# Patient Record
Sex: Male | Born: 1970 | Race: White | Hispanic: No | Smoking: Current every day smoker
Health system: Southern US, Community
[De-identification: ages and names within clinical notes are randomized; demographics above are authoritative.]

## PROBLEM LIST (undated history)

## (undated) DIAGNOSIS — M1990 Unspecified osteoarthritis, unspecified site: Secondary | ICD-10-CM

## (undated) DIAGNOSIS — M549 Dorsalgia, unspecified: Secondary | ICD-10-CM

## (undated) DIAGNOSIS — Z8739 Personal history of other diseases of the musculoskeletal system and connective tissue: Secondary | ICD-10-CM

## (undated) DIAGNOSIS — G8929 Other chronic pain: Secondary | ICD-10-CM

## (undated) DIAGNOSIS — Z981 Arthrodesis status: Secondary | ICD-10-CM

## (undated) HISTORY — DX: Other chronic pain: G89.29

## (undated) HISTORY — DX: Personal history of other diseases of the musculoskeletal system and connective tissue: Z87.39

## (undated) HISTORY — DX: Arthrodesis status: Z98.1

## (undated) HISTORY — PX: SPINAL FUSION: SHX223

## (undated) HISTORY — DX: Dorsalgia, unspecified: M54.9

---

## 2003-01-24 ENCOUNTER — Emergency Department (HOSPITAL_COMMUNITY): Admission: EM | Admit: 2003-01-24 | Discharge: 2003-01-24 | Payer: Self-pay | Admitting: Internal Medicine

## 2003-11-07 ENCOUNTER — Emergency Department (HOSPITAL_COMMUNITY): Admission: EM | Admit: 2003-11-07 | Discharge: 2003-11-07 | Payer: Self-pay | Admitting: Emergency Medicine

## 2004-02-05 ENCOUNTER — Emergency Department (HOSPITAL_COMMUNITY): Admission: EM | Admit: 2004-02-05 | Discharge: 2004-02-05 | Payer: Self-pay | Admitting: Emergency Medicine

## 2004-03-15 ENCOUNTER — Encounter: Admission: RE | Admit: 2004-03-15 | Discharge: 2004-03-27 | Payer: Self-pay | Admitting: Emergency Medicine

## 2004-03-17 ENCOUNTER — Emergency Department (HOSPITAL_COMMUNITY): Admission: EM | Admit: 2004-03-17 | Discharge: 2004-03-17 | Payer: Self-pay | Admitting: Emergency Medicine

## 2004-04-15 ENCOUNTER — Emergency Department (HOSPITAL_COMMUNITY): Admission: EM | Admit: 2004-04-15 | Discharge: 2004-04-15 | Payer: Self-pay | Admitting: Emergency Medicine

## 2004-09-12 ENCOUNTER — Ambulatory Visit (HOSPITAL_COMMUNITY): Admission: RE | Admit: 2004-09-12 | Discharge: 2004-09-12 | Payer: Self-pay | Admitting: Family Medicine

## 2004-10-23 ENCOUNTER — Ambulatory Visit: Payer: Self-pay | Admitting: Orthopedic Surgery

## 2004-11-10 ENCOUNTER — Encounter: Admission: RE | Admit: 2004-11-10 | Discharge: 2004-11-10 | Payer: Self-pay | Admitting: Orthopedic Surgery

## 2004-11-24 ENCOUNTER — Encounter: Admission: RE | Admit: 2004-11-24 | Discharge: 2004-11-24 | Payer: Self-pay | Admitting: Orthopedic Surgery

## 2004-12-04 ENCOUNTER — Ambulatory Visit: Payer: Self-pay | Admitting: Orthopedic Surgery

## 2004-12-19 ENCOUNTER — Encounter: Admission: RE | Admit: 2004-12-19 | Discharge: 2004-12-19 | Payer: Self-pay | Admitting: Neurosurgery

## 2005-01-22 ENCOUNTER — Inpatient Hospital Stay (HOSPITAL_COMMUNITY): Admission: RE | Admit: 2005-01-22 | Discharge: 2005-01-23 | Payer: Self-pay | Admitting: Neurosurgery

## 2005-03-06 ENCOUNTER — Encounter: Admission: RE | Admit: 2005-03-06 | Discharge: 2005-03-06 | Payer: Self-pay | Admitting: Neurosurgery

## 2005-04-10 ENCOUNTER — Encounter: Admission: RE | Admit: 2005-04-10 | Discharge: 2005-04-10 | Payer: Self-pay | Admitting: Neurosurgery

## 2005-05-17 ENCOUNTER — Encounter (HOSPITAL_COMMUNITY): Admission: RE | Admit: 2005-05-17 | Discharge: 2005-06-16 | Payer: Self-pay | Admitting: Neurosurgery

## 2005-11-03 ENCOUNTER — Emergency Department (HOSPITAL_COMMUNITY): Admission: EM | Admit: 2005-11-03 | Discharge: 2005-11-03 | Payer: Self-pay | Admitting: Emergency Medicine

## 2007-05-30 ENCOUNTER — Ambulatory Visit (HOSPITAL_COMMUNITY)
Admission: RE | Admit: 2007-05-30 | Discharge: 2007-05-30 | Payer: Self-pay | Admitting: Physical Medicine and Rehabilitation

## 2007-07-21 ENCOUNTER — Emergency Department (HOSPITAL_COMMUNITY): Admission: EM | Admit: 2007-07-21 | Discharge: 2007-07-21 | Payer: Self-pay | Admitting: Emergency Medicine

## 2007-12-30 ENCOUNTER — Ambulatory Visit (HOSPITAL_COMMUNITY): Admission: RE | Admit: 2007-12-30 | Discharge: 2007-12-30 | Payer: Self-pay | Admitting: Internal Medicine

## 2008-02-05 ENCOUNTER — Encounter
Admission: RE | Admit: 2008-02-05 | Discharge: 2008-05-05 | Payer: Self-pay | Admitting: Physical Medicine & Rehabilitation

## 2008-03-30 ENCOUNTER — Ambulatory Visit: Payer: Self-pay | Admitting: Physical Medicine & Rehabilitation

## 2008-04-12 ENCOUNTER — Ambulatory Visit: Payer: Self-pay | Admitting: Physical Medicine & Rehabilitation

## 2008-05-07 ENCOUNTER — Encounter
Admission: RE | Admit: 2008-05-07 | Discharge: 2008-08-05 | Payer: Self-pay | Admitting: Physical Medicine & Rehabilitation

## 2008-05-31 ENCOUNTER — Ambulatory Visit: Payer: Self-pay | Admitting: Physical Medicine & Rehabilitation

## 2008-06-21 ENCOUNTER — Ambulatory Visit: Payer: Self-pay | Admitting: Physical Medicine & Rehabilitation

## 2008-07-15 ENCOUNTER — Ambulatory Visit: Payer: Self-pay | Admitting: Physical Medicine & Rehabilitation

## 2008-07-22 ENCOUNTER — Encounter
Admission: RE | Admit: 2008-07-22 | Discharge: 2008-09-14 | Payer: Self-pay | Admitting: Physical Medicine & Rehabilitation

## 2008-08-09 ENCOUNTER — Encounter
Admission: RE | Admit: 2008-08-09 | Discharge: 2008-09-01 | Payer: Self-pay | Admitting: Physical Medicine & Rehabilitation

## 2008-08-10 ENCOUNTER — Ambulatory Visit: Payer: Self-pay | Admitting: Physical Medicine & Rehabilitation

## 2008-08-23 ENCOUNTER — Ambulatory Visit: Payer: Self-pay | Admitting: Physical Medicine & Rehabilitation

## 2009-02-05 ENCOUNTER — Emergency Department (HOSPITAL_COMMUNITY): Admission: EM | Admit: 2009-02-05 | Discharge: 2009-02-05 | Payer: Self-pay | Admitting: Emergency Medicine

## 2009-02-20 ENCOUNTER — Emergency Department (HOSPITAL_COMMUNITY): Admission: EM | Admit: 2009-02-20 | Discharge: 2009-02-20 | Payer: Self-pay | Admitting: Emergency Medicine

## 2009-04-07 ENCOUNTER — Encounter
Admission: RE | Admit: 2009-04-07 | Discharge: 2009-07-06 | Payer: Self-pay | Admitting: Physical Medicine & Rehabilitation

## 2009-04-11 ENCOUNTER — Ambulatory Visit: Payer: Self-pay | Admitting: Physical Medicine & Rehabilitation

## 2009-04-22 ENCOUNTER — Ambulatory Visit: Payer: Self-pay | Admitting: Physical Medicine & Rehabilitation

## 2009-05-16 ENCOUNTER — Ambulatory Visit: Payer: Self-pay | Admitting: Physical Medicine & Rehabilitation

## 2009-06-13 ENCOUNTER — Ambulatory Visit: Payer: Self-pay | Admitting: Physical Medicine & Rehabilitation

## 2009-07-06 ENCOUNTER — Emergency Department (HOSPITAL_COMMUNITY): Admission: EM | Admit: 2009-07-06 | Discharge: 2009-07-06 | Payer: Self-pay | Admitting: Emergency Medicine

## 2009-07-08 ENCOUNTER — Encounter
Admission: RE | Admit: 2009-07-08 | Discharge: 2009-09-14 | Payer: Self-pay | Admitting: Physical Medicine & Rehabilitation

## 2009-07-21 ENCOUNTER — Ambulatory Visit: Payer: Self-pay | Admitting: Physical Medicine & Rehabilitation

## 2009-08-18 ENCOUNTER — Ambulatory Visit: Payer: Self-pay | Admitting: Physical Medicine & Rehabilitation

## 2009-09-21 ENCOUNTER — Encounter
Admission: RE | Admit: 2009-09-21 | Discharge: 2009-12-20 | Payer: Self-pay | Admitting: Physical Medicine & Rehabilitation

## 2009-09-22 ENCOUNTER — Ambulatory Visit: Payer: Self-pay | Admitting: Physical Medicine & Rehabilitation

## 2009-11-03 ENCOUNTER — Ambulatory Visit: Payer: Self-pay | Admitting: Physical Medicine & Rehabilitation

## 2009-11-28 ENCOUNTER — Ambulatory Visit: Payer: Self-pay | Admitting: Physical Medicine & Rehabilitation

## 2010-01-04 ENCOUNTER — Encounter
Admission: RE | Admit: 2010-01-04 | Discharge: 2010-04-04 | Payer: Self-pay | Source: Home / Self Care | Admitting: Physical Medicine & Rehabilitation

## 2010-01-05 ENCOUNTER — Ambulatory Visit: Payer: Self-pay | Admitting: Physical Medicine & Rehabilitation

## 2010-03-09 ENCOUNTER — Ambulatory Visit: Payer: Self-pay | Admitting: Physical Medicine & Rehabilitation

## 2010-03-10 ENCOUNTER — Ambulatory Visit (HOSPITAL_COMMUNITY)
Admission: RE | Admit: 2010-03-10 | Discharge: 2010-03-10 | Payer: Self-pay | Admitting: Physical Medicine & Rehabilitation

## 2010-04-12 ENCOUNTER — Encounter
Admission: RE | Admit: 2010-04-12 | Discharge: 2010-07-11 | Payer: Self-pay | Source: Home / Self Care | Admitting: Physical Medicine & Rehabilitation

## 2010-04-14 ENCOUNTER — Ambulatory Visit: Payer: Self-pay | Admitting: Physical Medicine & Rehabilitation

## 2010-04-22 ENCOUNTER — Emergency Department (HOSPITAL_COMMUNITY): Admission: EM | Admit: 2010-04-22 | Discharge: 2010-04-22 | Payer: Self-pay | Admitting: Emergency Medicine

## 2010-05-16 ENCOUNTER — Ambulatory Visit: Payer: Self-pay | Admitting: Physical Medicine & Rehabilitation

## 2010-05-18 ENCOUNTER — Ambulatory Visit (HOSPITAL_COMMUNITY)
Admission: RE | Admit: 2010-05-18 | Discharge: 2010-05-18 | Payer: Self-pay | Admitting: Physical Medicine & Rehabilitation

## 2010-06-01 ENCOUNTER — Ambulatory Visit (HOSPITAL_COMMUNITY)
Admission: RE | Admit: 2010-06-01 | Discharge: 2010-06-01 | Payer: Self-pay | Source: Home / Self Care | Admitting: Family Medicine

## 2010-10-07 ENCOUNTER — Encounter: Payer: Self-pay | Admitting: Family Medicine

## 2010-10-08 ENCOUNTER — Encounter: Payer: Self-pay | Admitting: Internal Medicine

## 2010-10-08 ENCOUNTER — Encounter: Payer: Self-pay | Admitting: Orthopedic Surgery

## 2010-11-22 ENCOUNTER — Emergency Department (HOSPITAL_COMMUNITY)
Admission: EM | Admit: 2010-11-22 | Discharge: 2010-11-22 | Disposition: A | Discharge status: Home / Self Care | Payer: Medicare Other | Attending: Emergency Medicine | Admitting: Emergency Medicine

## 2010-11-22 DIAGNOSIS — G8929 Other chronic pain: Secondary | ICD-10-CM | POA: Insufficient documentation

## 2010-11-22 DIAGNOSIS — M436 Torticollis: Secondary | ICD-10-CM | POA: Insufficient documentation

## 2010-11-22 DIAGNOSIS — M549 Dorsalgia, unspecified: Secondary | ICD-10-CM | POA: Insufficient documentation

## 2010-12-21 LAB — RAPID STREP SCREEN (MED CTR MEBANE ONLY): Streptococcus, Group A Screen (Direct): NEGATIVE

## 2010-12-25 LAB — RAPID URINE DRUG SCREEN, HOSP PERFORMED
Amphetamines: NOT DETECTED
Barbiturates: NOT DETECTED
Benzodiazepines: POSITIVE — AB
Cocaine: NOT DETECTED
Opiates: POSITIVE — AB
Tetrahydrocannabinol: NOT DETECTED

## 2010-12-25 LAB — BASIC METABOLIC PANEL
BUN: 14 mg/dL (ref 6–23)
Chloride: 104 mEq/L (ref 96–112)
GFR calc non Af Amer: >60 mL/min (ref >60)
Glucose, Bld: 130 mg/dL — ABNORMAL HIGH (ref 70–99)
Potassium: 3.7 mEq/L (ref 3.5–5.1)

## 2010-12-25 LAB — ETHANOL: Alcohol, Ethyl (B): <5 mg/dL (ref 0–10)

## 2011-01-30 NOTE — Assessment & Plan Note (Signed)
A 40 year old male with prior history of lumbar fusion, L5-S1 in 2006,  chronic postoperative pain right lower extremity, as well as across the  back.  I saw him last on April 11, 2009.  My previous visit with him was  in December 2009 for caudal epidural, which was not particularly  helpful.  I reviewed his imaging studies last done in April 2009.  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  He has had thoracic  MRI, which was negative in the past.  His pain is mainly in the L4  dermatome on the right side compared to left side.   I was able to look up some ED visits indicating he was referred for  detox for narcotic abuse.  The patient states he just wanted to come off  the medications and see if something else might work for his pain.   His pain score is 4-5/10, on an average 5-6 currently, and this is  really without pain medicine, bit higher than last visit where he was 3-  4.  We trialed him on some nortriptyline 10 mg nightly and he took up to  4 at night without a whole lot of improvements.  I did explain to him  that he is not to take more than what he was prescribed given that it  can cause arrhythmias and that also it takes about a week at least to  get stable on a particular dosage.   REVIEW OF SYSTEMS:  Positive for trouble walking, spasms, tingling, poor  appetite.  His walking tolerance is 30-45 minutes.  He climbs steps.  He  drives.   SOCIAL HISTORY:  He lives with his wife, smokes a pack per day.   PHYSICAL EXAMINATION:  VITAL SIGNS:  Blood pressure 120/79, pulse 91,  respirations 18, O2 sat 99% on room air.  GENERAL:  Well-developed, well-nourished male, appears tired, but  otherwise no acute distress.  Orientation x3.  MUSCULOSKELETAL:  Gait is forward flexed, kyphotic appearing, although  he can straight now with cuing.  He has some tenderness along his  paraspinals, thoracic, as well as lumbar.  His motor strength is full,  bilateral lower  extremities.  Straight leg raise is mildly positive,  right lower extremity.  Deep tendon reflexes are normal; however, at the  knee and the ankle.  Hip range of motion mildly reduced, but this is  mainly because of some back pain with internal and external rotation.  EXTREMITIES:  Without edema and warm.  No evidence of erythema.   IMPRESSION:  1. 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  He      has anxiety and other psychological distress, but no suicidal      thoughts.  I explained that it is unlikely that any particular      intervention is going to give him full pain relief.  Emphasized      multimodal approach, also discussed his prior admission for      narcotic detoxification.  He states that really was not having      problems controlling, but rather he just wanted to come off them at      that time.  He feels like he does better functionally while on pain      medications.   I have indicated that I would not restart any of his prior medication  such as oxycodone or morphine, would be willing to trial a newer agents  such as Nucynta  given he has both a neuropathic as well as a mechanical  source of pain.  We will start with 50 mg t.i.d., but I have indicated  that I may have to slowly increase on that.   1. In terms of his sleep disturbance, as well as to help with some      neuropathic pain, we will start him on nortriptyline 25 nightly.  I      have indicated that he is not to take more than the prescribed      dosage.   He has an appointment already for epidural steroid injection on May 16, 2009, at 9:30 a.m.  The patient understands the treatment objectives  and agrees with plan.      Erick Colace, M.D.  Electronically Signed     AEK/MedQ  D:  04/22/2009 14:26:26  T:  04/23/2009 01:43:50  Job #:  [id number]   cc:   Madelin Rear. Sherwood Gambler, MD  Fax: [phone]   Donalee Citrin, M.D.  Fax: [phone]

## 2011-01-30 NOTE — Assessment & Plan Note (Signed)
The patient is a 40 year old male who has a history of lumbar fusion L5-  S1 in 2006.  He has had chronic postoperative pain since that time.  He  has had postoperative PT.  He has had medication management with  narcotic analgesics.  He states that he has not been taking any narcotic  analgesics for the last month or so.  I have not seen him for 7-8  months.  He indicates that he could not afford copays, started seeing  his primary physician, and is now no longer seeing his primary physician  for narcotic analgesics.  He does not give any details of what happened  and why Dr. Sherwood Gambler is no longer prescribing his oxycodone.   The patient also has questions regarding nerve blocks and epidural  injections.  He did have an epidural in December 2009 in the caudal  level which was not particularly helpful.  He complains of pain  primarily in the right thigh.  His right thigh pain is worse with his  dog sitting on his lap.  He has had no other medical problems per his  report in the interval time.  He no longer takes Cymbalta, has taken  some Valium recently.  He would like to start taking his narcotic  analgesics again because he feels like it made him more functional.  His  last ED visit is detox from narcotic abuse.  In June 2010, the patient  did not mention this at all to me.  He had a substance abuse referral.   This history was obtained after the patient actually left the office.  His pain level is 3-4, level currently 5.  Sleep is poor.  He has had  some numbness, tingling, trouble walking, spasms, depression.  No  suicidal thoughts.   SOCIAL HISTORY:  Married, lives with his wife.   PHYSICAL EXAMINATION:  VITAL SIGNS:  Blood pressure 125/73, pulse 87,  respirations 18, O2 sat 97% on room air.  GENERAL:  Orientation x3.  Affect is bright and alert.  Gait is with  limp.  No acute distress.  PSYCHIATRIC:  Mood and affect appropriate.  EXTREMITIES:  Lower extremity strength is normal.   Deep tendon reflexes  are 1+ bilateral lower extremities.  He has decreased sensation in the  right L3, 4, and 5 dermatomes compared to the left side.  Gait shows no  evidence of toe drag or knee instability.  I reviewed his imaging  studies, last one done in April 2009 were by Dr. Sherwood Gambler showed no  recurrent disk herniation and scarring around the thecal sac S1 nerve  root sleeve.   IMPRESSION:  1. Lumbar postlaminectomy syndrome.  He has radicular pain mostly on      the right side, has been on bilateral sides and sometimes on the      left side.  2. Thoracic pain, has had workup with thoracic MRI which has been      negative.  I think this is mainly myofascial.  3. History of narcotic abuse in the past, certainly high-risk patient      in terms of further narcotic analgesics potentially try atypical      agents such as Nucynta.  For now, we will start him on some      nortriptyline 10 mg at bedtime.  We may need to see him with his      wife who is a Engineer, civil (consulting).   We will set him up for epidural right L4  transforaminal next visit.      Erick Colace, M.D.  Electronically Signed     AEK/MedQ  D:  04/11/2009 10:49:16  T:  04/12/2009 00:51:05  Job #:  [id number]   cc:   Madelin Rear. Sherwood Gambler, MD  Fax: [phone]   Donalee Citrin, M.D.  Fax: [phone]

## 2011-01-30 NOTE — Assessment & Plan Note (Signed)
The patient returns today.  He was seen in initial consultation on March 30, 2008.  I have reviewed his urine drug screen and discussed with him  and his wife, oxycodone is positive as expected, he is taking a 180 mg  per day.  In the interval time, he has gotten medicines from Dr. Sherwood Gambler  again and has almost a month's supply left.   His pain is still in the 9-10/10 range, constant aching, dull in lower  extremities as well as buttock area.  He can walk 30-45 minutes at a  time.  He climbs steps.  He drives.  Needs assistance with meal prep,  household duties, and shopping.   REVIEW OF SYSTEMS:  Positive for numbness, tingling, trouble walking,  depression, spasms, coughing, and wheezing.   SOCIAL HISTORY:  Married, lives with his wife.  Smokes a pack and half  per day.   Other medications trialed, Flector patch was helpful for his back and  the upper lumbar.  He does get some relief with cracking his back by  lying down and extension and this relieves his pain as does the Flector  patch.   IMPRESSION:  1. 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  We will do a sacroiliac      injection today.  2. Lumbar axial pain.  This may be facet mediated.  In addition, he      has some lower extremity pain which may be from epidural fibrosis      and may benefit from caudal epidural steroid injection.   PLAN:  1. We will continue his Flector patch.  2. No medications for now, but we may switch to a longer acting agent      next month such as MS Contin.  He is currently taking oxycodone 30      mg 6 times per day.      Erick Colace, M.D.  Electronically Signed     AEK/MedQ  D:  04/12/2008 11:18:43  T:  04/13/2008 01:20:06  Job #:  [id number]

## 2011-01-30 NOTE — Assessment & Plan Note (Signed)
This 40 year old male who is planning post-operative pain following a L5-  S1 spinal fusion in 2006.  He was here for caudal epidural steroid  injection; however, he has a new complaint.   CHIEF COMPLAINT:  Left-sided thoracic pain.  He has pain in the area  between the shoulder blade in his mid back area.  He denies any history  of trauma, really has not been very physically active.  He states that  the pain does not radiate into the arm or down the legs.  He has not  lost any bowel or bladder function.   CURRENT MEDICATIONS:  1. Oxycodone 30 mg 5 tablets per day.  2. Cymbalta 60 mg at bedtime.  3. Cyclobenzaprine 10 mg at bedtime.   On examination, he has tenderness along the scapular border on the left  side only and also pain along the trapezius/rhomboid area between T5 and  T7.  He has some pain with pushing out with this arms.  Otherwise, his  upper extremity strength is 5/5 in deltoid, biceps, triceps, and grip.   The extremity strength is normal.  Gait is normal.   IMPRESSION:  1. Scapular or thoracic strain.  We will send him for physical therapy      for this.  I do not think any escalation of narcotic analgesics is      necessary.  It has been going on for about two weeks.  I have      instructed him to do some home exercises including laying on a      tennis ball and rolling on that.  He can use anti-inflammatories      such as Aleve or Ibuprofen as well.  He has been using heat which      is appropriate.  2. Post laminectomy syndrome, chronic post-operative pain.  We will do      caudal epidural steroid injection today.  I will see him in 2 to 3      weeks for follow up visit to see how he is doing with therapy.      Erick Colace, M.D.  Electronically Signed     AEK/MedQ  D:  08/23/2008 13:54:47  T:  08/24/2008 07:36:10  Job #:  [id number]

## 2011-01-30 NOTE — Procedures (Signed)
[name], [name]               ACCOUNT NO.:  [id number]   MEDICAL RECORD NO.:  [id number]         PATIENT TYPE:  [facility]   LOCATION:                                 FACILITY:   PHYSICIAN:  Erick Colace, M.D.DATE OF BIRTH:  11/13/1970   DATE OF PROCEDURE:  06/21/2008  DATE OF DISCHARGE:                               OPERATIVE REPORT   This is bilateral L1, L2, L3 medial branch blocks under fluoroscopic  guidance.   INDICATIONS:  Chronic postoperative pain with lumbar spondylosis status  post L5-S1 fusion.   Pain is only partially responsive to medication management including  narcotic analgesics that are fairly high dosages which interferes with  self-care and mobility.  Oswestry disability index has been in the  severe range of 66% today.   He had good response for about 3 days of 50% relief after the prior  bilateral medial branch blocks.   Informed consent obtained after describing risks and benefits of the  procedure with the patient.  These include bleeding, bruising,  infection.  He elects to proceed and has given written consent.  The  patient was placed prone on fluoroscopy table.  Betadine prep, sterile  drape.  A 25-gauge inch and a half needle was used to anesthetize the  skin and subcutaneous tissue, 1% lidocaine x2 mL, and a 22-gauge 3-1/2-  inch spinal needle was inserted under fluoroscopic guidance targeting  the left L4 SAP transverse process junction, bone contact made,  confirmed with lateral imaging.  Omnipaque 180 x0.5 mL demonstrated no  intravascular uptake and 0.5 mL solution containing 1 mL of 4 mg/mL  dexamethasone and 2 mL of 2% MPF lidocaine.  Then, the left L3 SAP  transverse process junction targeted, bone contact made, confirmed with  lateral imaging.  Omnipaque 180 x0.5 mL demonstrated no intravascular  uptake and 0.5 mL dexamethasone lidocaine solution was injected.  Then,  the left L2 SAP transverse process junction targeted, bone contact  made,  confirmed with lateral imaging.  Omnipaque 180 x0.5 mL demonstrated no  intravascular uptake and 0.5 mL of dexamethasone lidocaine solution was  injected.  Same procedure was repeated on the right side at the  corresponding levels using the same needle technique and injectate.  The  patient tolerated the procedure well.  Pre and postinjection vitals  stable.  Depending on response to these injections, we will proceed on  to medial branch blocks, and if not consider epidural steroid injection  versus intra-articular injection.       Erick Colace, M.D.  Electronically Signed     AEK/MEDQ  D:  06/21/2008 12:35:44  T:  06/22/2008 01:32:37  Job:  [id number]

## 2011-01-30 NOTE — Assessment & Plan Note (Signed)
Mr. Mier returns today.  He underwent bilateral L1, L2, and L3 medial  branch blocks which was confirmatory set on June 21, 2008.  He had 50%  relief for about 3 days when I did the same levels in September 2009;  however, this time it did not seem to help.  He is a 40 year old male  status post L5-S1 fusion in 2006 and had a long history of back pain  prior to his fusion.  He has had urine drug screen which was consistent  with recorded medications.  He is also trialed on sacroiliac injections  which did not produce any significant relief of his symptoms.   We have had him on Voltaren gel which has been somewhat helpful.  He  still takes diclofenac p.o. and I have told him to stop that today.  He  takes Cymbalta 60 mg per day per his report.   He has been on oxycodone for a long period of time, taking 30 mg 5 times  per day.   He does have 24-hour per day pain.  His sleep is fair.  His pain does  interfere with sleep about 4 hours at a time before he wakes up.  He is  only on short-acting medications.   His Oswestry disability index score 56%, which is better than last visit  however.   PHYSICAL EXAMINATION:  GENERAL:  Anxious appearing male, in no acute  distress.  Mood and affect appropriate; otherwise, his ambulation is  without evidence of toe drag or knee instability.  He has short step  length, forward flex at the hips.  Usually, he wears back brace when he  takes it off.  He has tenderness even with light palpation in the lumbar  area.  No skin breakdown.  No rashes.  No redness or swelling in the low  back area.  EXTREMITIES:  He has normal strength in bilateral lower extremities.  He  has normal deep tendon reflexes in bilateral lower extremities.  His  range of motion is normal in bilateral lower extremities.  His  extremities have no evidence of edema.   IMPRESSION:  1. Lumbar postlaminectomy pain syndrome.  He has quite a bit of      stiffness in the lumbar spine  about 25% range of motion and has      fear-avoidance patterns in terms of his movement in the lumbar      spine area.  I do think he would benefit from aquatic exercise.  He      does not have a local PT that does aquatic exercise, but does have      [employer] that has aquatic exercise classes as a group.  I have      recommended this.  We will send him to PT for trial of a TENS unit.  2. We will also rotate his narcotic analgesics switching him to MS      Contin 60 mg p.o. b.i.d.  This will actually represent 33-50%      dosage reduction, but this would be part of safety factor in terms      of switching agents.  He may have to go up from there slightly.  3. In terms of injections, I do not think any further injection is      needed at this time.  Only other potential injection that I would      think would be helpful would be a caudal epidural, particularly for  lower extremity discomfort that he has primarily in the left      posterior leg and right anterior thigh.  4. He will come back in 1 month for reevaluation and see how he does      on his new treatment regimen as well as how he has done on a TENS      unit.      Erick Colace, M.D.  Electronically Signed    AEK/MedQ  D:  07/15/2008 14:46:35  T:  07/16/2008 01:44:01  Job #:  [id number]   cc:   Madelin Rear. Sherwood Gambler, MD  Fax: [phone]

## 2011-01-30 NOTE — Group Therapy Note (Signed)
CONSULT REQUESTED BY:  Madelin Rear. Fusco, MD for the evaluation of  chronic back pain.   HISTORY:  The patient is a 40 year old male who has a history of back  pain dating back to 69 or 1999.  He had no discrete injury.  He  subsequently developed left greater than right lower extremity pain.  We  did orthopedic evaluation, found to have a diffuse degenerative disk  with mild facet disease at L5-S1 and sent for epidural which did not  have any particularly beneficial effect per the patient's report, I did  not see follow up from that in 2006.  He had an MRI in 2005, which  demonstrated slight retrolisthesis at L5 on S1, and disk bulging into  the neural foramina without discrete impingement.  Neurosurgery notes  from December 19, 2004, indicates S1 radiculopathy with no loss of strength.  He had marked foraminal stenosis.  He has had some physical therapy  preoperatively, but then underwent L5-S1 fusion.   He underwent decompressive laminectomy, posterior lumbar interbody  fusion at L5-S1, pedicle screw fixation on Jan 22, 2005.  Initial  followup notes in May 2006, indicates he was doing okay.  He was placed  on lifting restrictions of 10 pounds in June 2006.  He was placed  postoperatively on oxycodone as well as diclofenac and Flexeril.  He  switched from oxycodone to Vicodin about 6 months postop.  He had a  repeat MRI of lumbar spine in 2007 in [facility].  Postop  changes were seen in L5-S1.  No other disk levels were noted to be  abnormal.  He has had an MRI of the thoracic spine on November 23, 2005,  which was normal.  He had a repeat lumbar MRI with and without contrast  showing satisfactory pedicle screw interbody fusion at L5-S1.  Mild  lumbar degenerative changes present at all levels.  Mild facet  arthropathy noted at L4-5.  Reviewed notes from [facility] dating back from 2006.  He is on escalating dosages of  oxycodone starting at 5 mg working up to 15  and then up to 30 mg at a  time.  He has had insomnia due to pain, placed on Valium at night.  He  was given 150 tablets a month and oxycodone 15 in December 2007,  increased to 30 mg 120 per month in January 2008.  He has had request  for early refill in June 2008, referred to Dr. Ethelene Hal' office in August  2008.  Meds were still on December 08, 2007.  He went up to 180 tablets and  oxycodone 30 mg and added Duragesic 25 mcg.  He states that he did not  like the Duragesic, it fell off when he showered.   He has not tried any injections postoperatively.  He had some postop PT  consist with some __________ bending, stretching, exercises, and  treadmill.   ALLERGIES:  TORADOL.   PAST MEDICAL HISTORY:  Significant for arthritis.  No other major  illnesses.   CURRENT MEDICATIONS:  1. Cymbalta 30 mg a day.  2. Diclofenac 75 b.i.d.  3. Valium 10 at bedtime.  4. Oxycodone total of 180 mg per day.   He has a hard time getting started in the morning, feels particularly  painful in the morning both in back and the lower extremities.  Pain  interferes with activity on a 9 out of 10 level, relationship with  others at 7/10, enjoyment of life at  8.  Pain is described as burning,  dull, constant, and aching.  Pain is worse during the evening hours.  Sleep is fair.  Pain is worse with walking, bending, sitting, activity,  and standing; improves with rest, heat, and medications.  Walks 20 to 30  minutes at a time.  He climbs steps.  He drives.  Needs assistance with  meal prep, household duties, and shopping.   REVIEW OF SYSTEMS:  Positive for numbness, tremor, trouble walking,  spasms, and depression.   SOCIAL HISTORY:  Married.  Lives with his wife.  His wife is a Engineer, civil (consulting) in  the [employer] and states that she controls his  medications.   PHYSICAL EXAMINATION:  VITAL SIGNS:  His blood pressure is 126/76, pulse  84, respiratory rate 20, and O2 sat 97% on room air.  GENERAL:  A  well-developed, well-nourished male in no acute distress.  He does walk shuffling his feet.  No evidence of toe drag or knee  instability.  He is able to toe walk and heel walk.  His affect appears  depressed.  EXTREMITIES:  Without edema.  Coordination is normal.  Deep tendon  reflexes are normal.  Sensation is mildly reduced at bilateral S1  dermatomes.  His motor strength is 5/5 in the bilateral deltoid, biceps,  triceps, and grip, both hip flexion, knee extension, and ankle  dorsiflexion.  Lumbar spine range of motion 25%  forward flexion and  extension, lateral rotation and bending.  Neck range of motion is 75%  forward flexion, extension, lateral rotation, and bending.  Upper and  lower extremity range of motion normal with the exception of hip  external rotation in the left side, is a bit tight, and has pain in the  groin __________ on the left side.   IMPRESSION:  1. Lumbar postlaminectomy syndrome and chronic postoperative pain.  We      discussed various pain generators.  He does get some relief with      extension which suggests perhaps some other disk that might be      painful.  Alternatively, he may have some sacroiliac-type pain to      account for his axial pain.  2. Lumbar radicular pain.  His last MRI performed on December 30, 2007,      showed some scarring around the thecal sac.  He may have some      epidural fibrosis.   We discussed treatment options including sacroiliac injections, lumbar  selective nerve root blocks, and failing this consideration for spinal  cord stimulation or lumbar epidural adhesiolysis.   Also, discussed the need for increasing activity levels.  PT for  reestablishing at least the community exercise program perhaps first  getting him into a pool might be helpful as well.   We discussed the narcotic analgesic medications including switch to  longer acting medications.  I would consider Morphine SR 60 mg b.i.d.  which would actually  represent a dosage __________ about 33%-50%.  We  will also increase his Cymbalta to 60 mg per day.   I would favor him coming out the Valium as well.  He need something for  sleep, we would try something along the line such as nortriptyline.      Erick Colace, M.D.  Electronically Signed     AEK/MedQ  D:  03/30/2008 17:08:09  T:  03/31/2008 06:25:18  Job #:  [id number]

## 2011-01-30 NOTE — Procedures (Signed)
NAMELAVELL, [name]               ACCOUNT NO.:  [id number]   MEDICAL RECORD NO.:  [id number]          PATIENT TYPE:  REC   LOCATION:  [facility]                          FACILITY:  [facility]   PHYSICIAN:  Erick Colace, M.D.DATE OF BIRTH:  10-Sep-1971   DATE OF PROCEDURE:  DATE OF DISCHARGE:                               OPERATIVE REPORT   This is bilateral sacroiliac injections under fluoroscopic guidance.   INDICATION:  Low-back buttock pain radiating into hips, only partial  response to medication management.  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   Informed consent was obtained after describing the risks and benefits of  the procedure with the patient including bleeding, bruising, infection,  loss of bowel or bladder function, temporary or permanent paralysis.  He  elected to proceed and has given written consent.  The patient was  placed prone on the fluoroscopy table.  Betadine prep, sterile drape.  A  25-gauge 1-1/2-needle was used to anesthetize the skin and subcu tissue  with 1% lidocaine x2 mL.  Omnipaque 180 x0.5 mL demonstrated no  intravascular uptake.  Then, solution containing 0.5 mL of 40 mg/mL Depo-  Medrol plus 0.5 mL of 2% MPF lidocaine injected to the right SI joint.  The same procedure was repeated on the left side with the same needle,  injectate, and technique.  The patient tolerated the procedure well.  Pre- and post-injection vitals stable.  Post-injection instructions  given.  Recording of pain level discussed with the patient and depending  on the result from this injection, we will move on to either repeat  versus lumbar facets above the level of fusion.      Erick Colace, M.D.  Electronically Signed     AEK/MEDQ  D:  04/12/2008 11:15:46  T:  04/13/2008 01:38:10  Job:  [id number]   cc:   Madelin Rear. Sherwood Gambler, MD  Fax: [phone]

## 2011-01-30 NOTE — Procedures (Signed)
[name], [name]               ACCOUNT NO.:  [id number]   MEDICAL RECORD NO.:  [id number]         PATIENT TYPE:  [facility]   LOCATION:                                 FACILITY:   PHYSICIAN:  Erick Colace, M.D.DATE OF BIRTH:  May 14, 1971   DATE OF PROCEDURE:  DATE OF DISCHARGE:                               OPERATIVE REPORT   PROCEDURE:  Bilateral L1, L2, and L3 medial branch blocks under  fluoroscopic guidance.   INDICATION:  Chronic postoperative pain with lumbar spondylosis.  He is  status post L5-S1 fusion.   Pain is only partially responsive to medication management including  narcotic analgesics at fairly high dosages.  Pain interferes with self-  care and mobility.  His Oswestry disability index today is 72, which is  severe range.   Informed consent was obtained after describing risks and benefits of the  procedure with the patient.  These include bleeding, bruising,  infection, temporary or permanent paralysis.  He elects to proceed and  has given written consent.  The patient placed prone on fluoroscopy  table.  Betadine prep, sterile drape.  A 25-gauge 1-1/2-inch needle was  used to anesthetize the skin and subcu tissue with 1% lidocaine x2 mL.  Then, a 22-gauge 3-1/2-inch spinal needle was inserted first targeting  the left L4 SAP transverse junction.  Bone contact made and confirmed  with lateral imaging.  Omnipaque 180 x 0.5 mL demonstrated no  intravascular uptake and 0.5 mL of a solution containing 1 mL of 4 mg/mL  dexamethasone and 2 mL of 2% MPF lidocaine were injected in the L3 SAP  transverse process junction.  Targeted bone contact made and confirmed  with lateral imaging.  Omnipaque 180 x 0.5 mL demonstrated no  intravascular uptake.  Then, 0.5 mL of dexamethasone lidocaine solution  was injected in the left L2 SAP transverse process junction.  Targeted  bone contact made and confirmed with lateral imaging.  Omnipaque 180 x  0.5 mL demonstrated no  intravascular uptake and 0.5 mL of dexamethasone  lidocaine solution injected.  Same procedure was repeated on the right  side at the corresponding levels using same needle, technique, and  injectate.  The patient tolerated the procedure well.  Pre and post-  injection vitals stable.  Post-injection instructions given.  Pre-  injection pain level 10/10, post injection 8/10.  We will track over  time.  Consider L4-L5 intra-articular injection  at the next visit, if  this level does not pan out.      Erick Colace, M.D.  Electronically Signed     AEK/MEDQ  D:  05/31/2008 11:51:13  T:  06/01/2008 03:00:11  Job:  [id number]

## 2011-01-30 NOTE — Procedures (Signed)
[name], [name]               ACCOUNT NO.:  [id number]   MEDICAL RECORD NO.:  [id number]           PATIENT TYPE:   LOCATION:                                 FACILITY:   PHYSICIAN:  Erick Colace, M.D.DATE OF BIRTH:  1971-07-29   DATE OF PROCEDURE:  DATE OF DISCHARGE:                               OPERATIVE REPORT   PROCEDURE:  Right L4 selective nerve root block.   INDICATIONS:  Right lower extremity radiculitis clinically at the L4  level.  Pain radiates from the back down into the buttock and lateral  thigh and lateral leg.  Pain is only partially responsive to medication  management and other conservative care.   PROCEDURE IN DETAIL:  Informed consent was obtained after describing  risks and benefits of the procedure with the patient.  These include  bleeding, bruising, infection, loss of bowel, bladder function,  temporary or permanent paralysis.  He elects to proceed and has given  written consent.  The patient was placed prone on fluoroscopy table.  Betadine prep, sterile drape, 25-gauge inch and half needle was used to  anesthetize the skin and subcutaneous tissue with 1% lidocaine x2 mL,  and a 22-gauge 3.5-inch spinal needle was inserted under fluoroscopic  guidance in the right L4-5 intervertebral foramen.  AP, lateral, and  oblique images utilized.  Omnipaque 180 under live fluoro demonstrated  good nerve root outline followed by injection of 1 mL of 10 mg/mL  dexamethasone and 2 mL of 1% lidocaine.  The patient tolerated the  procedure well.  Pre and post injection vitals stable.   POST INJECTION INSTRUCTIONS:  We will see him back in 1 month, possible  reinjection at L4, if this was only temporary, may try for a more  epidural injection versus going at the left S1 level to address the left  lower extremity pain if this is the main residual.  Pre injection was 6.  Post injection 4.  We will followup at time.      Erick Colace, M.D.  Electronically Signed     AEK/MEDQ  D:  05/16/2009 11:26:49  T:  05/17/2009 01:17:30  Job:  [id number]

## 2011-01-30 NOTE — Assessment & Plan Note (Signed)
Mr. Capistran returns today.  This is a 40 year old male who has chronic  postoperative pain following L5-S1 spinal fusion in 2006.  He had a long  history of back pain even prior to his fusion.  He has had appropriate  urine drug screen as part of his narcotic analgesic monitoring program  through our clinic.   His current pain is 3/10 but averages 9/10.  He has been trialed on L1-2  medial branch blocks, which were effective in September, but repeat  procedure in October really was not effective, therefore, radiofrequency  procedure was not pursued.  He had some previous sacroiliac injections,  which were similarly unhelpful.  He does have some pain going down the  left lower extremity, which is part of his overall pain syndrome.  He  describes pain is dull and aching and he does have numbness in the left  lower extremity.  His pain does improve with rest, heat, pacing  activities, as well as medications.   We trialed him on MS Contin, but this was less effective than his  oxycodone.   CURRENT MEDICATIONS:  1. Oxycodone 30 mg 5 tablets per day.  2. He is on Cymbalta 60 mg per day.  3. He is on cyclobenzaprine 10 mg nightly.  His wife does feel that      since coming off the diazepam, he has been doing better from      activity standpoint, does not seem to be so sedated and inactive.   He walks 15-30 minutes at a time.  He climbs step.  He drives.   SOCIAL HISTORY:  Married, accompanied by his wife who is a Engineer, civil (consulting).   PHYSICAL EXAMINATION:  VITAL SIGNS:  His blood pressure is 136/84, pulse  72, respiratory rate 18, and O2 sat 97% on room air.  GENERAL:  Well-developed, well-nourished male in no acute distress.  Orientation x3.  Affect is alert.  He is mildly anxious.  He has a  kyphotic posture.  EXTREMITIES:  Without edema.  Coordination is normal in the upper and  lower extremities.  Deep tendon reflexes are normal in the upper and  lower extremities.  His range of motion is normal  in the upper and lower  extremities.  Motor strength is 5/5 in bilateral upper and lower  extremities.   IMPRESSION:  1. 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  Certainly, he has stiffness and reduced range of motion more      than one would expect with simply an L5-S1 fusion.  He has had some      few avoidance patterns and has been quite inactive overtime.  I      have indicated that aquatic exercise would be quite good for him.   In terms of his left lower extremity pain, we will schedule him for a  caudal epidural steroid injection.  He has sometimes right-sided pain  but mostly left-sided pain.  His right-sided pain is mainly in the right  anterior thigh and his left-sided pain is mainly posterior leg.  This  would be able to treat both.  We will use higher volume.   1. Continue TENS unit, this has been helpful for him.  2. Continue current medications.  We did discuss other treatment      options including Opana, and this will not be      started over the holidays.  We will wait until the New Year to  initiate any type of changes in narcotic analgesics.      Erick Colace, M.D.  Electronically Signed     AEK/MedQ  D:  08/10/2008 16:31:46  T:  08/11/2008 04:35:47  Job #:  [id number]   cc:   Madelin Rear. Sherwood Gambler, MD  Fax: [phone]

## 2011-01-30 NOTE — Procedures (Signed)
[name], [name]               ACCOUNT NO.:  [id number]   MEDICAL RECORD NO.:  [id number]           PATIENT TYPE:   LOCATION:                                 FACILITY:   PHYSICIAN:  Erick Colace, M.D.DATE OF BIRTH:  Aug 09, 1971   DATE OF PROCEDURE:  DATE OF DISCHARGE:                               OPERATIVE REPORT   PROCEDURE:  Caudal epidural steroid injection.   INDICATIONS:  Lumbar post laminectomy syndrome with lumbosacral neuritis  and radiculitis.   Pain was only partially responsive to narcotic analgesic medications and  interferes with self-care mobility.   Informed consent was obtained after describing risks and benefits of the  procedure with the patient.  These include bleeding, bruising,  infection, loss of bowel or bladder function, temporary, permanent  paralysis, he elects to proceed and has given written consent.  The  patient placed prone on fluoroscopy table.  Betadine prep, sterile  drape, 25-gauge inch and half needle was used to anesthetize the skin  and subcutaneous tissue with 1% lidocaine x2 mL, and a 22 gauge 3.5-inch  spinal needle was inserted under fluoroscopic guidance into the sacral  hiatus.  AP and lateral imaging utilized.  Omnipaque 180 x3 mL  demonstrated good Christmas tree spread and spread of contrast up to L5  followed by injection of solution containing 2 mL of 1% MPF lidocaine, 2  mL of 44 mL Depo-Medrol, and 4 mL of 0.9% normal saline, preservative  free.   The patient tolerated the procedure well.  Pre and post injection vitals  stable.  Post injection instructions given.      Erick Colace, M.D.  Electronically Signed     AEK/MEDQ  D:  08/23/2008 13:51:48  T:  08/24/2008 03:33:07  Job:  [id number]

## 2011-02-02 NOTE — Op Note (Signed)
[name], [name]               ACCOUNT NO.:  [id number]   MEDICAL RECORD NO.:  [id number]          PATIENT TYPE:  INP   LOCATION:  3172                         FACILITY:  [facility]   PHYSICIAN:  Donalee Citrin, M.D.        DATE OF BIRTH:  1970/11/06   DATE OF PROCEDURE:  01/22/2005  DATE OF DISCHARGE:                                 OPERATIVE REPORT   PREOPERATIVE DIAGNOSES:  1.  Lumbar spinal stenosis, lateral recess stenosis L5-S1.  2.  Diskogenic mechanical low back pain L5-S1.  3.  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  4.  Bilateral S1 radiculopathies.   PROCEDURE:  1.  Decompressive laminectomy L5-S1 in excess of what would be standardly      done with a posterior lumbar interbody fusion.  2.  Posterior lumbar interbody fusion L5-S1 using 10 x 26 mm Tangent      autograft wedges.  3.  Pedicle screw fixation L5-S1 using the 6.35 Legacy pedicle screw system.  4.  Posterolateral arthrodesis L5-S1 using locally harvested autograft.  5.  Open reduction spinal deformity L5-S1 and placement of medium Hemovac      drain.   SURGEON:  Dr. Wynetta Emery.   ASSISTANT:  Pool.   ANESTHESIA:  General endotracheal.   HPI:  The patient is a very pleasant 40 year old gentleman who has had  longstanding back and bilateral leg pain, worse on the right, radiating down  to the outside of foot and bottom of the foot with numbness and tingling in  the same distribution.  The patient was refractory to all forms conservative  treatment, physical therapy, epidural steroid injections.  The patient  characterizes pain as predominantly mechanical in nature, worse when going  from a lying to sitting or sitting to standing position.  His preoperative  imaging showed retrolisthesis at L5 and on S1 with large central disk  rupture, severe biforaminal stenosis at the L5 nerve roots as well as the S1  nerve roots and flexion extension films showed dynamic movement with  subluxation and kyphosis  at L5-S1.  The patient was recommended  decompression stabilization procedure after failure of all forms of  conservative treatment.  Risks and benefits were explained.   OPERATIVE COURSE:  The patient was brought in the OR, was induced under  general anesthesia, positioned prone on the Wilson table.  Back was prepped  and draped in the usual sterile fashion.  Preoperative x-ray localizing the  L5-S1 disk space after infiltration with 10 mL of Lidocaine.  A midline  incision was made and Bovie electrocautery was used to take down the  subcutaneous tissue.  A subperiosteal incision was carried out in the lamina  of the L4-5 and S1.  TPs at L5 and S1 were identified.  Then the spinous  process and laminar complex at L5 was removed, complete medial facetectomies  were also removed.  The facet joint was noted to be markedly degenerated and  __________ .  Both the L5 and the S1 nerve roots were noted to be markedly  stenotic due to severe ligamentous hypertrophy and facet arthropathy.  This  was all teased away with a 4 Penfield and a 3 mm Kerrison punch, exposing  the epidural space and the disk space.  First, attention was taken to  pedicle screw placement.  The pilot holes were drilled at the L5 pedicle on  the right, cannulated with the awl, probed and tapped with a 55 tap and a 65  x 45 pedicle screw inserted at L5 on the right, a 65 x 40 in a similar  fashion at S1 on the right and the procedure was repeated at L5 and S1 on  the left.  After all four pedicle screws were placed attention was taken to  the interbody work.  A D'Errico was used to reflect the right S1 nerve root  medially.  __________ was made and a 10 distractor was inserted.  Fluoroscopy confirmed good position of the distractor, good position of the  endplates.  This also reduced some of the listhesis then the left-sided L5  nerve was reflected medially, disk space was cleaned out.  Several large  fragments were removed in  the central compartment using a size 10 cutter and  chisel.  The end plate was scraped and prepared to receive bone graft.  Large posterior osteophytic complexes were scraped off with an 8 cutter  prior to placement with a 10 cutter and the 10 chisel placed and a 10 x 26  mm Tangent allograft was inserted on the left side.  Then after this was  completed and fluoroscopy confirmed good positioning of the bone graft  attention taken back to the right.  The __________ scraped, disk spaces were  adequately cleaned out lateral compartment as well as medial compartment.  Then the size 10 cutter and chisel were used to prepare the endplates.  A  downgoing Epstein curet was used to scrape the central endplates.  The  locally harvested autograft was packed against the left side of the  allograft and a right side allograft was inserted.  Fluoroscopy confirmed  good position of both allografts.  Then the wound was copiously irrigated  with meticulous hemostasis.  Then aggressive decortication was carried out  in TPs and lateral facet complexes.  The locally harvested allograft was  packed in the lateral gutters.  Then a 30 mm rod was sized, selected and  inserted and the top-loading nuts were tightened down at S1.  The L5 pedicle  screw was compressed against S1.  All neural foramina were reexplored with  an angled nerve hook and hockey stick and noted to have no migration of  graft material and to be widely patent.  Then Gelfoam was overlaid atop the  dura and a medium Hemovac drain was placed.  Postop fluoroscopy confirmed  good position of rod, screws and bone grafts.  The muscle and fascia were  then reapproximated with 0 interrupted Vicryl, the subcutaneous tissue  closed with 2-0 interrupted Vicryl and the skin was closed with running 4-0  subcuticular.  Benzoin and Steri-Strips were applied.  The patient was taken  to the recovery room in stable condition.  At the end of the case, needle count  and sponge counts were correct.      GC/MEDQ  D:  01/22/2005  T:  01/22/2005  Job:  [id number]

## 2011-10-26 DIAGNOSIS — Z125 Encounter for screening for malignant neoplasm of prostate: Secondary | ICD-10-CM | POA: Diagnosis not present

## 2011-10-26 DIAGNOSIS — E291 Testicular hypofunction: Secondary | ICD-10-CM | POA: Diagnosis not present

## 2011-10-26 DIAGNOSIS — M545 Low back pain: Secondary | ICD-10-CM | POA: Diagnosis not present

## 2012-02-29 DIAGNOSIS — M5430 Sciatica, unspecified side: Secondary | ICD-10-CM | POA: Diagnosis not present

## 2012-04-01 ENCOUNTER — Other Ambulatory Visit: Payer: Self-pay | Admitting: Family Medicine

## 2012-04-01 DIAGNOSIS — M545 Low back pain, unspecified: Secondary | ICD-10-CM

## 2012-04-08 ENCOUNTER — Inpatient Hospital Stay (HOSPITAL_COMMUNITY): Admission: RE | Admit: 2012-04-08 | Payer: Medicare Other | Source: Ambulatory Visit

## 2012-04-09 ENCOUNTER — Ambulatory Visit (HOSPITAL_COMMUNITY)
Admission: RE | Admit: 2012-04-09 | Discharge: 2012-04-09 | Disposition: A | Discharge status: Home / Self Care | Payer: Medicare Other | Source: Ambulatory Visit | Attending: Family Medicine | Admitting: Family Medicine

## 2012-04-09 DIAGNOSIS — M51379 Other intervertebral disc degeneration, lumbosacral region without mention of lumbar back pain or lower extremity pain: Secondary | ICD-10-CM | POA: Insufficient documentation

## 2012-04-09 DIAGNOSIS — M5137 Other intervertebral disc degeneration, lumbosacral region: Secondary | ICD-10-CM | POA: Insufficient documentation

## 2012-04-09 DIAGNOSIS — M5126 Other intervertebral disc displacement, lumbar region: Secondary | ICD-10-CM | POA: Diagnosis not present

## 2012-04-09 DIAGNOSIS — M545 Low back pain, unspecified: Secondary | ICD-10-CM | POA: Diagnosis not present

## 2012-04-09 MED ORDER — GADOBENATE DIMEGLUMINE 529 MG/ML IV SOLN
17.0000 mL | Freq: Once | INTRAVENOUS | Status: AC | PRN
  Administered 2012-04-09: 17 mL via INTRAVENOUS

## 2012-04-29 DIAGNOSIS — M545 Low back pain: Secondary | ICD-10-CM | POA: Diagnosis not present

## 2012-05-28 DIAGNOSIS — M545 Low back pain: Secondary | ICD-10-CM | POA: Diagnosis not present

## 2012-08-19 DIAGNOSIS — G8929 Other chronic pain: Secondary | ICD-10-CM | POA: Diagnosis not present

## 2012-08-19 DIAGNOSIS — Z23 Encounter for immunization: Secondary | ICD-10-CM | POA: Diagnosis not present

## 2012-10-09 DIAGNOSIS — M545 Low back pain: Secondary | ICD-10-CM | POA: Diagnosis not present

## 2012-10-28 ENCOUNTER — Other Ambulatory Visit (HOSPITAL_COMMUNITY): Payer: Self-pay | Admitting: Family Medicine

## 2012-10-28 DIAGNOSIS — M549 Dorsalgia, unspecified: Secondary | ICD-10-CM

## 2012-11-03 ENCOUNTER — Ambulatory Visit (HOSPITAL_COMMUNITY): Admission: RE | Admit: 2012-11-03 | Payer: Medicare Other | Source: Ambulatory Visit

## 2012-11-10 ENCOUNTER — Ambulatory Visit (HOSPITAL_COMMUNITY)
Admission: RE | Admit: 2012-11-10 | Discharge: 2012-11-10 | Disposition: A | Discharge status: Home / Self Care | Payer: Medicare Other | Source: Ambulatory Visit | Attending: Family Medicine | Admitting: Family Medicine

## 2012-11-10 DIAGNOSIS — M48061 Spinal stenosis, lumbar region without neurogenic claudication: Secondary | ICD-10-CM | POA: Diagnosis not present

## 2012-11-10 DIAGNOSIS — M5126 Other intervertebral disc displacement, lumbar region: Secondary | ICD-10-CM | POA: Diagnosis not present

## 2012-11-10 DIAGNOSIS — M545 Low back pain, unspecified: Secondary | ICD-10-CM | POA: Diagnosis not present

## 2012-11-10 DIAGNOSIS — M549 Dorsalgia, unspecified: Secondary | ICD-10-CM

## 2012-11-10 DIAGNOSIS — M5137 Other intervertebral disc degeneration, lumbosacral region: Secondary | ICD-10-CM | POA: Diagnosis not present

## 2012-11-10 MED ORDER — GADOBENATE DIMEGLUMINE 529 MG/ML IV SOLN
17.0000 mL | Freq: Once | INTRAVENOUS | Status: AC | PRN
  Administered 2012-11-10: 17 mL via INTRAVENOUS

## 2012-11-11 ENCOUNTER — Ambulatory Visit (HOSPITAL_COMMUNITY): Payer: Medicare Other

## 2012-12-11 ENCOUNTER — Encounter: Payer: Self-pay | Admitting: *Deleted

## 2012-12-17 ENCOUNTER — Ambulatory Visit (INDEPENDENT_AMBULATORY_CARE_PROVIDER_SITE_OTHER): Payer: Medicare Other | Admitting: Family Medicine

## 2012-12-17 ENCOUNTER — Encounter: Payer: Self-pay | Admitting: Family Medicine

## 2012-12-17 VITALS — BP 110/80 | HR 70 | Ht 73.0 in | Wt 182.4 lb

## 2012-12-17 DIAGNOSIS — M545 Other chronic pain: Secondary | ICD-10-CM | POA: Insufficient documentation

## 2012-12-17 DIAGNOSIS — G8929 Other chronic pain: Secondary | ICD-10-CM | POA: Diagnosis not present

## 2012-12-17 MED ORDER — OXYCODONE-ACETAMINOPHEN 10-325 MG PO TABS: 1.0000 | ORAL_TABLET | ORAL | Status: DC | PRN

## 2012-12-17 NOTE — Patient Instructions (Signed)
Call us in 4 weeks for next script follow up in three mongths

## 2012-12-17 NOTE — Progress Notes (Signed)
  Subjective:    Patient ID: [name], male    DOB: April 23, 1971, 42 y.o.   MRN: [id number]  HPIpatient comes in today he states he went on a recent trip he left his Percocet bottle at the hotel called back in they did not find that he needs early refill. He denies abusing the medicine he states it does help him function he denies any side effects with it does not cause drowsiness or constipation  He does have chronic back pain I reviewed over the results of his MRI as well as the possibility of him seeing a specialist if he would like to.   Review of Systemssee above     Objective:   Physical Exam Vital signs noted Lungs are clear heart is regular pulse normal low back subjective tenderness patient alert oriented       Assessment & Plan:  Chronic lumbar pain-Percocet 10 mg/325 mg #181 every 4 hours as needed this should last him 30 days. He can call back for a refill on medication. He will followup in 3 months time. I also informed the patient that we will allow this medicine to be refilled currently but in the future if he loses his medicine it is not something that we can cover.

## 2012-12-19 ENCOUNTER — Telehealth: Payer: Self-pay | Admitting: Family Medicine

## 2012-12-19 NOTE — Telephone Encounter (Signed)
Advised patient to come in for office visit. He stated that he will continue to take advil for the fever and his wife is an Charity fundraiser and he will just see if the symptoms will clear up in a couple days. Patient stated that if he gets worse over the weekend he will go to the ER or he will call our office Monday morning for an appointment.

## 2012-12-19 NOTE — Telephone Encounter (Signed)
Patient got bit by a tick yesterday and pulled it off, but he thinks he just pulled the head off. He has been running a low fever, but no other symptoms.  He wants an antibiotic called in to [facility].

## 2013-01-09 ENCOUNTER — Encounter: Payer: Self-pay | Admitting: Family Medicine

## 2013-01-09 ENCOUNTER — Ambulatory Visit (INDEPENDENT_AMBULATORY_CARE_PROVIDER_SITE_OTHER): Payer: Medicare Other | Admitting: Family Medicine

## 2013-01-09 VITALS — BP 120/78 | Wt 180.8 lb

## 2013-01-09 DIAGNOSIS — M545 Low back pain: Secondary | ICD-10-CM | POA: Diagnosis not present

## 2013-01-09 DIAGNOSIS — G8929 Other chronic pain: Secondary | ICD-10-CM

## 2013-01-09 DIAGNOSIS — J309 Allergic rhinitis, unspecified: Secondary | ICD-10-CM

## 2013-01-09 MED ORDER — OXYCODONE-ACETAMINOPHEN 10-325 MG PO TABS: 1.0000 | ORAL_TABLET | ORAL | Status: DC | PRN

## 2013-01-09 MED ORDER — AMITRIPTYLINE HCL 50 MG PO TABS: ORAL_TABLET | ORAL | Status: DC

## 2013-01-09 MED ORDER — FLUTICASONE PROPIONATE 50 MCG/ACT NA SUSP: 2.0000 | Freq: Every day | NASAL | Status: DC

## 2013-01-09 NOTE — Patient Instructions (Addendum)
Allegra 180 mg (store brand) one daily Add flonase 2 sprays each nare daily amytrip 50 mg 1 to 2 at bedtime

## 2013-01-09 NOTE — Progress Notes (Signed)
  Subjective:    Patient ID: [name], male    DOB: 1971-05-18, 42 y.o.   MRN: [id number]  HPIpatient with a lot of head congestion sinus pressure drainage coughing sneezing symptoms over the past week and had been trying Claritin not helping enough some watery eyes but not severe. Denies fevers sweats chills denies wheezing  Has chronic back pain oxycodone using 5-6 per day not abusing and will need new prescription also requests prescription for Elavil he uses that at night time to help with pain and discomfort he helps his neuropathic pain.    Review of Systems Please see above    Objective:   Physical Exam  Vital signs noted, lungs clear heart regular in years normal throat normal neck no masses      Assessment & Plan:  Allergic rhinitis-Allegra 180 mg generic one daily, Flonase 2 sprays each nostril, if ongoing troubles or worse followup consider Depo-Medrol shot if persists  Chronic pain-restart Elavil 50 mg 1-2 each bedtime when necessary, new prescription for pain medication that he may fill on May 1. Percocet 10 mg/325 #180. One every 4 hours when necessary.  Followup within 3 months time.

## 2013-02-05 ENCOUNTER — Telehealth: Payer: Self-pay | Admitting: Family Medicine

## 2013-02-05 DIAGNOSIS — M545 Low back pain: Secondary | ICD-10-CM

## 2013-02-05 MED ORDER — OXYCODONE-ACETAMINOPHEN 10-325 MG PO TABS: 1.0000 | ORAL_TABLET | ORAL | Status: DC | PRN

## 2013-02-05 NOTE — Telephone Encounter (Signed)
Patient needs a refill of Percocet 10-325

## 2013-02-05 NOTE — Telephone Encounter (Signed)
Last filled on Jan 15, 2013 per [facility].

## 2013-02-05 NOTE — Telephone Encounter (Signed)
Last wrote on 01/09/13 for percocet 10/325 #180

## 2013-02-05 NOTE — Telephone Encounter (Signed)
Write or print script, may be filled May 29

## 2013-02-05 NOTE — Telephone Encounter (Signed)
Please contact his pharm find out when last rx was filled

## 2013-02-06 NOTE — Telephone Encounter (Signed)
Patient notified script is ready for pick up at front desk.

## 2013-03-05 ENCOUNTER — Telehealth: Payer: Self-pay | Admitting: Family Medicine

## 2013-03-05 NOTE — Telephone Encounter (Signed)
May have 1 refill, to f/u OV in July before further

## 2013-03-05 NOTE — Telephone Encounter (Signed)
Patient needs a refill of his Percocet

## 2013-03-05 NOTE — Telephone Encounter (Signed)
Left message with gentleman to notify patient RX is ready for pick up upfront.

## 2013-04-06 ENCOUNTER — Telehealth: Payer: Self-pay | Admitting: Family Medicine

## 2013-04-06 NOTE — Telephone Encounter (Signed)
Give 30 day, keep appt

## 2013-04-06 NOTE — Telephone Encounter (Signed)
Pt needs refill on his Percocet 10/325 has appt scheduled for 7/28, couldn't not get in this week due to full schedule. [facility]

## 2013-04-06 NOTE — Telephone Encounter (Signed)
Left message on voicemail notifying patient RX ready for pickup. 

## 2013-04-06 NOTE — Telephone Encounter (Signed)
Last refill was 03/05/13.

## 2013-04-13 ENCOUNTER — Ambulatory Visit: Payer: Medicare Other | Admitting: Family Medicine

## 2013-04-16 ENCOUNTER — Ambulatory Visit (INDEPENDENT_AMBULATORY_CARE_PROVIDER_SITE_OTHER): Payer: Medicare Other | Admitting: Family Medicine

## 2013-04-16 ENCOUNTER — Encounter: Payer: Self-pay | Admitting: Family Medicine

## 2013-04-16 VITALS — BP 112/82 | HR 70 | Wt 189.6 lb

## 2013-04-16 DIAGNOSIS — M545 Low back pain: Secondary | ICD-10-CM | POA: Diagnosis not present

## 2013-04-16 DIAGNOSIS — E785 Hyperlipidemia, unspecified: Secondary | ICD-10-CM | POA: Diagnosis not present

## 2013-04-16 DIAGNOSIS — Z0000 Encounter for general adult medical examination without abnormal findings: Secondary | ICD-10-CM

## 2013-04-16 DIAGNOSIS — G8929 Other chronic pain: Secondary | ICD-10-CM | POA: Diagnosis not present

## 2013-04-16 MED ORDER — OXYCODONE-ACETAMINOPHEN 10-325 MG PO TABS: 1.0000 | ORAL_TABLET | ORAL | Status: DC | PRN

## 2013-04-16 MED ORDER — BUPROPION HCL ER (SR) 150 MG PO TB12: 150.0000 mg | ORAL_TABLET | Freq: Two times a day (BID) | ORAL | Status: DC

## 2013-04-16 NOTE — Patient Instructions (Signed)
As part of your visit today we have covered chronic pain. You have been given prescription for pain medicines. Certain pain medications such as hydrocodone  allow for refills. Other pain medications such as oxycodone require separate prescriptions. Nonetheless, your expected to come in for a office visit before further pain medications are issued.   We will not refill medications or early nor will we give an extended month supply at the end of these prescriptions.It is your responsibility to keep up with medications. They will not be replaced.  It is your responsibility to schedule an office visit in 3-4 months to be seen before you are out of your medication. Do not call our office to request early refills or additional refills.   We are required by law to adhere to regulations. Failure on our part to follow these regulations could jeopardize our prescription license which in turn would cause us not to be able to care for you.  

## 2013-04-16 NOTE — Progress Notes (Signed)
  Subjective:    Patient ID: [name], male    DOB: 01/29/1971, 42 y.o.   MRN: [id number]  HPI Patient is here for an follow up visit but has concerns about depression  Patient times finds himself feeling blue but he also states that he does fair amount of work around the house but has no other hobbies does not get out of the house much. It is disabled. Patient denies being suicidal. PMH chronic back pain he states he takes medication anywhere from 3-6 times per day does not abuse medication. He states it does help him function better. Patient is a smoker counseled extensively today states he is willing to quit Review of Systems Denies wheezing chest tightness pressure pain    Objective:   Physical Exam Lungs are clear no crackles heart regular subjective low back tenderness extremities no edema       Assessment & Plan:  Chronic back pain-prescriptions 3 separate were given. Percocet 10 mg/325. #180 prescription. Followup again in 3 months  Smoking cessation with some depression symptoms start Wellbutrin 6 SR 150 mg twice a day followup again in 3 months time sooner if not improved over the next few weeks patient not suicidal

## 2013-07-13 ENCOUNTER — Telehealth: Payer: Self-pay | Admitting: Family Medicine

## 2013-07-13 NOTE — Telephone Encounter (Signed)
Last office visit 04/16/13

## 2013-07-13 NOTE — Telephone Encounter (Signed)
oxyCODONE-acetaminophen (PERCOCET) 10-325 MG per tablet  Refill please

## 2013-07-13 NOTE — Telephone Encounter (Signed)
Left message to return call 

## 2013-07-13 NOTE — Telephone Encounter (Signed)
Explain to pt now a  schedule 2 drug. Needs OV. Please schedule within 7 days

## 2013-07-13 NOTE — Telephone Encounter (Signed)
He already scheduled this appt for Mon Nov 3rd

## 2013-07-20 ENCOUNTER — Ambulatory Visit (INDEPENDENT_AMBULATORY_CARE_PROVIDER_SITE_OTHER): Payer: Medicare Other | Admitting: Family Medicine

## 2013-07-20 ENCOUNTER — Encounter: Payer: Self-pay | Admitting: Family Medicine

## 2013-07-20 VITALS — BP 128/86 | Ht 73.0 in | Wt 179.4 lb

## 2013-07-20 DIAGNOSIS — M545 Low back pain: Secondary | ICD-10-CM

## 2013-07-20 DIAGNOSIS — G8929 Other chronic pain: Secondary | ICD-10-CM

## 2013-07-20 MED ORDER — OXYCODONE-ACETAMINOPHEN 10-325 MG PO TABS: 1.0000 | ORAL_TABLET | ORAL | Status: DC | PRN

## 2013-07-20 NOTE — Progress Notes (Signed)
  Subjective:    Patient ID: [name], male    DOB: 1970-09-24, 42 y.o.   MRN: [id number]  HPI Patient arrives for a follow up on chronic pain management. This patient was seen today for chronic pain  The medication list was reviewed and updated.  Discussion was held with the patient regarding compliance with pain medication. The patient was advised the importance of maintaining medication and not using illegal substances with these. The patient was educated that we can provide 3 monthly scripts for their medication, it is their responsibility to follow the instructions. Discussion was held with the patient to make sure they're not having significant side effects. Patient is aware that pain medications are meant to minimize the severity of the pain to allow their pain levels to improve to allow for better function. They are aware of that pain medications cannot totally remove their pain.   Patient also relates sometimes a feeling anxious wakes up 5 in the morning a little bit stressed out about what's going on home denies being depressed. No panic attacks. He wonders what he should do for that.  He tried Wellbutrin to see if that would help him quit smoking it did not. He is no longer taking it. PMH benign see above Review of Systems See above, no chest pain shortness of breath    Objective:   Physical Exam  Lungs are clear hearts regular pulse normal blood pressure good subjective lower back discomfort      Assessment & Plan:  Lower back pain-medications as prescribed. 3 prescriptions given. Followup in 3 months. Anxiety issues no anxiety medicine prescribe today he will work with a stress issues plus I recommend trying serotonin reuptake inhibitor patient defers currently

## 2013-10-16 ENCOUNTER — Ambulatory Visit (INDEPENDENT_AMBULATORY_CARE_PROVIDER_SITE_OTHER): Payer: Medicare Other | Admitting: Family Medicine

## 2013-10-16 ENCOUNTER — Encounter: Payer: Self-pay | Admitting: Family Medicine

## 2013-10-16 VITALS — BP 124/86 | Ht 73.0 in | Wt 185.8 lb

## 2013-10-16 DIAGNOSIS — G8929 Other chronic pain: Secondary | ICD-10-CM

## 2013-10-16 DIAGNOSIS — M545 Low back pain, unspecified: Secondary | ICD-10-CM | POA: Diagnosis not present

## 2013-10-16 DIAGNOSIS — E785 Hyperlipidemia, unspecified: Secondary | ICD-10-CM

## 2013-10-16 MED ORDER — CYCLOBENZAPRINE HCL 10 MG PO TABS: 10.0000 mg | ORAL_TABLET | Freq: Three times a day (TID) | ORAL | Status: DC | PRN

## 2013-10-16 MED ORDER — OXYCODONE-ACETAMINOPHEN 10-325 MG PO TABS: 1.0000 | ORAL_TABLET | ORAL | Status: DC | PRN

## 2013-10-16 MED ORDER — NAPROXEN 500 MG PO TABS: 500.0000 mg | ORAL_TABLET | Freq: Two times a day (BID) | ORAL | Status: DC

## 2013-10-16 MED ORDER — OXYCODONE-ACETAMINOPHEN 10-325 MG PO TABS
1.0000 | ORAL_TABLET | ORAL | Status: DC | PRN
Start: 2013-10-16 — End: 2013-10-16

## 2013-10-16 NOTE — Progress Notes (Signed)
   Subjective:    Patient ID: [name], male    DOB: 12/21/1970, 43 y.o.   MRN: [id number]  Back Pain This is a chronic (flare up currently) problem. The current episode started more than 1 year ago. The problem has been gradually worsening since onset. The pain is present in the thoracic spine and lumbar spine. The quality of the pain is described as aching. Radiates to: into the r hand. The pain is at a severity of 8/10. The pain is moderate. The pain is the same all the time. The symptoms are aggravated by bending, lying down and twisting. Stiffness is present all day. Associated symptoms include numbness (r hand) and paresthesias (r hand). Pertinent negatives include no abdominal pain, bladder incontinence, bowel incontinence or chest pain. He has tried analgesics, heat and home exercises for the symptoms. The treatment provided mild relief.   Patient arrives for a follow up on chronic pain management. Patient also reports stiffness in back and pain-cant sleep for few weeks. Moderate mid back into the waist area.  PMH chronic back pain previous surgeries Review of Systems  Constitutional: Negative for activity change, appetite change and fatigue.  HENT: Negative for congestion.   Respiratory: Negative for cough.   Cardiovascular: Negative for chest pain.  Gastrointestinal: Negative for abdominal pain and bowel incontinence.  Endocrine: Positive for cold intolerance. Negative for heat intolerance.  Genitourinary: Negative for bladder incontinence.  Musculoskeletal: Positive for back pain.  Neurological: Positive for numbness (r hand) and paresthesias (r hand).       Objective:   Physical Exam  Vitals reviewed. Constitutional: He appears well-nourished.  Cardiovascular: Normal rate, regular rhythm and normal heart sounds.   No murmur heard. Pulmonary/Chest: Effort normal and breath sounds normal.  Musculoskeletal: He exhibits no edema.  Severe upper back and lower back pain    Lymphadenopathy:    He has no cervical adenopathy.  Neurological: He is alert.  Psychiatric: His behavior is normal.   There is no weakness in the arms or hands.      Assessment & Plan:  #1 chronic back pain-currently uses oxycodone 6 tablets a day. He wondered if he could go higher than that. I told him to limit for our practice to 6 per day. 3 prescriptions were given. He will followup in 3 months. If he decides to see back specialist we can help set this up. We did talk about pain stimulators and held the results on these are iffy, we also talked about other modalities such as chiropractic but for now he will stick with the medication. He will use Flexeril at nighttime as needed to help with muscle spasms and help him rest. I do not recommend benzodiazepines to help him rest.  25 minutes spent with patient.

## 2013-10-21 ENCOUNTER — Telehealth: Payer: Self-pay | Admitting: Family Medicine

## 2013-10-21 MED ORDER — OXYCODONE HCL 10 MG PO TABS: 10.0000 mg | ORAL_TABLET | ORAL | Status: DC | PRN

## 2013-10-21 NOTE — Telephone Encounter (Signed)
He may have 3 scripts for Oxycodone 10 mg, 1 q4 prn pain , #180. Date the scripts the same as the others thanks

## 2013-10-21 NOTE — Telephone Encounter (Signed)
Pt returned his script for oxycodone-acetaminophen 10/325  Pharmacist suggested he get just the oxycodone 10/325   Can we change this out for him, the cost for him is significantly different  He will bring back the others if you will just go ahead and change all  Three scripts?   [place] [facility] if you have any questions call the PharmD there

## 2013-10-21 NOTE — Telephone Encounter (Signed)
We can do all three, plz clarify that pt wants plain oxycodone 10 mg not oxycodone/325 acetamenophen

## 2013-10-21 NOTE — Telephone Encounter (Signed)
Patient notified

## 2014-01-12 ENCOUNTER — Ambulatory Visit (INDEPENDENT_AMBULATORY_CARE_PROVIDER_SITE_OTHER): Payer: Medicare Other | Admitting: Family Medicine

## 2014-01-12 ENCOUNTER — Encounter: Payer: Self-pay | Admitting: Family Medicine

## 2014-01-12 VITALS — BP 122/74 | Ht 74.0 in | Wt 180.0 lb

## 2014-01-12 DIAGNOSIS — M79675 Pain in left toe(s): Secondary | ICD-10-CM

## 2014-01-12 DIAGNOSIS — M79609 Pain in unspecified limb: Secondary | ICD-10-CM

## 2014-01-12 DIAGNOSIS — M545 Low back pain, unspecified: Secondary | ICD-10-CM

## 2014-01-12 DIAGNOSIS — F17200 Nicotine dependence, unspecified, uncomplicated: Secondary | ICD-10-CM | POA: Diagnosis not present

## 2014-01-12 DIAGNOSIS — E785 Hyperlipidemia, unspecified: Secondary | ICD-10-CM | POA: Diagnosis not present

## 2014-01-12 DIAGNOSIS — G8929 Other chronic pain: Secondary | ICD-10-CM | POA: Diagnosis not present

## 2014-01-12 DIAGNOSIS — Z79899 Other long term (current) drug therapy: Secondary | ICD-10-CM

## 2014-01-12 DIAGNOSIS — Z720 Tobacco use: Secondary | ICD-10-CM

## 2014-01-12 MED ORDER — OXYCODONE HCL 10 MG PO TABS: 10.0000 mg | ORAL_TABLET | ORAL | Status: DC | PRN

## 2014-01-12 NOTE — Progress Notes (Signed)
   Subjective:    Patient ID: [name], male    DOB: 06/10/1971, 43 y.o.   MRN: [id number]  HPIMed check up.  This patient was seen today for chronic pain  The medication list was reviewed and updated.  Discussion was held with the patient regarding compliance with pain medication. The patient was advised the importance of maintaining medication and not using illegal substances with these. The patient was educated that we can provide 3 monthly scripts for their medication, it is their responsibility to follow the instructions. Discussion was held with the patient to make sure they're not having significant side effects. Patient is aware that pain medications are meant to minimize the severity of the pain to allow their pain levels to improve to allow for better function. They are aware of that pain medications cannot totally remove their pain.     Pain in left little toe. Off and on for a few months. Getting worse. He fractured the ankle in eighth grade causes inversion of his foot pressure on the lateral aspect of the  This patient has had some problems with mild elevation of cholesterol therefore he will go ahead and get a cholesterol profile.  He states his allergies overall are doing well on medication  Review of Systems  Constitutional: Negative for activity change, appetite change and fatigue.  Gastrointestinal: Negative for abdominal pain.  Neurological: Negative for headaches.  Psychiatric/Behavioral: Negative for behavioral problems.       Objective:   Physical Exam  Vitals reviewed. Constitutional: He appears well-nourished.  Cardiovascular: Normal rate, regular rhythm and normal heart sounds.   No murmur heard. Pulmonary/Chest: Effort normal and breath sounds normal.  Musculoskeletal: He exhibits no edema.  Lymphadenopathy:    He has no cervical adenopathy.  Neurological: He is alert.  Psychiatric: His behavior is normal.    On his foot exam especially the  left foot there is some inversion related to a previous ankle fracture when he is in eighth grade because of this is putting pressure on the lateral aspect of his toe when he walks causing a callus on the outside portion of the toe where there is really no fat pad for him to push off on that's what I think is causing the pain      Assessment & Plan:  1. Chronic low back pain  back pain is stable he uses medicine responsibly 3 prescriptions written. Followup again in 3 months.  2. Tobacco abuse Patient counseled strongly to quit smoking  3. Toe pain, left We will refer to podiatry. Apparently because of his previous ankle fracture he walks on the side of his foot which puts pressure on the small toe causing pain may end up needing to have surgery on this this will be up to podiatry  4. Hyperlipidemia He needs a lipid panel done he needs to watch diet may have to be on medication patient is also due for other medicines - Lipid panel  5. Encounter for long-term (current) use of other medications Patient is also due for other lab work - Basic metabolic panel - Hepatic function panel

## 2014-03-02 DIAGNOSIS — M79609 Pain in unspecified limb: Secondary | ICD-10-CM | POA: Diagnosis not present

## 2014-03-02 DIAGNOSIS — M2040 Other hammer toe(s) (acquired), unspecified foot: Secondary | ICD-10-CM | POA: Diagnosis not present

## 2014-03-30 DIAGNOSIS — M2040 Other hammer toe(s) (acquired), unspecified foot: Secondary | ICD-10-CM | POA: Diagnosis not present

## 2014-03-30 DIAGNOSIS — M79609 Pain in unspecified limb: Secondary | ICD-10-CM | POA: Diagnosis not present

## 2014-04-01 ENCOUNTER — Other Ambulatory Visit: Payer: Self-pay | Admitting: Podiatry

## 2014-04-07 ENCOUNTER — Other Ambulatory Visit: Payer: Self-pay | Admitting: Podiatry

## 2014-04-09 ENCOUNTER — Encounter: Payer: Self-pay | Admitting: Family Medicine

## 2014-04-09 ENCOUNTER — Ambulatory Visit (INDEPENDENT_AMBULATORY_CARE_PROVIDER_SITE_OTHER): Payer: Medicare Other | Admitting: Family Medicine

## 2014-04-09 VITALS — BP 120/70 | Ht 75.0 in | Wt 177.4 lb

## 2014-04-09 DIAGNOSIS — M545 Low back pain, unspecified: Secondary | ICD-10-CM

## 2014-04-09 DIAGNOSIS — G8929 Other chronic pain: Secondary | ICD-10-CM | POA: Diagnosis not present

## 2014-04-09 MED ORDER — OXYCODONE HCL 10 MG PO TABS: 10.0000 mg | ORAL_TABLET | ORAL | Status: DC | PRN

## 2014-04-09 MED ORDER — VARENICLINE TARTRATE 0.5 MG X 11 & 1 MG X 42 PO MISC: ORAL | Status: DC

## 2014-04-09 MED ORDER — VARENICLINE TARTRATE 1 MG PO TABS: 1.0000 mg | ORAL_TABLET | Freq: Two times a day (BID) | ORAL | Status: DC

## 2014-04-09 NOTE — Progress Notes (Signed)
   Subjective:    Patient ID: [name], male    DOB: 1971-04-09, 43 y.o.   MRN: [id number]  HPI  This patient was seen today for chronic pain  The medication list was reviewed and updated.   -Compliance with pain medication: yes- Oxycodone 10 mg #180  The patient was advised the importance of maintaining medication and not using illegal substances with these.  Refills needed: yes  The patient was educated that we can provide 3 monthly scripts for their medication, it is their responsibility to follow the instructions.  Side effects or complications from medications: no  Patient is aware that pain medications are meant to minimize the severity of the pain to allow their pain levels to improve to allow for better function. They are aware of that pain medications cannot totally remove their pain.  Due for UDT ( at least once per year) : Later this fall  Long discussion held regarding smoking in the harmful effects risk of cancer stroke heart attacks  Patient seeing podiatrist for little toe problem on left foot- Podiatrist going to remove toe next month.     Review of Systems  Constitutional: Negative for activity change, appetite change and fatigue.  HENT: Negative for congestion.   Respiratory: Negative for cough.   Cardiovascular: Negative for chest pain.  Gastrointestinal: Negative for abdominal pain.  Endocrine: Negative for polydipsia and polyphagia.  Neurological: Negative for weakness.  Psychiatric/Behavioral: Negative for confusion.       Objective:   Physical Exam  Vitals reviewed. Constitutional: He appears well-nourished.  Cardiovascular: Normal rate, regular rhythm and normal heart sounds.   No murmur heard. Pulmonary/Chest: Effort normal and breath sounds normal.  Musculoskeletal: He exhibits no edema.  Lymphadenopathy:    He has no cervical adenopathy.  Neurological: He is alert.  Psychiatric: His behavior is normal.          Assessment &  Plan:  Encouraged to quit smoking-long discussion held he is open to using Chantix prescriptions given  Rx given-chronic pain-3 prescriptions given followup 3 months  Toe surgery- Dr Charmian MuffIvan McKinney-he relates Dr. Nolen MuMcKinney will be removing his small toe because of chronic pain and deformity

## 2014-04-13 ENCOUNTER — Ambulatory Visit: Payer: Medicare Other | Admitting: Family Medicine

## 2014-04-15 ENCOUNTER — Encounter (HOSPITAL_COMMUNITY): Payer: Self-pay | Admitting: Pharmacy Technician

## 2014-04-15 ENCOUNTER — Other Ambulatory Visit: Payer: Self-pay | Admitting: Podiatry

## 2014-04-19 NOTE — Patient Instructions (Addendum)
[name]  04/19/2014   Your procedure is scheduled on:  Wednesday, 04/28/14  Report to [facility] at 0700 AM.  Call this number if you have problems the morning of surgery: [phone]   Remember:   Do not eat food or drink liquids after midnight.   Take these medicines the morning of surgery with A SIP OF WATER: oxycodone, flonase, flexeril   Do not wear jewelry, make-up or nail polish.  Do not wear lotions, powders, or perfumes. You may wear deodorant.  Do not shave 48 hours prior to surgery. Men may shave face and neck.  Do not bring valuables to the hospital.  [facility] is not responsible                  for any belongings or valuables.               Contacts, dentures or bridgework may not be worn into surgery.  Leave suitcase in the car. After surgery it may be brought to your room.  For patients admitted to the hospital, discharge time is determined by your                treatment team.               Patients discharged the day of surgery will not be allowed to drive  home.  Name and phone number of your driver: family  Special Instructions: Shower using CHG 2 nights before surgery and the night before surgery.  If you shower the day of surgery use CHG.  Use special wash - you have one bottle of CHG for all showers.  You should use approximately 1/3 of the bottle for each shower.   Please read over the following fact sheets that you were given: Anesthesia Post-op Instructions and Care and Recovery After Surgery PATIENT INSTRUCTIONS POST-ANESTHESIA  IMMEDIATELY FOLLOWING SURGERY:  Do not drive or operate machinery for the first twenty four hours after surgery.  Do not make any important decisions for twenty four hours after surgery or while taking narcotic pain medications or sedatives.  If you develop intractable nausea and vomiting or a severe headache please notify your doctor immediately.  FOLLOW-UP:  Please make an appointment with your surgeon as instructed. You do  not need to follow up with anesthesia unless specifically instructed to do so.  WOUND CARE INSTRUCTIONS (if applicable):  Keep a dry clean dressing on the anesthesia/puncture wound site if there is drainage.  Once the wound has quit draining you may leave it open to air.  Generally you should leave the bandage intact for twenty four hours unless there is drainage.  If the epidural site drains for more than 36-48 hours please call the anesthesia department.  QUESTIONS?:  Please feel free to call your physician or the hospital operator if you have any questions, and they will be happy to assist you.       Hammer Toes Hammer toes is a condition in which one or more of your toes is permanently flexed. CAUSES  This happens when a muscle imbalance or abnormal bone length makes your small toes buckle. This causes the toe joint to contract and the strong cord-like bands that attach muscles to the bones (tendons) in your toes to shorten.  SIGNS AND SYMPTOMS  Common symptoms of flexible hammer toes include:   A buildup of skin cells (corns). Corns occur where boney bumps come in frequent contact with hard surfaces. For example,  where your shoes press and rub.  Irritation.  Inflammation.  Pain.  Limited motion in your toes. DIAGNOSIS  Hammer toes are diagnosed through a physical exam of your toes. During the exam, your health care provider may try to reproduce your symptoms by manipulating your foot. Often, X-ray exams are done to determine the degree of deformity and to make sure that the cause is not a fracture.  TREATMENT  Hammer toes can be treated with corrective surgery. There are several types of surgical procedures that can treat hammer toes. The most common procedures include:  Arthroplasty--A portion of the joint is surgically removed and your toe is straightened. The gap fills in with fibrous tissue. This procedure helps treat pain and deformity and helps restore  function.  Fusion--Cartilage between the two bones of the affected joint is taken out and the bones fuse together into one longer bone. This helps keep your toe stable and reduces pain but leaves your toe stiff, yet straight.  Implantation--A portion of your bone is removed and replaced with an implant to restore motion.  Flexor tendon transfers--This procedure repositions the tendons that curl the toes down (flexor tendons). This may be done to release the deforming force that causes your toe to buckle. Several of these procedures require fixing your toe with a pin that is visible at the tip of your toe. The pin keeps the toe straight during healing. Your health care provider will remove the pin usually within 4-8 weeks after the procedure.  Document Released: 08/31/2000 Document Revised: 09/08/2013 Document Reviewed: 05/11/2013 [facility] Patient Information 2015 [employer] [place]. This information is not intended to replace advice given to you by your health care provider. Make sure you discuss any questions you have with your health care provider.

## 2014-04-20 ENCOUNTER — Encounter (HOSPITAL_COMMUNITY)
Admission: RE | Admit: 2014-04-20 | Discharge: 2014-04-20 | Disposition: A | Discharge status: Home / Self Care | Payer: Medicare Other | Source: Ambulatory Visit | Attending: Podiatry | Admitting: Podiatry

## 2014-04-20 ENCOUNTER — Ambulatory Visit (HOSPITAL_COMMUNITY)
Admission: RE | Admit: 2014-04-20 | Discharge: 2014-04-20 | Disposition: A | Discharge status: Home / Self Care | Payer: Medicare Other | Source: Ambulatory Visit | Attending: Podiatry | Admitting: Podiatry

## 2014-04-20 ENCOUNTER — Encounter (HOSPITAL_COMMUNITY): Payer: Self-pay

## 2014-04-20 DIAGNOSIS — Z01812 Encounter for preprocedural laboratory examination: Secondary | ICD-10-CM | POA: Diagnosis not present

## 2014-04-20 DIAGNOSIS — Z01818 Encounter for other preprocedural examination: Secondary | ICD-10-CM | POA: Diagnosis not present

## 2014-04-20 DIAGNOSIS — M2040 Other hammer toe(s) (acquired), unspecified foot: Secondary | ICD-10-CM | POA: Insufficient documentation

## 2014-04-20 DIAGNOSIS — M2140 Flat foot [pes planus] (acquired), unspecified foot: Secondary | ICD-10-CM | POA: Insufficient documentation

## 2014-04-20 DIAGNOSIS — M79609 Pain in unspecified limb: Secondary | ICD-10-CM | POA: Diagnosis not present

## 2014-04-20 HISTORY — DX: Unspecified osteoarthritis, unspecified site: M19.90

## 2014-04-20 LAB — BASIC METABOLIC PANEL
ANION GAP: 13 (ref 5–15)
BUN: 12 mg/dL (ref 6–23)
CALCIUM: 9.7 mg/dL (ref 8.4–10.5)
CO2: 27 meq/L (ref 19–32)
CREATININE: 0.84 mg/dL (ref 0.50–1.35)
Chloride: 101 mEq/L (ref 96–112)
Glucose, Bld: 104 mg/dL — ABNORMAL HIGH (ref 70–99)
Potassium: 4.1 mEq/L (ref 3.7–5.3)
Sodium: 141 mEq/L (ref 137–147)

## 2014-04-20 LAB — HEMOGLOBIN AND HEMATOCRIT, BLOOD
HEMATOCRIT: 44 % (ref 39.0–52.0)
HEMOGLOBIN: 15 g/dL (ref 13.0–17.0)

## 2014-04-28 ENCOUNTER — Encounter (HOSPITAL_COMMUNITY)
Admission: RE | Disposition: A | Discharge status: Home / Self Care | Payer: Self-pay | Source: Ambulatory Visit | Attending: Podiatry

## 2014-04-28 ENCOUNTER — Encounter (HOSPITAL_COMMUNITY): Payer: Self-pay

## 2014-04-28 ENCOUNTER — Ambulatory Visit (HOSPITAL_COMMUNITY)
Admission: RE | Admit: 2014-04-28 | Discharge: 2014-04-28 | Disposition: A | Discharge status: Home / Self Care | Payer: Medicare Other | Source: Ambulatory Visit | Attending: Podiatry | Admitting: Podiatry

## 2014-04-28 ENCOUNTER — Ambulatory Visit (HOSPITAL_COMMUNITY): Payer: Medicare Other | Admitting: Anesthesiology

## 2014-04-28 ENCOUNTER — Encounter (HOSPITAL_COMMUNITY): Payer: Medicare Other | Admitting: Anesthesiology

## 2014-04-28 ENCOUNTER — Ambulatory Visit (HOSPITAL_COMMUNITY): Payer: Medicare Other

## 2014-04-28 DIAGNOSIS — M545 Low back pain, unspecified: Secondary | ICD-10-CM | POA: Diagnosis not present

## 2014-04-28 DIAGNOSIS — M948X9 Other specified disorders of cartilage, unspecified sites: Secondary | ICD-10-CM | POA: Diagnosis not present

## 2014-04-28 DIAGNOSIS — M2042 Other hammer toe(s) (acquired), left foot: Secondary | ICD-10-CM

## 2014-04-28 DIAGNOSIS — M7989 Other specified soft tissue disorders: Secondary | ICD-10-CM | POA: Diagnosis not present

## 2014-04-28 DIAGNOSIS — L851 Acquired keratosis [keratoderma] palmaris et plantaris: Secondary | ICD-10-CM | POA: Diagnosis not present

## 2014-04-28 DIAGNOSIS — M2040 Other hammer toe(s) (acquired), unspecified foot: Secondary | ICD-10-CM | POA: Diagnosis not present

## 2014-04-28 DIAGNOSIS — F17200 Nicotine dependence, unspecified, uncomplicated: Secondary | ICD-10-CM | POA: Insufficient documentation

## 2014-04-28 DIAGNOSIS — M79609 Pain in unspecified limb: Secondary | ICD-10-CM | POA: Insufficient documentation

## 2014-04-28 HISTORY — PX: AMPUTATION: SHX166

## 2014-04-28 SURGERY — AMPUTATION DIGIT
Anesthesia: Monitor Anesthesia Care | Site: Foot | Laterality: Left

## 2014-04-28 MED ORDER — FENTANYL CITRATE 0.05 MG/ML IJ SOLN: 25.0000 ug | INTRAMUSCULAR | Status: DC | PRN

## 2014-04-28 MED ORDER — SODIUM CHLORIDE 0.9 % IJ SOLN
INTRAMUSCULAR | Status: AC
  Filled 2014-04-28: qty 10

## 2014-04-28 MED ORDER — FENTANYL CITRATE 0.05 MG/ML IJ SOLN
INTRAMUSCULAR | Status: AC
  Filled 2014-04-28: qty 2

## 2014-04-28 MED ORDER — MUPIROCIN 2 % EX OINT: 1.0000 "application " | TOPICAL_OINTMENT | Freq: Two times a day (BID) | CUTANEOUS | Status: DC

## 2014-04-28 MED ORDER — PROPOFOL 10 MG/ML IV EMUL
INTRAVENOUS | Status: AC
  Filled 2014-04-28: qty 20

## 2014-04-28 MED ORDER — LACTATED RINGERS IV SOLN
INTRAVENOUS | Status: DC
  Administered 2014-04-28: 09:00:00 via INTRAVENOUS

## 2014-04-28 MED ORDER — MIDAZOLAM HCL 2 MG/2ML IJ SOLN
1.0000 mg | INTRAMUSCULAR | Status: DC | PRN
  Administered 2014-04-28: 2 mg via INTRAVENOUS

## 2014-04-28 MED ORDER — MIDAZOLAM HCL 2 MG/2ML IJ SOLN
INTRAMUSCULAR | Status: AC
  Filled 2014-04-28: qty 2

## 2014-04-28 MED ORDER — CEFAZOLIN SODIUM-DEXTROSE 2-3 GM-% IV SOLR
INTRAVENOUS | Status: AC
  Filled 2014-04-28: qty 50

## 2014-04-28 MED ORDER — BUPIVACAINE HCL (PF) 0.5 % IJ SOLN
INTRAMUSCULAR | Status: AC
  Filled 2014-04-28: qty 30

## 2014-04-28 MED ORDER — PROPOFOL INFUSION 10 MG/ML OPTIME
INTRAVENOUS | Status: DC | PRN
  Administered 2014-04-28: 50 ug/kg/min via INTRAVENOUS

## 2014-04-28 MED ORDER — ONDANSETRON HCL 4 MG/2ML IJ SOLN
4.0000 mg | Freq: Once | INTRAMUSCULAR | Status: AC
  Administered 2014-04-28: 4 mg via INTRAVENOUS

## 2014-04-28 MED ORDER — DOXYCYCLINE HYCLATE 100 MG PO CAPS: 100.0000 mg | ORAL_CAPSULE | Freq: Two times a day (BID) | ORAL | Status: DC

## 2014-04-28 MED ORDER — ONDANSETRON HCL 4 MG/2ML IJ SOLN
INTRAMUSCULAR | Status: AC
  Filled 2014-04-28: qty 2

## 2014-04-28 MED ORDER — MIDAZOLAM HCL 5 MG/5ML IJ SOLN
INTRAMUSCULAR | Status: DC | PRN
  Administered 2014-04-28: 2 mg via INTRAVENOUS

## 2014-04-28 MED ORDER — LIDOCAINE HCL (PF) 1 % IJ SOLN
INTRAMUSCULAR | Status: AC
  Filled 2014-04-28: qty 30

## 2014-04-28 MED ORDER — 0.9 % SODIUM CHLORIDE (POUR BTL) OPTIME
TOPICAL | Status: DC | PRN
  Administered 2014-04-28: 1000 mL

## 2014-04-28 MED ORDER — CEFAZOLIN SODIUM-DEXTROSE 2-3 GM-% IV SOLR
2.0000 g | INTRAVENOUS | Status: AC
  Administered 2014-04-28: 2 g via INTRAVENOUS

## 2014-04-28 MED ORDER — LACTATED RINGERS IV SOLN
INTRAVENOUS | Status: DC | PRN
  Administered 2014-04-28: 09:00:00 via INTRAVENOUS

## 2014-04-28 MED ORDER — FENTANYL CITRATE 0.05 MG/ML IJ SOLN
25.0000 ug | INTRAMUSCULAR | Status: AC
  Administered 2014-04-28 (×2): 25 ug via INTRAVENOUS

## 2014-04-28 MED ORDER — FENTANYL CITRATE 0.05 MG/ML IJ SOLN
INTRAMUSCULAR | Status: DC | PRN
  Administered 2014-04-28 (×2): 50 ug via INTRAVENOUS

## 2014-04-28 MED ORDER — ONDANSETRON HCL 4 MG/2ML IJ SOLN: 4.0000 mg | Freq: Once | INTRAMUSCULAR | Status: DC | PRN

## 2014-04-28 MED ORDER — BUPIVACAINE HCL (PF) 0.5 % IJ SOLN
INTRAMUSCULAR | Status: DC | PRN
  Administered 2014-04-28: 8 mL

## 2014-04-28 SURGICAL SUPPLY — 43 items
BAG HAMPER (MISCELLANEOUS) ×3 IMPLANT
BANDAGE ELASTIC 4 VELCRO NS (GAUZE/BANDAGES/DRESSINGS) ×3 IMPLANT
BANDAGE ESMARK 4X12 BL STRL LF (DISPOSABLE) ×1 IMPLANT
BLADE 15 SAFETY STRL DISP (BLADE) IMPLANT
BLADE AVERAGE 25MMX9MM (BLADE)
BLADE AVERAGE 25X9 (BLADE) IMPLANT
BNDG CMPR 12X4 ELC STRL LF (DISPOSABLE) ×1
BNDG CONFORM 2 STRL LF (GAUZE/BANDAGES/DRESSINGS) ×3 IMPLANT
BNDG ESMARK 4X12 BLUE STRL LF (DISPOSABLE) ×3
BNDG GAUZE ELAST 4 BULKY (GAUZE/BANDAGES/DRESSINGS) ×3 IMPLANT
CLOTH BEACON ORANGE TIMEOUT ST (SAFETY) ×3 IMPLANT
COVER LIGHT HANDLE STERIS (MISCELLANEOUS) ×6 IMPLANT
CUFF TOURNIQUET SINGLE 18IN (TOURNIQUET CUFF) ×3 IMPLANT
DECANTER SPIKE VIAL GLASS SM (MISCELLANEOUS) ×3 IMPLANT
DRSG ADAPTIC 3X8 NADH LF (GAUZE/BANDAGES/DRESSINGS) ×3 IMPLANT
ELECT REM PT RETURN 9FT ADLT (ELECTROSURGICAL) ×3
ELECTRODE REM PT RTRN 9FT ADLT (ELECTROSURGICAL) ×1 IMPLANT
GAUZE KERLIX 2  STERILE LF (GAUZE/BANDAGES/DRESSINGS) ×2 IMPLANT
GAUZE SPONGE 4X4 12PLY STRL (GAUZE/BANDAGES/DRESSINGS) ×3 IMPLANT
GLOVE BIO SURGEON STRL SZ 6.5 (GLOVE) ×2 IMPLANT
GLOVE BIO SURGEON STRL SZ7.5 (GLOVE) ×3 IMPLANT
GLOVE BIO SURGEONS STRL SZ 6.5 (GLOVE) ×1
GLOVE BIOGEL PI IND STRL 7.0 (GLOVE) IMPLANT
GLOVE BIOGEL PI INDICATOR 7.0 (GLOVE) ×2
GLOVE EXAM NITRILE MD LF STRL (GLOVE) ×2 IMPLANT
GOWN STRL REUS W/TWL LRG LVL3 (GOWN DISPOSABLE) ×6 IMPLANT
KIT ROOM TURNOVER APOR (KITS) ×3 IMPLANT
MANIFOLD NEPTUNE II (INSTRUMENTS) ×3 IMPLANT
NDL HYPO 27GX1-1/4 (NEEDLE) ×2 IMPLANT
NEEDLE HYPO 27GX1-1/4 (NEEDLE) ×6 IMPLANT
NS IRRIG 1000ML POUR BTL (IV SOLUTION) ×3 IMPLANT
PACK BASIC LIMB (CUSTOM PROCEDURE TRAY) ×3 IMPLANT
PAD ARMBOARD 7.5X6 YLW CONV (MISCELLANEOUS) ×3 IMPLANT
PADDING WEBRIL 2 STERILE (GAUZE/BANDAGES/DRESSINGS) ×2 IMPLANT
RASP SM TEAR CROSS CUT (RASP) IMPLANT
SET BASIN LINEN APH (SET/KITS/TRAYS/PACK) ×3 IMPLANT
SOL PREP PROV IODINE SCRUB 4OZ (MISCELLANEOUS) ×3 IMPLANT
SPONGE GAUZE 4X4 12PLY (GAUZE/BANDAGES/DRESSINGS) ×1 IMPLANT
SPONGE LAP 18X18 X RAY DECT (DISPOSABLE) ×3 IMPLANT
SUT ETHILON 4 0 PS 2 18 (SUTURE) IMPLANT
SUT PROLENE 4 0 PS 2 18 (SUTURE) ×5 IMPLANT
SUT VIC AB 4-0 PS2 27 (SUTURE) IMPLANT
SYRINGE CONTROL L 12CC (SYRINGE) ×6 IMPLANT

## 2014-04-28 NOTE — Anesthesia Postprocedure Evaluation (Signed)
  Anesthesia Post-op Note  Patient: [name]  Procedure(s) Performed: Procedure(s): 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 (Left)  Patient Location: PACU  Anesthesia Type:MAC  Level of Consciousness: awake, alert , oriented and patient cooperative  Airway and Oxygen Therapy: Patient Spontanous Breathing and Patient connected to face mask oxygen  Post-op Pain: none  Post-op Assessment: Post-op Vital signs reviewed, Patient's Cardiovascular Status Stable, Respiratory Function Stable, Patent Airway, No signs of Nausea or vomiting and Pain level controlled  Post-op Vital Signs: Reviewed and stable  Last Vitals:  Filed Vitals:   04/28/14 0830  BP: 107/71  Temp:   Resp: 14    Complications: No apparent anesthesia complications

## 2014-04-28 NOTE — Progress Notes (Signed)
Patient states he has "bug bites on both legs" sites red and hot to touch.  Dr. Nolen MuMckinney notified and SCD's not applied per his order. Dr. Nolen MuMckinney assess sites.

## 2014-04-28 NOTE — Anesthesia Preprocedure Evaluation (Signed)
Anesthesia Evaluation  Patient identified by MRN, date of birth, ID band Patient awake    Reviewed: Allergy & Precautions, H&P , NPO status , Patient's Chart, lab work & pertinent test results  Airway Mallampati: I TM Distance: >3 FB     Dental  (+) Teeth Intact   Pulmonary Current Smoker,  breath sounds clear to auscultation        Cardiovascular negative cardio ROS  Rhythm:Regular     Neuro/Psych Chronic pain -LBP, spinal fusion     GI/Hepatic negative GI ROS,   Endo/Other    Renal/GU      Musculoskeletal   Abdominal   Peds  Hematology   Anesthesia Other Findings   Reproductive/Obstetrics                           Anesthesia Physical Anesthesia Plan  ASA: II  Anesthesia Plan: MAC   Post-op Pain Management:    Induction: Intravenous  Airway Management Planned: Nasal Cannula  Additional Equipment:   Intra-op Plan:   Post-operative Plan:   Informed Consent: I have reviewed the patients History and Physical, chart, labs and discussed the procedure including the risks, benefits and alternatives for the proposed anesthesia with the patient or authorized representative who has indicated his/her understanding and acceptance.     Plan Discussed with:   Anesthesia Plan Comments:         Anesthesia Quick Evaluation

## 2014-04-28 NOTE — Anesthesia Procedure Notes (Signed)
Procedure Name: MAC Date/Time: 04/28/2014 8:37 AM Performed by: Pernell DupreADAMS, Thomas Noorani A Pre-anesthesia Checklist: Patient identified, Timeout performed, Emergency Drugs available, Suction available and Patient being monitored Oxygen Delivery Method: Simple face mask

## 2014-04-28 NOTE — Op Note (Signed)
OPERATIVE NOTE  DATE OF PROCEDURE:  04/28/2014  SURGEON:   Dallas SchimkeBenjamin Ivan Kynzee Devinney, DPM  OR STAFF:   Circulator: Horton MarshallKatie S Woods, RN Scrub Person: Diana EvesWendy J Cain, CST Circulator Assistant: Lizabeth LeydenBetty M Ashley, RN   PREOPERATIVE DIAGNOSIS:   1.  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  Pain, left foot (ICD-9 729.5)  POSTOPERATIVE DIAGNOSIS: Same  PROCEDURE: Amputation of the 5th digit, left foot (CPT 28820-T4)  ANESTHESIA:  Monitor Anesthesia Care   HEMOSTASIS:   Pneumatic ankle tourniquet set at 250 mmHg  ESTIMATED BLOOD LOSS:   Minimal (<5 cc)  MATERIALS USED:  None  INJECTABLES: Marcaine 0.5% plain  PATHOLOGY:   5th toe, left foot  COMPLICATIONS:   None  INDICATIONS:  Painful hammertoe deformity of the 5th digit of the left foot.    DESCRIPTION OF THE PROCEDURE:   The patient was brought to the operating room and placed on the operative table in the supine position.  A pneumatic ankle tourniquet was applied to the patient's ankle.  Following sedation, the surgical site was anesthetized with 0.5% Marcaine plain.  The foot was then prepped, scrubbed, and draped in the usual sterile technique.  The foot was elevated, exsanguinated and the pneumatic ankle tourniquet inflated to 250 mmHg.    Attention was directed to the left fifth toe.  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  A hyperkeratotic lesion was present along the lateral aspect of the left fifth toe.  2 converging semi-with goal incisions were performed encompassing the digit circumferentially.  Dissection was continued deep down to level of the fifth metatarsophalangeal joint.  The fifth metatarsophalangeal joint was disarticulated.  The toe was passed from the operative field and sent to pathology for evaluation.  The surgical wound was irrigated with Amounts of Sterile Irrigant.  The skin was reapproximated using 4-0 Prolene in a simple suture technique.  A sterile  compressive dressing was applied to the operative foot.  The pneumatic ankle tourniquet was deflated and a prompt hyperemic response was noted to all digits of the operative foot.   The patient tolerated the procedure well.  The patient was then transferred to PACU with vital signs stable and vascular status intact to all toes of the operative foot.  Following a period of postoperative monitoring, the patient will be discharged home.

## 2014-04-28 NOTE — H&P (Signed)
HISTORY AND PHYSICAL INTERVAL NOTE:  04/28/2014  8:29 AM  [name]  has presented today for surgery, with the diagnosis of hammer toe 5th toe left foot and pain.  The various methods of treatment have been discussed with the patient.  No guarantees were given.  After consideration of risks, benefits and other options for treatment, the patient has consented to surgery.  I have reviewed the patients' chart and labs.    Patient Vitals for the past 24 hrs:  BP Temp Temp src Resp SpO2  04/28/14 0824 99/71 mmHg 97.5 F (36.4 C) Oral 18 99 %    A history and physical examination was performed in my office.  The patient was reexamined.  There have been no changes to this history and physical examination.  Dallas SchimkeBenjamin Ivan Daysean Tinkham, DPM

## 2014-04-28 NOTE — Transfer of Care (Signed)
Immediate Anesthesia Transfer of Care Note  Patient: [name]  Procedure(s) Performed: Procedure(s): 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 (Left)  Patient Location: PACU  Anesthesia Type:MAC  Level of Consciousness: awake, alert , oriented and patient cooperative  Airway & Oxygen Therapy: Patient Spontanous Breathing and Patient connected to face mask oxygen  Post-op Assessment: Report given to PACU RN and Post -op Vital signs reviewed and stable  Post vital signs: Reviewed and stable  Complications: No apparent anesthesia complications

## 2014-04-30 ENCOUNTER — Encounter (HOSPITAL_COMMUNITY): Payer: Self-pay | Admitting: Podiatry

## 2014-05-20 ENCOUNTER — Encounter: Payer: Self-pay | Admitting: Nurse Practitioner

## 2014-05-20 ENCOUNTER — Ambulatory Visit (INDEPENDENT_AMBULATORY_CARE_PROVIDER_SITE_OTHER): Payer: Medicare Other | Admitting: Nurse Practitioner

## 2014-05-20 VITALS — BP 116/78 | Temp 98.2°F | Ht 75.0 in | Wt 182.0 lb

## 2014-05-20 DIAGNOSIS — J300 Vasomotor rhinitis: Secondary | ICD-10-CM

## 2014-05-20 DIAGNOSIS — J309 Allergic rhinitis, unspecified: Secondary | ICD-10-CM | POA: Diagnosis not present

## 2014-05-20 DIAGNOSIS — J029 Acute pharyngitis, unspecified: Secondary | ICD-10-CM

## 2014-05-20 MED ORDER — AZITHROMYCIN 250 MG PO TABS: ORAL_TABLET | ORAL | Status: DC

## 2014-05-20 NOTE — Progress Notes (Signed)
Subjective:  Presents for complaints of sore throat low-grade fever body aches that began yesterday. Was exposed to strep throat earlier this week. Occasional cough. Slight runny nose. Slight seasonal allergies for the past couple of weeks. No ear pain. No headache. No vomiting diarrhea or abdominal pain. Taking fluids well.  Objective:   BP 116/78  Temp(Src) 98.2 F (36.8 C)  Ht  (1.905 m)  Wt 182 lb (82.555 kg)  BMI 22.75 kg/m2 NAD. Alert, oriented. TMs clear effusion, no erythema. Pharynx uvula is slightly swollen with moderate erythema and 2 pockets of white exudate. Above this for 2 tiny clear fluid filled lesions. Oral mucosa is clear. Neck supple with mild soft nontender adenopathy. Lungs clear. Heart regular rate rhythm.  Assessment: Acute pharyngitis, unspecified pharyngitis type  Vasomotor rhinitis  Plan:  Meds ordered this encounter  Medications  . azithromycin (ZITHROMAX Z-PAK) 250 MG tablet    Sig: Take 2 tablets (500 mg) on  Day 1,  followed by 1 tablet (250 mg) once daily on Days 2 through 5.    Dispense:  6 each    Refill:  0    Order Specific Question:  Supervising Provider    Answer:  Merlyn Albert [[id number]]   restart Flonase as directed. Call back in 4 days if no improvement, sooner if worse.

## 2014-05-25 ENCOUNTER — Telehealth: Payer: Self-pay | Admitting: *Deleted

## 2014-05-25 DIAGNOSIS — J029 Acute pharyngitis, unspecified: Secondary | ICD-10-CM

## 2014-05-25 NOTE — Telephone Encounter (Signed)
Pt seen 05/20/14. Pt states he was diagnosed with strep but in notes he was diagnosed with vasomotor rhinitis and no strep was done. Prescribed z pack and finished med. Pt states he feels worse. Still having sore throat and fever 102. Sleeping a lot. Can something be called into [facility] [place] or does he need a recheck.

## 2014-05-25 NOTE — Telephone Encounter (Signed)
Notified patient he was seen for acute pharyngitis (main problem) with vasomotor rhinitis. Went ahead and treated for strep due to upcoming weekend and he was exposed to strep. However, with persistent symptoms recommend CBC with diff and mono spot test. Needs appt for recheck within next 2 days. Make sure he is drinking plenty of fluids and urinating without difficulty. Patient transferred to front desk to schedule appointment.

## 2014-05-25 NOTE — Telephone Encounter (Signed)
He was seen for acute pharyngitis (main problem) with vasomotor rhinitis. Went ahead and treated for strep due to upcoming weekend and he was exposed to strep. However, with persistent symptoms recommend CBC with diff and mono spot test. Please schedule appt for recheck within next 2 days. Make sure he is drinking plenty of fluids and urinating without difficulty. Thanks.

## 2014-05-25 NOTE — Telephone Encounter (Signed)
Left message on voicemail to return call. (Bloodwork ordered)

## 2014-05-27 ENCOUNTER — Ambulatory Visit: Payer: Medicare Other | Admitting: Nurse Practitioner

## 2014-06-23 ENCOUNTER — Other Ambulatory Visit: Payer: Self-pay | Admitting: Family Medicine

## 2014-07-14 ENCOUNTER — Ambulatory Visit (INDEPENDENT_AMBULATORY_CARE_PROVIDER_SITE_OTHER): Payer: Medicare Other | Admitting: Family Medicine

## 2014-07-14 ENCOUNTER — Encounter: Payer: Self-pay | Admitting: Family Medicine

## 2014-07-14 VITALS — BP 130/86 | Ht 74.0 in | Wt 181.4 lb

## 2014-07-14 DIAGNOSIS — G8929 Other chronic pain: Secondary | ICD-10-CM | POA: Diagnosis not present

## 2014-07-14 DIAGNOSIS — Z23 Encounter for immunization: Secondary | ICD-10-CM | POA: Diagnosis not present

## 2014-07-14 DIAGNOSIS — E785 Hyperlipidemia, unspecified: Secondary | ICD-10-CM | POA: Diagnosis not present

## 2014-07-14 DIAGNOSIS — M545 Low back pain: Secondary | ICD-10-CM

## 2014-07-14 DIAGNOSIS — Z79899 Other long term (current) drug therapy: Secondary | ICD-10-CM

## 2014-07-14 MED ORDER — OXYCODONE HCL 10 MG PO TABS: 10.0000 mg | ORAL_TABLET | ORAL | Status: DC | PRN

## 2014-07-14 MED ORDER — CYCLOBENZAPRINE HCL 10 MG PO TABS: ORAL_TABLET | ORAL | Status: DC

## 2014-07-14 NOTE — Progress Notes (Signed)
   Subjective:    Patient ID: [name], male    DOB: Sep 09, 1971, 43 y.o.   MRN: [id number]  HPI This patient was seen today for chronic pain  The medication list was reviewed and updated.   -Compliance with pain medication: yes  The patient was advised the importance of maintaining medication and not using illegal substances with these.  Refills needed: yes  The patient was educated that we can provide 3 monthly scripts for their medication, it is their responsibility to follow the instructions.  Side effects or complications from medications: no  Patient is aware that pain medications are meant to minimize the severity of the pain to allow their pain levels to improve to allow for better function. They are aware of that pain medications cannot totally remove their pain.  Due for UDT ( at least once per year) : Next visit    His hyperlipidemia was discussed. Importance of maintaining a healthy diet minimizing starches staying physically active and avoiding smoking was discussed    Review of Systems  Constitutional: Negative for activity change, appetite change and fatigue.  HENT: Negative for congestion.   Respiratory: Negative for cough.   Cardiovascular: Negative for chest pain.  Gastrointestinal: Negative for abdominal pain.  Endocrine: Negative for polydipsia and polyphagia.  Neurological: Negative for weakness.  Psychiatric/Behavioral: Negative for confusion.       Objective:   Physical Exam  Vitals reviewed. Constitutional: He appears well-nourished. No distress.  Cardiovascular: Normal rate, regular rhythm and normal heart sounds.   No murmur heard. Pulmonary/Chest: Effort normal and breath sounds normal. No respiratory distress.  Musculoskeletal: He exhibits no edema.  Lymphadenopathy:    He has no cervical adenopathy.  Neurological: He is alert.  Psychiatric: His behavior is normal.          Assessment & Plan:  Chronic pain management  prescriptions are given instructions to follow closely follow-up in approximately 3 months

## 2014-07-14 NOTE — Patient Instructions (Signed)
As part of your visit today we have covered your chronic pain. You have been given prescription(s) for pain medicines.The [place]A and [facility] require that any patient on pain medications must be seen every 3 months. You are expected to come in for a office visit before further pain medications are issued.   We will not refill medications or early nor will we give an extended month supply at the end of these prescriptions.It is your responsibility to keep up with medications. They will not be replaced.  It is your responsibility to schedule an office visit in 3-4 months to be seen before you are out of your medication. Do not call our office to request early refills or additional refills. Do not wait till the last moment to schedule the follow up visit. We highly recommend you schedule this now for 3 months.  We believe that most patients take their meds as prescribed but drug misuse and diversion is a serious problem in the [place]. Our office does standard measures to insure proper care to all. All patients are subject to random urine drug screens and random pill counts. Also all patients drug prescription records are reviewed on a regular basis in accordance with [facility] medical [facility] policies.  Remember, do not use alcohol or illegal drugs with your pain medications.    We are required by law to adhere to strict regulations. Failure on our part to follow these regulations could jeopardize our prescription license which in turn would cause us not to be able to care for you.Thank you for your understanding and following these policies.   Smoking Cessation Quitting smoking is important to your health and has many advantages. However, it is not always easy to quit since nicotine is a very addictive drug. Oftentimes, people try 3 times or more before being able to quit. This document explains the best ways for you to prepare to quit smoking. Quitting takes hard work and a lot of effort, but you can do  it. ADVANTAGES OF QUITTING SMOKING  You will live longer, feel better, and live better.  Your body will feel the impact of quitting smoking almost immediately.  Within 20 minutes, blood pressure decreases. Your pulse returns to its normal level.  After 8 hours, carbon monoxide levels in the blood return to normal. Your oxygen level increases.  After 24 hours, the chance of having a heart attack starts to decrease. Your breath, hair, and body stop smelling like smoke.  After 48 hours, damaged nerve endings begin to recover. Your sense of taste and smell improve.  After 72 hours, the body is virtually free of nicotine. Your bronchial tubes relax and breathing becomes easier.  After 2 to 12 weeks, lungs can hold more air. Exercise becomes easier and circulation improves.  The risk of having a heart attack, stroke, cancer, or lung disease is greatly reduced.  After 1 year, the risk of coronary heart disease is cut in half.  After 5 years, the risk of stroke falls to the same as a nonsmoker.  After 10 years, the risk of lung cancer is cut in half and the risk of other cancers decreases significantly.  After 15 years, the risk of coronary heart disease drops, usually to the level of a nonsmoker.  If you are pregnant, quitting smoking will improve your chances of having a healthy baby.  The people you live with, especially any children, will be healthier.  You will have extra money to spend on things  other than cigarettes. QUESTIONS TO THINK ABOUT BEFORE ATTEMPTING TO QUIT You may want to talk about your answers with your health care provider.  Why do you want to quit?  If you tried to quit in the past, what helped and what did not?  What will be the most difficult situations for you after you quit? How will you plan to handle them?  Who can help you through the tough times? Your family? Friends? A health care provider?  What pleasures do you get from smoking? What ways can you  still get pleasure if you quit? Here are some questions to ask your health care provider:  How can you help me to be successful at quitting?  What medicine do you think would be best for me and how should I take it?  What should I do if I need more help?  What is smoking withdrawal like? How can I get information on withdrawal? GET READY  Set a quit date.  Change your environment by getting rid of all cigarettes, ashtrays, matches, and lighters in your home, car, or work. Do not let people smoke in your home.  Review your past attempts to quit. Think about what worked and what did not. GET SUPPORT AND ENCOURAGEMENT You have a better chance of being successful if you have help. You can get support in many ways.  Tell your family, friends, and coworkers that you are going to quit and need their support. Ask them not to smoke around you.  Get individual, group, or telephone counseling and support. Programs are available at [facility] and health [facility]. Call your local health department for information about programs in your area.  Spiritual beliefs and practices may help some smokers quit.  Download a "quit meter" on your computer to keep track of quit statistics, such as how long you have gone without smoking, cigarettes not smoked, and money saved.  Get a self-help book about quitting smoking and staying off tobacco. LEARN NEW SKILLS AND BEHAVIORS  Distract yourself from urges to smoke. Talk to someone, go for a walk, or occupy your time with a task.  Change your normal routine. Take a different route to work. Drink tea instead of coffee. Eat breakfast in a different place.  Reduce your stress. Take a hot bath, exercise, or read a book.  Plan something enjoyable to do every day. Reward yourself for not smoking.  Explore interactive web-based programs that specialize in helping you quit. GET MEDICINE AND USE IT CORRECTLY Medicines can help you stop smoking and decrease  the urge to smoke. Combining medicine with the above behavioral methods and support can greatly increase your chances of successfully quitting smoking.  Nicotine replacement therapy helps deliver nicotine to your body without the negative effects and risks of smoking. Nicotine replacement therapy includes nicotine gum, lozenges, inhalers, nasal sprays, and skin patches. Some may be available over-the-counter and others require a prescription.  Antidepressant medicine helps people abstain from smoking, but how this works is unknown. This medicine is available by prescription.  Nicotinic receptor partial agonist medicine simulates the effect of nicotine in your brain. This medicine is available by prescription. Ask your health care provider for advice about which medicines to use and how to use them based on your health history. Your health care provider will tell you what side effects to look out for if you choose to be on a medicine or therapy. Carefully read the information on the package. Do not use any  other product containing nicotine while using a nicotine replacement product.  RELAPSE OR DIFFICULT SITUATIONS Most relapses occur within the first 3 months after quitting. Do not be discouraged if you start smoking again. Remember, most people try several times before finally quitting. You may have symptoms of withdrawal because your body is used to nicotine. You may crave cigarettes, be irritable, feel very hungry, cough often, get headaches, or have difficulty concentrating. The withdrawal symptoms are only temporary. They are strongest when you first quit, but they will go away within 10-14 days. To reduce the chances of relapse, try to:  Avoid drinking alcohol. Drinking lowers your chances of successfully quitting.  Reduce the amount of caffeine you consume. Once you quit smoking, the amount of caffeine in your body increases and can give you symptoms, such as a rapid heartbeat, sweating, and  anxiety.  Avoid smokers because they can make you want to smoke.  Do not let weight gain distract you. Many smokers will gain weight when they quit, usually less than 10 pounds. Eat a healthy diet and stay active. You can always lose the weight gained after you quit.  Find ways to improve your mood other than smoking. FOR MORE INFORMATION  www.smokefree.gov  Document Released: 08/28/2001 Document Revised: 01/18/2014 Document Reviewed: 12/13/2011 [facility] Patient Information 2015 [place], [place]. This information is not intended to replace advice given to you by your health care provider. Make sure you discuss any questions you have with your health care provider.

## 2014-07-16 DIAGNOSIS — Z79899 Other long term (current) drug therapy: Secondary | ICD-10-CM | POA: Diagnosis not present

## 2014-07-16 DIAGNOSIS — E785 Hyperlipidemia, unspecified: Secondary | ICD-10-CM | POA: Diagnosis not present

## 2014-07-16 LAB — LIPID PANEL
Cholesterol: 159 mg/dL (ref 0–200)
HDL: 37 mg/dL — ABNORMAL LOW (ref >39)
LDL Cholesterol: 108 mg/dL — ABNORMAL HIGH (ref 0–99)
Total CHOL/HDL Ratio: 4.3 Ratio
Triglycerides: 68 mg/dL (ref <150)
VLDL: 14 mg/dL (ref 0–40)

## 2014-07-16 LAB — BASIC METABOLIC PANEL
BUN: 15 mg/dL (ref 6–23)
CHLORIDE: 110 meq/L (ref 96–112)
CO2: 24 meq/L (ref 19–32)
Calcium: 9.7 mg/dL (ref 8.4–10.5)
Creat: 0.75 mg/dL (ref 0.50–1.35)
GLUCOSE: 90 mg/dL (ref 70–99)
Potassium: 4.8 mEq/L (ref 3.5–5.3)
Sodium: 140 mEq/L (ref 135–145)

## 2014-07-17 ENCOUNTER — Encounter: Payer: Self-pay | Admitting: Family Medicine

## 2014-08-06 ENCOUNTER — Emergency Department (HOSPITAL_COMMUNITY)
Admission: EM | Admit: 2014-08-06 | Discharge: 2014-08-06 | Disposition: A | Discharge status: Home / Self Care | Payer: Medicare Other | Attending: Emergency Medicine | Admitting: Emergency Medicine

## 2014-08-06 ENCOUNTER — Encounter (HOSPITAL_COMMUNITY): Payer: Self-pay | Admitting: *Deleted

## 2014-08-06 DIAGNOSIS — M1990 Unspecified osteoarthritis, unspecified site: Secondary | ICD-10-CM | POA: Insufficient documentation

## 2014-08-06 DIAGNOSIS — Z7952 Long term (current) use of systemic steroids: Secondary | ICD-10-CM | POA: Insufficient documentation

## 2014-08-06 DIAGNOSIS — G8929 Other chronic pain: Secondary | ICD-10-CM | POA: Insufficient documentation

## 2014-08-06 DIAGNOSIS — Z720 Tobacco use: Secondary | ICD-10-CM | POA: Diagnosis not present

## 2014-08-06 DIAGNOSIS — J029 Acute pharyngitis, unspecified: Secondary | ICD-10-CM | POA: Diagnosis not present

## 2014-08-06 MED ORDER — HYDROCODONE-ACETAMINOPHEN 5-325 MG PO TABS: 1.0000 | ORAL_TABLET | ORAL | Status: DC | PRN

## 2014-08-06 MED ORDER — PENICILLIN V POTASSIUM 250 MG PO TABS
500.0000 mg | ORAL_TABLET | Freq: Once | ORAL | Status: AC
Start: 2014-08-06 — End: 2014-08-06
  Administered 2014-08-06: 500 mg via ORAL
  Filled 2014-08-06: qty 2

## 2014-08-06 MED ORDER — PENICILLIN V POTASSIUM 500 MG PO TABS: 500.0000 mg | ORAL_TABLET | Freq: Four times a day (QID) | ORAL | Status: AC

## 2014-08-06 NOTE — ED Notes (Signed)
Sore throat for 3 days, chills,

## 2014-08-06 NOTE — Discharge Instructions (Signed)
Continue to take advil in addition to the medications we give you tonight.

## 2014-08-06 NOTE — ED Provider Notes (Signed)
CSN: [id number]     Arrival date & time 08/06/14  2015 History   First MD Initiated Contact with Patient 08/06/14 2030     Chief Complaint  Patient presents with  . Sore Throat     (Consider location/radiation/quality/duration/timing/severity/associated sxs/prior Treatment) Patient is a 43 y.o. male presenting with pharyngitis. The history is provided by the patient.  Sore Throat This is a new problem. The current episode started in the past 7 days. The problem occurs constantly. The problem has been gradually worsening. Associated symptoms include chills, a fever, a sore throat and swollen glands.   Sharl Maimothy A Beaulac is a 43 y.o. male who presents to the ED with a sore throat, fever and chills that started 3 days ago. His wife is home with strep throat. He denies any other problems.  Past Medical History  Diagnosis Date  . Chronic back pain   . History of spinal fusion   . History of degenerative disc disease   . Arthritis    Past Surgical History  Procedure Laterality Date  . Spinal fusion    . 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 History  Substance Use Topics  . Smoking status: Current Every Day Smoker  . Smokeless tobacco: Not on file  . Alcohol Use: No    Review of Systems  Constitutional: Positive for fever and chills.  HENT: Positive for sore throat. Negative for trouble swallowing.   all other systems negative     Allergies  Toradol  Home Medications   Prior to Admission medications   Medication Sig Start Date End Date Taking? Authorizing Provider  cyclobenzaprine (FLEXERIL) 10 MG tablet TAKE 1 TABLET BY MOUTH THREE TIMES DAILY AS NEEDED FOR MUSCLE SPASMS 07/14/14   Babs SciaraScott A Luking, MD  fluticasone (FLONASE) 50 MCG/ACT nasal spray Place 2 sprays into the nose daily. 01/09/13   Babs SciaraScott A Luking, MD   Ketotifen Fumarate (ALLERGY EYE DROPS OP) Apply 2 drops to eye daily as needed (allergies).    Historical Provider, MD  naproxen (NAPROSYN) 500 MG tablet Take 1 tablet (500 mg total) by mouth 2 (two) times daily with a meal. 10/16/13   Babs SciaraScott A Luking, MD  Oxycodone HCl 10 MG TABS Take 1 tablet (10 mg total) by mouth every 4 (four) hours as needed. 07/14/14   Babs SciaraScott A Luking, MD   BP 98/69 mmHg  Pulse 73  Temp(Src) 98.1 F (36.7 C) (Oral)  Resp 18  Ht 6\' 1"  (1.854 m)  Wt 180 lb (81.647 kg)  BMI 23.75 kg/m2  SpO2 96% Physical Exam  Constitutional: He is oriented to person, place, and time. He appears well-developed and well-nourished.  HENT:  Head: Normocephalic.  Right Ear: Tympanic membrane normal.  Left Ear: Tympanic membrane normal.  Nose: Nose normal.  Mouth/Throat: Uvula is midline and mucous membranes are normal. Posterior oropharyngeal erythema present.  Eyes: Conjunctivae and EOM are normal.  Neck: Normal range of motion. Neck supple.  Cardiovascular: Normal rate.   Pulmonary/Chest: Effort normal. He has no wheezes. He has no rales.  Musculoskeletal: Normal range of motion.  Lymphadenopathy:    He has cervical adenopathy.  Neurological: He is alert and oriented to person, place, and time. No cranial nerve deficit.  Skin: Skin is warm and dry.  Psychiatric: He has a normal mood and affect. His behavior  is normal.  Nursing note and vitals reviewed.   ED Course  Procedures   MDM  43 y.o. male with sore throat, fever and chills x 3 days. His wife is home with strep throat. Will treat for strep and he will follow up with his PCP as needed. Stable for discharge without stiff neck or other meningeal signs. Discussed with the patient clinical findings and plan of care and all questioned fully answered. He will return here if any problems arise.    Medication List    TAKE these medications        HYDROcodone-acetaminophen 5-325 MG per tablet  Commonly known as:   NORCO/VICODIN  Take 1 tablet by mouth every 4 (four) hours as needed.     penicillin v potassium 500 MG tablet  Commonly known as:  VEETID  Take 1 tablet (500 mg total) by mouth 4 (four) times daily.      ASK your doctor about these medications        ALLERGY EYE DROPS OP  Apply 2 drops to eye daily as needed (allergies).     cyclobenzaprine 10 MG tablet  Commonly known as:  FLEXERIL  TAKE 1 TABLET BY MOUTH THREE TIMES DAILY AS NEEDED FOR MUSCLE SPASMS     fluticasone 50 MCG/ACT nasal spray  Commonly known as:  FLONASE  Place 2 sprays into the nose daily.     naproxen 500 MG tablet  Commonly known as:  NAPROSYN  Take 1 tablet (500 mg total) by mouth 2 (two) times daily with a meal.     Oxycodone HCl 10 MG Tabs  Take 1 tablet (10 mg total) by mouth every 4 (four) hours as needed.            [place] M Daneya [place], [place] 08/06/14 2045  Donnetta HutchingBrian Cook, MD 08/06/14 2131

## 2014-08-06 NOTE — ED Notes (Signed)
Patient given discharge instruction, verbalized understand. Patient ambulatory out of the department.  

## 2014-08-09 ENCOUNTER — Encounter (HOSPITAL_COMMUNITY): Payer: Self-pay

## 2014-08-09 ENCOUNTER — Emergency Department (HOSPITAL_COMMUNITY)
Admission: EM | Admit: 2014-08-09 | Discharge: 2014-08-09 | Disposition: A | Discharge status: Home / Self Care | Payer: Medicare Other | Attending: Emergency Medicine | Admitting: Emergency Medicine

## 2014-08-09 DIAGNOSIS — Z79899 Other long term (current) drug therapy: Secondary | ICD-10-CM | POA: Insufficient documentation

## 2014-08-09 DIAGNOSIS — Z981 Arthrodesis status: Secondary | ICD-10-CM | POA: Diagnosis not present

## 2014-08-09 DIAGNOSIS — G8929 Other chronic pain: Secondary | ICD-10-CM | POA: Diagnosis not present

## 2014-08-09 DIAGNOSIS — J029 Acute pharyngitis, unspecified: Secondary | ICD-10-CM | POA: Diagnosis present

## 2014-08-09 DIAGNOSIS — Z791 Long term (current) use of non-steroidal anti-inflammatories (NSAID): Secondary | ICD-10-CM | POA: Diagnosis not present

## 2014-08-09 DIAGNOSIS — Z7951 Long term (current) use of inhaled steroids: Secondary | ICD-10-CM | POA: Diagnosis not present

## 2014-08-09 DIAGNOSIS — M1990 Unspecified osteoarthritis, unspecified site: Secondary | ICD-10-CM | POA: Insufficient documentation

## 2014-08-09 DIAGNOSIS — J020 Streptococcal pharyngitis: Secondary | ICD-10-CM | POA: Diagnosis not present

## 2014-08-09 DIAGNOSIS — Z792 Long term (current) use of antibiotics: Secondary | ICD-10-CM | POA: Diagnosis not present

## 2014-08-09 DIAGNOSIS — Z720 Tobacco use: Secondary | ICD-10-CM | POA: Diagnosis not present

## 2014-08-09 MED ORDER — FIRST-DUKES MOUTHWASH MT SUSP: 10.0000 mL | OROMUCOSAL | Status: DC | PRN

## 2014-08-09 MED ORDER — OXYCODONE-ACETAMINOPHEN 5-325 MG PO TABS: 1.0000 | ORAL_TABLET | ORAL | Status: DC | PRN

## 2014-08-09 NOTE — Discharge Instructions (Signed)

## 2014-08-09 NOTE — ED Provider Notes (Signed)
CSN: [id number]     Arrival date & time 08/09/14  1540 History  This chart was scribed for non-physician practitioner Burgess AmorJulie Arria Naim, PA-C working with Vanetta MuldersScott Zackowski, MD by Littie Deedsichard Sun, ED Scribe. This patient was seen in room [facility] and the patient's care was started at 5:25 PM.    Chief Complaint  Patient presents with  . Sore Throat    The history is provided by the patient. No language interpreter was used.   HPI Comments: [name] is a 43 y.o. male who presents to the Emergency Department complaining of gradual onset sore throat that began 6 days ago. Patient was seen here and started on treatment for strep throat 3 days ago having taken 9 pcn tablets (taking qid). He reports still having sore throat as well as painful swallowing and difficulty sleeping due to pain. Patient reports short-lived relief with hydrocodone; he has also taken Advil without relief. Patient has been trying saltwater gargles, eating soup and drinking coffee. He denies SOB. Patient is currently a smoker. He has had strep throat several times as a child, but he has not had it as an adult.   Past Medical History  Diagnosis Date  . Chronic back pain   . History of spinal fusion   . History of degenerative disc disease   . Arthritis    Past Surgical History  Procedure Laterality Date  . Spinal fusion    . 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 History  Substance Use Topics  . Smoking status: Current Every Day Smoker  . Smokeless tobacco: Not on file  . Alcohol Use: No    Review of Systems  HENT: Positive for sore throat and trouble swallowing. Negative for congestion.   Eyes: Negative.   Respiratory: Negative for chest tightness and shortness of breath.   Cardiovascular: Negative for chest pain.  Gastrointestinal: Negative for nausea and  abdominal pain.  Genitourinary: Negative.   Musculoskeletal: Negative for joint swelling, arthralgias and neck pain.  Skin: Negative.  Negative for rash and wound.  Neurological: Negative for dizziness, weakness, light-headedness, numbness and headaches.  Psychiatric/Behavioral: Positive for sleep disturbance.      Allergies  Toradol  Home Medications   Prior to Admission medications   Medication Sig Start Date End Date Taking? Authorizing Provider  cyclobenzaprine (FLEXERIL) 10 MG tablet TAKE 1 TABLET BY MOUTH THREE TIMES DAILY AS NEEDED FOR MUSCLE SPASMS 07/14/14   Babs SciaraScott A Luking, MD  Diphenhyd-Hydrocort-Nystatin (FIRST-DUKES MOUTHWASH) SUSP Use as directed 10 mLs in the mouth or throat every 4 (four) hours as needed (gargle and spit for throat pain relief). 08/09/14   Burgess AmorJulie Leveta Wahab, PA-C  fluticasone (FLONASE) 50 MCG/ACT nasal spray Place 2 sprays into the nose daily. 01/09/13   Babs SciaraScott A Luking, MD  HYDROcodone-acetaminophen (NORCO/VICODIN) 5-325 MG per tablet Take 1 tablet by mouth every 4 (four) hours as needed. 08/06/14   Hope Orlene OchM Neese, NP  Ketotifen Fumarate (ALLERGY EYE DROPS OP) Apply 2 drops to eye daily as needed (allergies).    Historical Provider, MD  naproxen (NAPROSYN) 500 MG tablet Take 1 tablet (500 mg total) by mouth 2 (two) times daily with a meal. 10/16/13   Babs SciaraScott A Luking, MD  Oxycodone HCl 10 MG TABS Take 1 tablet (10 mg total) by mouth every 4 (four) hours as needed. 07/14/14  Babs SciaraScott A Luking, MD  oxyCODONE-acetaminophen (PERCOCET/ROXICET) 5-325 MG per tablet Take 1 tablet by mouth every 4 (four) hours as needed. 08/09/14   Burgess AmorJulie Madeleyn Schwimmer, PA-C  penicillin v potassium (VEETID) 500 MG tablet Take 1 tablet (500 mg total) by mouth 4 (four) times daily. 08/06/14 08/13/14  Hope Orlene OchM Neese, NP   BP 99/71 mmHg  Pulse 76  Temp(Src) 98.4 F (36.9 C) (Oral)  Resp 18  Ht 6\' 1"  (1.854 m)  Wt 185 lb (83.915 kg)  BMI 24.41 kg/m2  SpO2 96% Physical Exam  Constitutional: He is oriented  to person, place, and time. He appears well-developed and well-nourished.  Patient appears fatigued.  HENT:  Head: Normocephalic and atraumatic.  Right Ear: Tympanic membrane and ear canal normal.  Left Ear: Tympanic membrane and ear canal normal.  Nose: No mucosal edema or rhinorrhea.  Mouth/Throat: Uvula is midline, oropharynx is clear and moist and mucous membranes are normal. No oropharyngeal exudate, posterior oropharyngeal edema, posterior oropharyngeal erythema or tonsillar abscesses.  Pharyngeal erythema with normal to small sized tonsils. He does have some cervical adenopathy.   Eyes: Conjunctivae are normal.  Cardiovascular: Normal rate and normal heart sounds.   Pulmonary/Chest: Effort normal. No respiratory distress. He has no wheezes. He has no rales.  Abdominal: Soft. There is no tenderness.  Musculoskeletal: Normal range of motion.  Lymphadenopathy:    He has cervical adenopathy.  Neurological: He is alert and oriented to person, place, and time.  Skin: Skin is warm and dry. No rash noted.  Psychiatric: He has a normal mood and affect.  Nursing note and vitals reviewed.   ED Course  Procedures  DIAGNOSTIC STUDIES: Oxygen Saturation is 96% on room air, adequate by my interpretation.    COORDINATION OF CARE: 5:37 PM-Discussed treatment plan which includes abx, numbing mouthwash and pain medication with pt at bedside and pt agreed to plan.   Labs Review Labs Reviewed - No data to display  Imaging Review No results found.   EKG Interpretation None      MDM   Final diagnoses:  Strep throat    Offered bicillin LA in place of pcn, pt prefers to continue with pcn.  Prescribed oxycodone, magic mouthwash for pain relief, rest, increased fluid intake.  Recheck by pcp if sx are not improving over the next 3 days.  He has no sig tonsillar hypertrophy, doubt prednisone would offer any additional relief.  He is taking advil.   I personally performed the services  described in this documentation, which was scribed in my presence. The recorded information has been reviewed and is accurate.   Burgess AmorJulie Lanell Dubie, PA-C 08/09/14 1801  Vanetta MuldersScott Zackowski, MD 08/09/14 2008

## 2014-08-09 NOTE — ED Notes (Signed)
Sore throat. Treated for strep 3 days ago. Pt reports still has pain in throat.

## 2014-08-13 ENCOUNTER — Emergency Department (HOSPITAL_COMMUNITY)
Admission: EM | Admit: 2014-08-13 | Discharge: 2014-08-13 | Disposition: A | Discharge status: Home / Self Care | Payer: Medicare Other | Attending: Emergency Medicine | Admitting: Emergency Medicine

## 2014-08-13 ENCOUNTER — Encounter (HOSPITAL_COMMUNITY): Payer: Self-pay | Admitting: *Deleted

## 2014-08-13 ENCOUNTER — Emergency Department (HOSPITAL_COMMUNITY): Payer: Medicare Other

## 2014-08-13 DIAGNOSIS — R52 Pain, unspecified: Secondary | ICD-10-CM | POA: Diagnosis not present

## 2014-08-13 DIAGNOSIS — Z792 Long term (current) use of antibiotics: Secondary | ICD-10-CM | POA: Diagnosis not present

## 2014-08-13 DIAGNOSIS — Z79899 Other long term (current) drug therapy: Secondary | ICD-10-CM | POA: Diagnosis not present

## 2014-08-13 DIAGNOSIS — J029 Acute pharyngitis, unspecified: Secondary | ICD-10-CM | POA: Insufficient documentation

## 2014-08-13 DIAGNOSIS — M1990 Unspecified osteoarthritis, unspecified site: Secondary | ICD-10-CM | POA: Insufficient documentation

## 2014-08-13 DIAGNOSIS — Z720 Tobacco use: Secondary | ICD-10-CM | POA: Insufficient documentation

## 2014-08-13 DIAGNOSIS — Z7952 Long term (current) use of systemic steroids: Secondary | ICD-10-CM | POA: Insufficient documentation

## 2014-08-13 DIAGNOSIS — Z791 Long term (current) use of non-steroidal anti-inflammatories (NSAID): Secondary | ICD-10-CM | POA: Insufficient documentation

## 2014-08-13 DIAGNOSIS — G8929 Other chronic pain: Secondary | ICD-10-CM | POA: Diagnosis not present

## 2014-08-13 DIAGNOSIS — R531 Weakness: Secondary | ICD-10-CM | POA: Diagnosis not present

## 2014-08-13 LAB — CBC WITH DIFFERENTIAL/PLATELET
Basophils Absolute: 0.1 10*3/uL (ref 0.0–0.1)
Basophils Relative: 1 % (ref 0–1)
EOS PCT: 4 % (ref 0–5)
Eosinophils Absolute: 0.4 10*3/uL (ref 0.0–0.7)
HCT: 45.7 % (ref 39.0–52.0)
Hemoglobin: 15.5 g/dL (ref 13.0–17.0)
LYMPHS ABS: 2.8 10*3/uL (ref 0.7–4.0)
LYMPHS PCT: 28 % (ref 12–46)
MCH: 31.1 pg (ref 26.0–34.0)
MCHC: 33.9 g/dL (ref 30.0–36.0)
MCV: 91.8 fL (ref 78.0–100.0)
MONO ABS: 0.7 10*3/uL (ref 0.1–1.0)
Monocytes Relative: 7 % (ref 3–12)
Neutro Abs: 5.8 10*3/uL (ref 1.7–7.7)
Neutrophils Relative %: 60 % (ref 43–77)
Platelets: 277 10*3/uL (ref 150–400)
RBC: 4.98 MIL/uL (ref 4.22–5.81)
RDW: 13.6 % (ref 11.5–15.5)
WBC: 9.8 10*3/uL (ref 4.0–10.5)

## 2014-08-13 LAB — BASIC METABOLIC PANEL
Anion gap: 12 (ref 5–15)
BUN: 13 mg/dL (ref 6–23)
CO2: 27 meq/L (ref 19–32)
Calcium: 9.7 mg/dL (ref 8.4–10.5)
Chloride: 102 mEq/L (ref 96–112)
Creatinine, Ser: 0.91 mg/dL (ref 0.50–1.35)
GFR calc Af Amer: >90 mL/min (ref >90)
GFR calc non Af Amer: >90 mL/min (ref >90)
GLUCOSE: 80 mg/dL (ref 70–99)
Potassium: 4 mEq/L (ref 3.7–5.3)
SODIUM: 141 meq/L (ref 137–147)

## 2014-08-13 LAB — MONONUCLEOSIS SCREEN: Mono Screen: NEGATIVE

## 2014-08-13 LAB — RAPID STREP SCREEN (MED CTR MEBANE ONLY): Streptococcus, Group A Screen (Direct): NEGATIVE

## 2014-08-13 MED ORDER — IOHEXOL 300 MG/ML  SOLN
75.0000 mL | Freq: Once | INTRAMUSCULAR | Status: AC | PRN
Start: 2014-08-13 — End: 2014-08-13
  Administered 2014-08-13: 75 mL via INTRAVENOUS

## 2014-08-13 MED ORDER — OXYCODONE-ACETAMINOPHEN 5-325 MG PO TABS: ORAL_TABLET | ORAL | Status: DC

## 2014-08-13 MED ORDER — DEXAMETHASONE SODIUM PHOSPHATE 4 MG/ML IJ SOLN
4.0000 mg | Freq: Once | INTRAMUSCULAR | Status: AC
  Administered 2014-08-13: 4 mg via INTRAVENOUS
  Filled 2014-08-13: qty 1

## 2014-08-13 NOTE — ED Notes (Signed)
Pt c/o shore throat, body aches, chills, and generalized weakness x 1 wk. States this is his 3rd time time here.

## 2014-08-13 NOTE — ED Provider Notes (Signed)
CSN: [id number]     Arrival date & time 08/13/14  1850 History   First MD Initiated Contact with Patient 08/13/14 1933     Chief Complaint  Patient presents with  . Sore Throat     HPI Pt was seen at 1935.  Per pt, c/o gradual onset and persistence of constant sore throat for the past 1 week. Has been associated with subjective home fevers/chills, as well as generalized body aches/fatigue. Pt has been taking PCN without improvement.  Denies objective fevers, no rash, no CP/SOB, no N/V/D, no abd pain, no drooling/stridor, no hoarse voice, no injury. The symptoms have been associated with no other complaints. The patient has a significant history of similar symptoms previously, recently being evaluated for this complaint and multiple prior evals for same.      Past Medical History  Diagnosis Date  . Chronic back pain   . History of spinal fusion   . History of degenerative disc disease   . Arthritis    Past Surgical History  Procedure Laterality Date  . Spinal fusion    . 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 Smoking status: Current Every Day Smoker  . Smokeless tobacco: Not on file  . Alcohol Use: No    Review of Systems ROS: Statement: All systems negative except as marked or noted in the HPI; Constitutional: Negative for objective fever and +chills. ; ; Eyes: Negative for eye pain, redness and discharge. ; ; ENMT: Negative for ear pain, hoarseness, nasal congestion, sinus pressure and +sore throat. ; ; Cardiovascular: Negative for chest pain, palpitations, diaphoresis, dyspnea and peripheral edema. ; ; Respiratory: Negative for cough, wheezing and stridor. ; ; Gastrointestinal: Negative for nausea, vomiting, diarrhea, abdominal pain, blood in stool, hematemesis, jaundice and rectal bleeding. . ; ; Genitourinary:  Negative for dysuria, flank pain and hematuria. ; ; Musculoskeletal: Negative for back pain and neck pain. Negative for swelling and trauma.; ; Skin: Negative for pruritus, rash, abrasions, blisters, bruising and skin lesion.; ; Neuro: Negative for headache, lightheadedness and neck stiffness. Negative for weakness, altered level of consciousness , altered mental status, extremity weakness, paresthesias, involuntary movement, seizure and syncope.      Allergies  Toradol  Home Medications   Prior to Admission medications   Medication Sig Start Date End Date Taking? Authorizing Provider  acetaminophen (TYLENOL) 325 MG tablet Take 650-975 mg by mouth every 6 (six) hours as needed for mild pain.   Yes Historical Provider, MD  cyclobenzaprine (FLEXERIL) 10 MG tablet TAKE 1 TABLET BY MOUTH THREE TIMES DAILY AS NEEDED FOR MUSCLE SPASMS 07/14/14  Yes Babs SciaraScott A Luking, MD  fluticasone (FLONASE) 50 MCG/ACT nasal spray Place 2 sprays into the nose daily. Patient taking differently: Place 2 sprays into the nose daily as needed for allergies.  01/09/13  Yes Babs SciaraScott A Luking, MD  Ketotifen Fumarate (ALLERGY EYE DROPS OP) Apply 2 drops to eye daily as needed (allergies).   Yes Historical Provider, MD  Oxycodone HCl 10 MG TABS Take 1 tablet (10 mg total) by mouth every 4 (four) hours as needed. 07/14/14  Yes Babs SciaraScott A Luking, MD  penicillin v potassium (VEETID) 500 MG tablet Take 1 tablet (500 mg total) by mouth 4 (four) times daily. 08/06/14 08/13/14 Yes Hope Orlene OchM Neese, NP  Diphenhyd-Hydrocort-Nystatin (FIRST-DUKES MOUTHWASH) SUSP  Use as directed 10 mLs in the mouth or throat every 4 (four) hours as needed (gargle and spit for throat pain relief). Patient not taking: Reported on 08/13/2014 08/09/14   Burgess AmorJulie Idol, PA-C  HYDROcodone-acetaminophen (NORCO/VICODIN) 5-325 MG per tablet Take 1 tablet by mouth every 4 (four) hours as needed. Patient not taking: Reported on 08/13/2014 08/06/14   Janne NapoleonHope M Neese, NP  naproxen  (NAPROSYN) 500 MG tablet Take 1 tablet (500 mg total) by mouth 2 (two) times daily with a meal. Patient not taking: Reported on 08/13/2014 10/16/13   Babs SciaraScott A Luking, MD  oxyCODONE-acetaminophen (PERCOCET/ROXICET) 5-325 MG per tablet Take 1 tablet by mouth every 4 (four) hours as needed. Patient not taking: Reported on 08/13/2014 08/09/14   Burgess AmorJulie Idol, PA-C   BP 106/74 mmHg  Pulse 71  Temp(Src) 98.6 F (37 C) (Oral)  Resp 20  Ht 6\' 1"  (1.854 m)  Wt 180 lb (81.647 kg)  BMI 23.75 kg/m2  SpO2 96% Physical Exam  1940: Physical examination:  Nursing notes reviewed; Vital signs and O2 SAT reviewed;  Constitutional: Well developed, Well nourished, Well hydrated, In no acute distress; Head:  Normocephalic, atraumatic; Eyes: EOMI, PERRL, No scleral icterus; ENMT: TM's clear bilat. +edemetous nasal turbinates bilat with clear rhinorrhea. +posterior pharyngeal erythema. No soft palate bulging. Mouth and pharynx without lesions. No tonsillar exudates. No intra-oral edema. No submandibular or sublingual edema. No hoarse voice, no drooling, no stridor. No pain with manipulation of larynx. No trismus. Mouth and pharynx normal, Mucous membranes moist; Neck: Supple, Full range of motion, No lymphadenopathy. No meningeal signs.; Cardiovascular: Regular rate and rhythm, No murmur, rub, or gallop; Respiratory: Breath sounds clear & equal bilaterally, No rales, rhonchi, wheezes.  Speaking full sentences with ease, Normal respiratory effort/excursion; Chest: Nontender, Movement normal; Abdomen: Soft, Nontender, Nondistended, Normal bowel sounds.; Genitourinary: No CVA tenderness; Extremities: Pulses normal, No tenderness, No edema, No calf edema or asymmetry.; Neuro: AA&Ox3, Major CN grossly intact.  Speech clear. No gross focal motor or sensory deficits in extremities.; Skin: Color normal, Warm, Dry.   ED Course  Procedures     MDM  MDM Reviewed: previous chart, nursing note and vitals Reviewed previous:  labs Interpretation: labs and CT scan   Results for orders placed or performed during the hospital encounter of 08/13/14  Rapid strep screen  Result Value Ref Range   Streptococcus, Group A Screen (Direct) NEGATIVE NEGATIVE  Basic metabolic panel  Result Value Ref Range   Sodium 141 137 - 147 mEq/L   Potassium 4.0 3.7 - 5.3 mEq/L   Chloride 102 96 - 112 mEq/L   CO2 27 19 - 32 mEq/L   Glucose, Bld 80 70 - 99 mg/dL   BUN 13 6 - 23 mg/dL   Creatinine, Ser 9.520.91 0.50 - 1.35 mg/dL   Calcium 9.7 8.4 - 84.110.5 mg/dL   GFR calc non Af Amer >90 >90 mL/min   GFR calc Af Amer >90 >90 mL/min   Anion gap 12 5 - 15  CBC with Differential  Result Value Ref Range   WBC 9.8 4.0 - 10.5 K/uL   RBC 4.98 4.22 - 5.81 MIL/uL   Hemoglobin 15.5 13.0 - 17.0 g/dL   HCT 32.445.7 40.139.0 - 02.752.0 %   MCV 91.8 78.0 - 100.0 fL   MCH 31.1 26.0 - 34.0 pg   MCHC 33.9 30.0 - 36.0 g/dL   RDW 25.313.6 66.411.5 - 40.315.5 %   Platelets 277 150 - 400 K/uL   Neutrophils  Relative % 60 43 - 77 %   Neutro Abs 5.8 1.7 - 7.7 K/uL   Lymphocytes Relative 28 12 - 46 %   Lymphs Abs 2.8 0.7 - 4.0 K/uL   Monocytes Relative 7 3 - 12 %   Monocytes Absolute 0.7 0.1 - 1.0 K/uL   Eosinophils Relative 4 0 - 5 %   Eosinophils Absolute 0.4 0.0 - 0.7 K/uL   Basophils Relative 1 0 - 1 %   Basophils Absolute 0.1 0.0 - 0.1 K/uL  Mononucleosis screen  Result Value Ref Range   Mono Screen NEGATIVE NEGATIVE   Ct Soft Tissue Neck W Contrast 08/13/2014   CLINICAL DATA:  43 year old male with sore throat, body aches and generalized weakness.  EXAM: CT NECK WITH CONTRAST  TECHNIQUE: Multidetector CT imaging of the neck was performed using the standard protocol following the bolus administration of intravenous contrast.  CONTRAST:  75mL OMNIPAQUE IOHEXOL 300 MG/ML  SOLN  COMPARISON:  Prior MRI of the cervical spine 05/18/2010  FINDINGS: The visualized intracranial contents are within normal limits. No evidence of significant soft tissue asymmetry or enhancing mass  in the nasopharynx, oropharynx, supraglottic or laryngeal regions. No abnormal adenopathy. No evidence of abscess or cellulitis. Unremarkable salivary glands.  Normal appearance of the thyroid gland. The visualized vessels are within normal limits. Centrilobular emphysema is noted in both upper lobes. No suspicious pulmonary nodule or mass in the visualized portion of the lungs. The visualized esophagus is unremarkable.  Normal aeration of the bilateral mastoid air cells. No middle ear effusion. The visualized paranasal sinuses are clear. No acute osseous abnormality. 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 There is associated focal loss of the cervical lordosis at this level.  IMPRESSION: 1. No acute abnormality. 2. 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 3. Pulmonary emphysema.   Electronically Signed   By: Malachy Moan M.D.   On: 08/13/2014 21:40    2145:  Workup reassuring. Pt remains afebrile with his VS per his baseline. Will tx symptomatically at this time. Dx and testing d/w pt.  Questions answered.  Verb understanding, agreeable to d/c home with outpt f/u.   Samuel Jester, DO 08/16/14 [phone]

## 2014-08-13 NOTE — Discharge Instructions (Signed)
°Emergency Department Resource Guide °1) Find a Doctor and Pay Out of Pocket °Although you won't have to find out who is covered by your insurance plan, it is a good idea to ask around and get recommendations. You will then need to call the office and see if the doctor you have chosen will accept you as a new patient and what types of options they offer for patients who are self-pay. Some doctors offer discounts or will set up payment plans for their patients who do not have insurance, but you will need to ask so you aren't surprised when you get to your appointment. ° °2) Contact Your Local Health Department °Not all health departments have doctors that can see patients for sick visits, but many do, so it is worth a call to see if yours does. If you don't know where your local health department is, you can check in your phone book. The CDC also has a tool to help you locate your state's health department, and many state websites also have listings of all of their local health departments. ° °3) Find a Walk-in Clinic °If your illness is not likely to be very severe or complicated, you may want to try a walk in clinic. These are popping up all over the country in pharmacies, drugstores, and shopping centers. They're usually staffed by nurse practitioners or physician assistants that have been trained to treat common illnesses and complaints. They're usually fairly quick and inexpensive. However, if you have serious medical issues or chronic medical problems, these are probably not your best option. ° °No Primary Care Doctor: °- Call [facility] at  832-8000 - they can help you locate a primary care doctor that  accepts your insurance, provides certain services, etc. °- Physician Referral Service- 1-800-533-3463 ° °Chronic Pain Problems: °Organization         Address  Phone   Notes  °[facility]  (336) 297-2271 Patients need to be referred by their primary care doctor.  ° °Medication  Assistance: °Organization         Address  Phone   Notes  °[place] [facility] [address] °[place], [place] [address] (336) 641-8030 --Must be a resident of [place] °-- Must have NO insurance coverage whatsoever (no Medicaid/ Medicare, etc.) °-- The pt. MUST have a primary care doctor that directs their care regularly and follows them in the community °  °[facility]  (866) 331-1348   °[employer]  (888) 892-1162   ° °Agencies that provide inexpensive medical care: °Organization         Address  Phone   Notes  °[facility]  (336) 832-8035   °[facility]    (336) 832-7272   °[facility] [address] °[place], [place] [address] (336) 832-4777   °[facility] [address], °[place] (336) 271-4999   °[facility]    (336) 373-0678   °[facility]    (336) 272-1050   °[facility] ° [address], [place] Phone:  (336) 832-4444, Fax:  (336) 832-4440 Hours of Operation:  9 am - 6 pm, M-F.  Also accepts Medicaid/Medicare and self-pay.  °[facility] ° [address], [place] Phone: (336) 832-3150, Fax: (336) [phone]. Hours of Operation:  8:30 am - 5:30 pm, M-F.  Also accepts Medicaid and self-pay.  °[facility] [place] [address], [place] Phone: (336) 878-6027   °[facility] [address], [place], [place] (336)723-1848, Ext. 123 Mondays & Thursdays: 7-9 AM.  First 15 patients are seen on a first come, first serve basis. °  ° °Medicaid-accepting [place] Providers: ° °Organization         Address  Phone   Notes  °[facility] [address], [place] (336) 641-2100 Also accepts self-pay patients.  °[facility] [address], [place] ° (336) 856-9996   °[facility] [address], [place]  (336) 288-8857   °[facility] [address] [place] [address], [place] (336) 299-7000   °Veita Bland [address], [place]  ° (336) 373-1557 Only accepts [place] Access Medicaid patients after they have their name applied to their card.  ° °Self-Pay (no insurance) in [place]: ° °Organization         Address  Phone   Notes  °Sickle Cell Patients, [facility] [address], [place] (336) 832-1970   °[facility] [address], [place] (336) 832-4400   °[facility] ° [address], [place] (336) 992-4800   °[facility]/Dr. Osei-Bonsu ° [address] [place] [address], [place] or [address], [place] (336) 841-8500 Phone number for both [place] and [place] locations is the same.  °[facility] [address], [place] (336) 299-0000   °[facility] [address] [place] [address], [place] or [address] (336) 852-7530 °(336) 878-2260   °[facility] [address], [place] (336) 350-1642, phone; (336) 294-5005, fax Sees patients 1st and 3rd Saturday of every month.  Must not qualify for public or private insurance (i.e. Medicaid, Medicare, [place] Health Choice, Veterans' Benefits) • Household income should be no more than 200% of the poverty level •The clinic cannot treat you if you are pregnant or think you are pregnant • Sexually transmitted diseases are not treated at the clinic.  ° ° °Dental Care: °Organization         Address  Phone  Notes  °[place] [facility] [address], [place] (336) 641-6152 Accepts children up to age 21 who are enrolled in Medicaid or [place] Health Choice; pregnant women with a Medicaid card; and children who have applied for Medicaid or [place] Health Choice, but were declined, whose parents can pay a reduced fee at time of service.  °[place]  [facility] [place]  [address], [place] (336) 641-7733 Accepts children up to age 21 who are enrolled in Medicaid or [place] Health Choice; pregnant women with a Medicaid card; and children who have applied for Medicaid or [place] Health Choice, but were declined, whose parents can pay a reduced fee at time of service.  °[facility] ° [address], [place] (336) 641-4533 Patients are seen by appointment only. Walk-ins are not accepted. [facility] will see patients 18 years of age and older. °Monday - Tuesday (8am-5pm) °Most Wednesdays (8:30-5pm) °$30 per visit, cash only  °[facility] ° [address], [place] (336) 641-4533 Patients are seen by appointment only. Walk-ins are not accepted. [facility] will see patients 18 years of age and older. °One   Wednesday Evening (Monthly: Volunteer Based).  $30 per visit, cash only  °[facility]  (919) 537-3737 for adults; Children under age 4, call Graduate Pediatric Dentistry at ([phone]) [phone]. Children aged 4-14, please call (919) [phone] to request a pediatric application. ° Dental services are provided in all areas of dental care including fillings, crowns and bridges, complete and partial dentures, implants, gum treatment, root canals, and extractions. Preventive care is also provided. Treatment is provided to both adults and children. °Patients are selected via a lottery and there is often a waiting list. °  °[facility] [address], °[place] ° (336) 763-8833 [email or web address] °  °[facility] [address], [place], [place] (336)723-1848, Ext. 123 Second and Fourth Thursday of each month, opens at 6:30 AM; Clinic ends at 9 AM.  Patients are seen on a first-come first-served basis, and a limited number are seen during each clinic.  ° °[facility] ° [address], [place], [place] (336) 723-7904    Eligibility Requirements °You must have lived in [place], [place], or [place] counties for at least the last three months. °  You cannot be eligible for state or federal sponsored healthcare insurance, including Veterans Administration, Medicaid, or Medicare. °  You generally cannot be eligible for healthcare insurance through your employer.  °  How to apply: °Eligibility screenings are held every Tuesday and Wednesday afternoon from 1:00 pm until 4:00 pm. You do not need an appointment for the interview!  °[facility] [address], [place], [place] 336-631-2330   °[facility]  336-342-8273   °[facility]  336-703-3100   °[facility]  336-570-6415   ° °Behavioral Health Resources in the Community: °Intensive Outpatient Programs °Organization         Address  Phone  Notes  °[facility] [address], [place], [place] 336-878-6098   °[facility] [address], [place], [place] 336-832-9800   °[facility] [address], [place], [place] ° 336-882-2125   °[facility] [address],  °[place], [place] 1-800-853-5163 or 336-641-4981   °Substance Abuse Resources °Organization         Address  Phone  Notes  °[facility]  336-882-2125   °[facility]  336-784-9470   °[facility]  336-285-9073   °[facility]  336-845-3988   °Residential & Outpatient Substance Abuse Program  1-800-659-3381   °Psychological Services °Organization         Address  Phone  Notes  °[facility]  336- 832-9600   °[facility]  336- 378-7881   °[facility] [address], [place] 1-800-853-5163 or 336-641-4981   ° °Mobile Crisis Teams °Organization         Address  Phone  Notes  °[facility], Mobile Crisis Care Unit  1-877-626-1772   °Assertive °[facility] ° [address]  [place], [place] 336-834-9664   °Sharon DeEsch [address] °[place] 336-554-5454   ° °Self-Help/Support Groups °Organization         Address  Phone             Notes  °[facility]  - variety of support groups  336- 373-1402 Call for more information  °Narcotics Anonymous (NA), [facility] [address], °[place]  2 meetings at this location  ° °  Residential Treatment Programs Organization         Address  Phone  Notes  [facility] [address],    [place]  [phone]   [facility]  [address], [place] [phone], [place], [place] [phone]   [facility] [address], [place] [phone]) [phone] Admissions: 8am-3pm M-F  [facility] [address]    [place], [place] [phone]   [facility] [address], [place], [place] [phone]) [phone]   [facility] [address]  [place], [place] [phone]   [facility] Intensive Outpatient [address], [place], [place] [phone]   [facility] ([facility] [address]  [place], [place] [phone]-(407)[phone] or [phone]   [facility] [address] [place], [place] [phone] Accepts Medicaid  [facility] [address]  [place] [phone] Substance Abuse/Addiction Treatment   [facility]         Address  Phone  Notes  [facility]  732-868-3115(888) (305)[phone]   Angie FavaJulie Brannon, PhD [address], [address] [place], [place]   [phone](336) [phone] or [phone](336) [phone]   [facility]   [address] [place], [place] [phone](336) [phone]   [facility] [address], [place], [place] [phone](336) [phone] Insurance/Medicaid/sponsorship through [facility] [address]                                    [place], [place] [phone](336) [phone] Therapy/tele-psych/case    [facility] [address]   [place], [place] [phone](336) [phone]    Dr. Lolly MustacheArfeen  [phone](336) [phone]   [facility]  [employer] [facility] 1) [address], [place] 2) [address], [place] 3)  [address], [place] [phone](336) ([phone]) [phone](336) [phone]  [phone](336) [phone]   [facility] Hot[facility] 306-884-5228(336) 417-678-9939 or (267)[phone](336) [phone] (After Hours)      Take over the counter tylenol and ibuprofen, as directed on packaging, as needed for discomfort. Do not take extra tylenol if you are taking the prescription pain medication because it already has tylenol in it.  Gargle with warm water several times per day to help with discomfort.  May also use over the counter sore throat pain medicines such as chloraseptic or sucrets, as directed on packaging, as needed for discomfort.  Call your regular medical doctor on Monday to schedule a follow up appointment in the next 3 days.  Return to the Emergency Department immediately if worsening.

## 2014-08-16 LAB — CULTURE, GROUP A STREP

## 2014-10-18 ENCOUNTER — Encounter: Payer: Self-pay | Admitting: Family Medicine

## 2014-10-18 ENCOUNTER — Ambulatory Visit (INDEPENDENT_AMBULATORY_CARE_PROVIDER_SITE_OTHER): Payer: Medicare Other | Admitting: Family Medicine

## 2014-10-18 VITALS — BP 122/88 | Ht 74.0 in | Wt 172.4 lb

## 2014-10-18 DIAGNOSIS — M545 Low back pain, unspecified: Secondary | ICD-10-CM

## 2014-10-18 DIAGNOSIS — G8929 Other chronic pain: Secondary | ICD-10-CM | POA: Diagnosis not present

## 2014-10-18 DIAGNOSIS — M7711 Lateral epicondylitis, right elbow: Secondary | ICD-10-CM | POA: Diagnosis not present

## 2014-10-18 MED ORDER — OXYCODONE HCL 10 MG PO TABS: 10.0000 mg | ORAL_TABLET | ORAL | Status: DC | PRN

## 2014-10-18 NOTE — Patient Instructions (Signed)

## 2014-10-18 NOTE — Progress Notes (Signed)
   Subjective:    Patient ID: [name], male    DOB: 1971/07/09, 44 y.o.   MRN: [id number]  HPI This patient was seen today for chronic pain  The medication list was reviewed and updated.   -Compliance with pain medication: yes  The patient was advised the importance of maintaining medication and not using illegal substances with these.  Refills needed: yes  The patient was educated that we can provide 3 monthly scripts for their medication, it is their responsibility to follow the instructions.  Side effects or complications from medications: none  Patient is aware that pain medications are meant to minimize the severity of the pain to allow their pain levels to improve to allow for better function. They are aware of that pain medications cannot totally remove their pain.  Due for UDT ( at least once per year) : next  visit   Patient states that he is having right elbow pain that started about 2 1/2 weeks ago when he was moving a couch.  He relates he's tried anti-inflammatories icing without any help it has, become progressive  Review of Systems Denies high fever chills sweats nausea vomiting diarrhea    Objective:   Physical Exam  Lungs are clear hearts regular pulse normal low back subjective discomfort right elbow moderate pain discomfort to palpation      Assessment & Plan:  Chronic back pain-stretching exercises, anti-inflammatories when necessary, prescriptions for pain medicine given  Lateral epicondylitis right elbow-injection given without difficulty. Care was taken to carefully inject around the tendon and not in the tendon. Half cc Depo-Medrol half cc lidocaine without epinephrine, consent verbal was obtained. Patient was told that this should significantly help with the pain and discomfort. He was also given the option of continuing anti-inflammatories and icing as the sole treatment but he did not want to go that route.

## 2014-11-12 ENCOUNTER — Encounter: Payer: Self-pay | Admitting: Family Medicine

## 2014-11-12 ENCOUNTER — Ambulatory Visit (INDEPENDENT_AMBULATORY_CARE_PROVIDER_SITE_OTHER): Payer: Medicare Other | Admitting: Family Medicine

## 2014-11-12 VITALS — BP 108/70 | Temp 99.0°F | Ht 74.0 in | Wt 168.0 lb

## 2014-11-12 DIAGNOSIS — H65192 Other acute nonsuppurative otitis media, left ear: Secondary | ICD-10-CM

## 2014-11-12 MED ORDER — LEVOFLOXACIN 500 MG PO TABS: 500.0000 mg | ORAL_TABLET | Freq: Every day | ORAL | Status: DC

## 2014-11-12 NOTE — Progress Notes (Signed)
   Subjective:    Patient ID: [name], male    DOB: [birth year], 44 y.o.   MRN: [id number]  Otalgia  There is pain in the left ear. This is a new problem. Episode onset: 2.5 weeks ago. Associated symptoms include coughing, headaches and rhinorrhea. Associated symptoms comments: fever. Treatments tried: warm water and alcholol.   Patient concerned about the possibility of otitis externa. No known risk factors   Review of Systems  Constitutional: Negative for fever and activity change.  HENT: Positive for congestion, ear pain and rhinorrhea.   Eyes: Negative for discharge.  Respiratory: Positive for cough. Negative for wheezing.   Cardiovascular: Negative for chest pain.  Neurological: Positive for headaches.       Objective:   Physical Exam  Constitutional: He appears well-developed.  HENT:  Head: Normocephalic.  Mouth/Throat: Oropharynx is clear and moist. No oropharyngeal exudate.  Neck: Normal range of motion.  Cardiovascular: Normal rate, regular rhythm and normal heart sounds.   No murmur heard. Pulmonary/Chest: Effort normal and breath sounds normal. He has no wheezes.  Lymphadenopathy:    He has no cervical adenopathy.  Neurological: He exhibits normal muscle tone.  Skin: Skin is warm and dry.  Nursing note and vitals reviewed.   Left eardrum red with some fluid    Assessment & Plan:  Viral syndrome Early left otitis media Antibiotics prescribed May use decongestants for a few days If not improving over the course of the next several days call back If persistent over the next 2 weeks I recommend we refer him to ENT he will call [name]

## 2014-11-12 NOTE — Patient Instructions (Signed)
Short acting decongeatant ( OTC ) no more than 2 times a day for the next week  If ringing doesn't go away over the next 2 weeks to let us know

## 2015-01-14 ENCOUNTER — Ambulatory Visit: Payer: Medicare Other | Admitting: Family Medicine

## 2015-01-14 ENCOUNTER — Telehealth: Payer: Self-pay | Admitting: Family Medicine

## 2015-01-14 MED ORDER — CYCLOBENZAPRINE HCL 10 MG PO TABS: ORAL_TABLET | ORAL | Status: DC

## 2015-01-14 MED ORDER — OXYCODONE HCL 10 MG PO TABS: 10.0000 mg | ORAL_TABLET | ORAL | Status: DC | PRN

## 2015-01-14 NOTE — Telephone Encounter (Signed)
One mo worth both

## 2015-01-14 NOTE — Telephone Encounter (Signed)
Pt showed up for his med check today unaware that a letter was sent Out to reschedule. Pt is needing a refill on his hydrocodone and his muscle Relaxer. Pt has an appt scheduled for tues at 9am.

## 2015-01-14 NOTE — Telephone Encounter (Signed)
Notified patient that script for pain med will be ready after lunch for pickup and flexeril was sent to pharmacy.

## 2015-01-18 ENCOUNTER — Ambulatory Visit: Payer: Medicare Other | Admitting: Family Medicine

## 2015-02-07 ENCOUNTER — Ambulatory Visit (INDEPENDENT_AMBULATORY_CARE_PROVIDER_SITE_OTHER): Payer: Medicare Other | Admitting: Family Medicine

## 2015-02-07 ENCOUNTER — Encounter: Payer: Self-pay | Admitting: Family Medicine

## 2015-02-07 VITALS — BP 118/72 | Ht 74.0 in | Wt 168.0 lb

## 2015-02-07 DIAGNOSIS — G8929 Other chronic pain: Secondary | ICD-10-CM

## 2015-02-07 DIAGNOSIS — Z79899 Other long term (current) drug therapy: Secondary | ICD-10-CM | POA: Diagnosis not present

## 2015-02-07 DIAGNOSIS — M545 Low back pain, unspecified: Secondary | ICD-10-CM

## 2015-02-07 DIAGNOSIS — E785 Hyperlipidemia, unspecified: Secondary | ICD-10-CM

## 2015-02-07 DIAGNOSIS — M7711 Lateral epicondylitis, right elbow: Secondary | ICD-10-CM | POA: Diagnosis not present

## 2015-02-07 MED ORDER — OXYCODONE HCL 10 MG PO TABS: 10.0000 mg | ORAL_TABLET | ORAL | Status: DC | PRN

## 2015-02-07 NOTE — Patient Instructions (Signed)

## 2015-02-07 NOTE — Progress Notes (Signed)
   Subjective:    Patient ID: [name], male    DOB: 1971-09-07, 44 y.o.   MRN: [id number]  HPI This patient was seen today for chronic pain  The medication list was reviewed and updated.   -Compliance with pain medication: yes  The patient was advised the importance of maintaining medication and not using illegal substances with these.  Refills needed: yes  The patient was educated that we can provide 3 monthly scripts for their medication, it is their responsibility to follow the instructions.  Side effects or complications from medications: no  Patient is aware that pain medications are meant to minimize the severity of the pain to allow their pain levels to improve to allow for better function. They are aware of that pain medications cannot totally remove their pain.  Due for UDT ( at least once per year) : today    The patient states that the pain medication does help with keeping his pain under control where he can function.  Patient does relate he has tried watch his diet to keep his cholesterol down trying to stay active to some degree  Patient also relates right elbow pain discomfort hurts with movement. States the first shot helped but now that he is having reoccurrence he would like to do an another shot. He would like to put off orthopedic referral for this present moment. Patient does smoke. He's been counseled to quit.    Review of Systems  Constitutional: Negative for activity change.  Gastrointestinal: Negative for vomiting and abdominal pain.  Neurological: Negative for weakness.  Psychiatric/Behavioral: Negative for confusion.       Objective:   Physical Exam  Constitutional: He appears well-nourished.  Cardiovascular: Normal rate, regular rhythm and normal heart sounds.   No murmur heard. Pulmonary/Chest: Effort normal and breath sounds normal.  Musculoskeletal: He exhibits no edema.  Lymphadenopathy:    He has no cervical adenopathy.    Neurological: He is alert.  Psychiatric: His behavior is normal.  Vitals reviewed.         Assessment & Plan:  #1 lateral epicondylitis and right elbow we went ahead and did an injection of 0.6 mL of Depo-Medrol as well as 1 mL of lidocaine. This was done in sterile fashion after receiving verbal consent from the patient if the patient has ongoing troubles with this next step is referral to orthopedics  #2 chronic pain control patient was given prescription for his pain medication. He is due to use us as directed. He denies abusing it. One prescription was given today. Additional prescriptions will be given based upon the results of the urine drug test  #3 patient is due for lipid panel due to hyperlipidemia also because of his medications we do need to check kidney function.

## 2015-02-11 LAB — TOXASSURE SELECT 13 (MW), URINE: PDF: 0

## 2015-02-24 ENCOUNTER — Ambulatory Visit (INDEPENDENT_AMBULATORY_CARE_PROVIDER_SITE_OTHER): Payer: Medicare Other | Admitting: Family Medicine

## 2015-02-24 ENCOUNTER — Encounter: Payer: Self-pay | Admitting: Family Medicine

## 2015-02-24 VITALS — BP 100/78 | Ht 74.0 in | Wt 168.5 lb

## 2015-02-24 DIAGNOSIS — G8929 Other chronic pain: Secondary | ICD-10-CM | POA: Diagnosis not present

## 2015-02-24 DIAGNOSIS — M545 Low back pain: Secondary | ICD-10-CM | POA: Diagnosis not present

## 2015-02-24 MED ORDER — TRAZODONE HCL 50 MG PO TABS: 25.0000 mg | ORAL_TABLET | Freq: Every evening | ORAL | Status: DC | PRN

## 2015-02-24 NOTE — Progress Notes (Signed)
   Subjective:    Patient ID: [name], male    DOB: 1971/09/14, 44 y.o.   MRN: [id number]  HPI Patient is here today to discuss urine drug test results. Patient has no other concerns at this time.   patient's urine drug screen showed hydrocodone but did not show oxycodone. It also showed Xanax in the urine. Patient admits to taking a Xanax that his friend gave him to help him sleep. He denies using it otherwise.  Patient denies using hydrocodone states he's always been using the oxycodone. He denies abusing it. He cannot explain urine drug findings.  Review of Systems     Objective:   Physical Exam   15 minutes was spent with the patient discussing pain management discussing his urine drug screen.  This patient was seen today for chronic pain  The medication list was reviewed and updated.   -Compliance with pain medication: yes  The patient was advised the importance of maintaining medication and not using illegal substances with these.  Refills needed: yes  The patient was educated that we can provide 3 monthly scripts for their medication, it is their responsibility to follow the instructions.  Side effects or complications from medications: GI upset  Patient is aware that pain medications are meant to minimize the severity of the pain to allow their pain levels to improve to allow for better function. They are aware of that pain medications cannot totally remove their pain.  Due for UDT ( at least once per year) : yes     Assessment & Plan:   the patient was informed at spurious result important take his medicine and no one else's medicines.   patient was also informed that the use of nerve pills with pain medication greatly increases risk of death    patient was told to use trazodone to help with sleep at night    the patient will be brought back in to the office in July for a urine drug screen and pill count as part of a office visit. The patient was given 1  additional prescription that he can get filled his final prescription is being held at the nurses station  It should also be noted that his urine showed hydrocodone when he is being prescribed Doxy. I question him directly on this and he denies using oxycodone. If this happens again we will not be able to prescribe narcotic medications ongoing

## 2015-03-30 ENCOUNTER — Telehealth: Payer: Self-pay | Admitting: Family Medicine
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
## 2015-03-31 MED ORDER — OXYCODONE HCL 10 MG PO TABS: 10.0000 mg | ORAL_TABLET | ORAL | Status: DC | PRN
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
## 2015-03-31 NOTE — Telephone Encounter (Signed)
Per Dr Lorin PicketScott:  (please write down back on Monday, July 18 it will be important to call [name] to have him come in preferably Tuesday for a urine drug screen and a pill count. This is per the last urine drug screen showing Xanax when he is not being prescribed that. Dr Lorin PicketScott spoke with the patient about this about a month ago. It is normal part of our protocol to do a follow-up on how he is taking his medicines and this is required by the laws we have to follow)

## 2015-03-31 NOTE — Telephone Encounter (Signed)
Appt cancelled for 04/05/15

## 2015-04-01 NOTE — Telephone Encounter (Signed)
Per Dr. Lorin PicketScott (Nurse's Only)- Call patient on 04/11/15 in the afternoon and inform patient that he MUST come in on 04/12/15 for urine drug screen and pill count. Patient has to come in per Dr. Lorin PicketScott on this date with medication.

## 2015-04-04 NOTE — Telephone Encounter (Signed)
Patient added to drug testing recall list to be contacted 04/11/15 for drug test and pill count 04/12/15.

## 2015-04-05 ENCOUNTER — Ambulatory Visit: Payer: Medicare Other | Admitting: Family Medicine

## 2015-04-11 ENCOUNTER — Telehealth: Payer: Self-pay | Admitting: Family Medicine

## 2015-04-11 NOTE — Telephone Encounter (Signed)
Pt will need a refill on his oxycodone in a couple of days. Pt was told to call a couple days before he is out.

## 2015-04-11 NOTE — Telephone Encounter (Signed)
Discussed with pt that he needs ov for pill count and urine drug screen tomorrow. Pt scheduled appt

## 2015-04-11 NOTE — Telephone Encounter (Signed)
[facility] on home number. Mobile number no longer in service.

## 2015-04-12 ENCOUNTER — Encounter: Payer: Self-pay | Admitting: Family Medicine

## 2015-04-12 ENCOUNTER — Ambulatory Visit (INDEPENDENT_AMBULATORY_CARE_PROVIDER_SITE_OTHER): Payer: Medicare Other | Admitting: Family Medicine

## 2015-04-12 VITALS — BP 122/80 | Ht 74.0 in | Wt 168.4 lb

## 2015-04-12 DIAGNOSIS — M542 Cervicalgia: Secondary | ICD-10-CM | POA: Diagnosis not present

## 2015-04-12 DIAGNOSIS — Z79891 Long term (current) use of opiate analgesic: Secondary | ICD-10-CM

## 2015-04-12 MED ORDER — OXYCODONE HCL 10 MG PO TABS: 10.0000 mg | ORAL_TABLET | ORAL | Status: DC | PRN

## 2015-04-12 NOTE — Progress Notes (Signed)
   Subjective:    Patient ID: [name], male    DOB: 1971-05-21, 44 y.o.   MRN: [id number]  HPI Patient is here today for repeat urine drug screen and pill count. Patient brought an empty pill bottle and needs refill.  No other concerns at this time. Patient states he ran out of medicine 2 days ago because the increased pain Review of Systems  Constitutional: Negative for activity change.  Gastrointestinal: Negative for vomiting and abdominal pain.  Neurological: Negative for weakness.  Psychiatric/Behavioral: Negative for confusion.       Objective:   Physical Exam  Constitutional: He appears well-nourished.  Cardiovascular: Normal rate, regular rhythm and normal heart sounds.   No murmur heard. Pulmonary/Chest: Effort normal and breath sounds normal.  Musculoskeletal: He exhibits no edema.  Lymphadenopathy:    He has no cervical adenopathy.  Neurological: He is alert.  Psychiatric: His behavior is normal.  Vitals reviewed.   Subjective discomfort on the right side of his neck his head is tilted to the right side      Assessment & Plan:  Patient refuses referral to specialist at this point  New prescription given for his medicine 180 tablets  Urine drug screen obtained today await results

## 2015-04-20 LAB — TOXASSURE SELECT 13 (MW), URINE: PDF: 0

## 2015-05-09 ENCOUNTER — Ambulatory Visit: Payer: Medicare Other | Admitting: Family Medicine

## 2015-05-10 ENCOUNTER — Telehealth: Payer: Self-pay | Admitting: Family Medicine

## 2015-05-10 ENCOUNTER — Other Ambulatory Visit: Payer: Self-pay | Admitting: *Deleted

## 2015-05-10 MED ORDER — OXYCODONE HCL 10 MG PO TABS: 10.0000 mg | ORAL_TABLET | ORAL | Status: DC | PRN

## 2015-05-10 NOTE — Telephone Encounter (Signed)
Script ready for pickup. Pt notified script ready and to bring pill bottle with him at office visit. Pt transferred to front to reschedule his appt for the week of labor day.

## 2015-05-10 NOTE — Telephone Encounter (Signed)
Nurse's-please confirm the date when he is to get his next refill. He may have a refill for that date. The patient is also to bring his pills with him for a pill count. (UDT to be done on the day that he comes in. This is not to be told to the patient.) I request that his appointment be moved from 8/29 to the week of Labor Day.

## 2015-05-10 NOTE — Telephone Encounter (Signed)
Last filled on 7/28. Next due 8/27.

## 2015-05-10 NOTE — Telephone Encounter (Signed)
Patient missed an appointment yesterday for his pain medication check up.  He has rescheduled to 05/16/2015, but will be out of his Oxycodone HCl 10 MG TABS before then.  He wants to know if we can give him enough to last until 05/16/2015?

## 2015-05-16 ENCOUNTER — Ambulatory Visit: Payer: Medicare Other | Admitting: Family Medicine

## 2015-05-26 ENCOUNTER — Ambulatory Visit: Payer: Medicare Other | Admitting: Family Medicine

## 2015-06-27 ENCOUNTER — Telehealth: Payer: Self-pay | Admitting: Family Medicine

## 2015-06-27 MED ORDER — OXYCODONE HCL 10 MG PO TABS: 10.0000 mg | ORAL_TABLET | ORAL | Status: DC | PRN

## 2015-06-27 NOTE — Telephone Encounter (Signed)
Patient wanting appointment for medcheck next week and he has missed the last two explained that we dont have any appts for this month for medchecks the first available was 11/2. He states out of pain medication and has been using old medication.Please review message from 8/23. Also explained to him the no show policy that he can be discharged for so many no show if he doesn't call to cancel within 24 hrs of appointment.He can be reached at [phone].

## 2015-06-27 NOTE — Telephone Encounter (Signed)
Notified patient may have one refill, schedule office visit for next week, patient to bring medications with him to the office visit. Patient verbalized understanding. Patient will schedule appointment when he comes pickup script.

## 2015-06-27 NOTE — Telephone Encounter (Signed)
Patient may have one refill, schedule office visit for next week, patient to bring medications with him to the office visit.

## 2015-07-04 ENCOUNTER — Encounter: Payer: Self-pay | Admitting: Family Medicine

## 2015-07-04 ENCOUNTER — Ambulatory Visit (HOSPITAL_COMMUNITY)
Admission: RE | Admit: 2015-07-04 | Discharge: 2015-07-04 | Disposition: A | Discharge status: Home / Self Care | Payer: Medicare Other | Source: Ambulatory Visit | Attending: Family Medicine | Admitting: Family Medicine

## 2015-07-04 ENCOUNTER — Ambulatory Visit (INDEPENDENT_AMBULATORY_CARE_PROVIDER_SITE_OTHER): Payer: Medicare Other | Admitting: Family Medicine

## 2015-07-04 VITALS — BP 122/80 | Ht 74.0 in | Wt 176.2 lb

## 2015-07-04 DIAGNOSIS — Z79891 Long term (current) use of opiate analgesic: Secondary | ICD-10-CM | POA: Diagnosis not present

## 2015-07-04 DIAGNOSIS — M47814 Spondylosis without myelopathy or radiculopathy, thoracic region: Secondary | ICD-10-CM | POA: Diagnosis not present

## 2015-07-04 DIAGNOSIS — G8929 Other chronic pain: Secondary | ICD-10-CM | POA: Diagnosis not present

## 2015-07-04 DIAGNOSIS — M546 Pain in thoracic spine: Secondary | ICD-10-CM

## 2015-07-04 DIAGNOSIS — M545 Low back pain: Secondary | ICD-10-CM

## 2015-07-04 DIAGNOSIS — M479 Spondylosis, unspecified: Secondary | ICD-10-CM | POA: Diagnosis not present

## 2015-07-04 DIAGNOSIS — M549 Dorsalgia, unspecified: Secondary | ICD-10-CM | POA: Diagnosis present

## 2015-07-04 NOTE — Progress Notes (Addendum)
Subjective:    Patient ID: [name], male    DOB: 1971-05-19, 44 y.o.   MRN: [id number]  HPI This patient was seen today for chronic pain  The medication list was reviewed and updated.   -Compliance with medication: yes  - Number patient states they take daily: 5-7 daily  -when was the last dose patient took: This morning     The patient was advised the importance of maintaining medication and not using illegal substances with these.  Refills needed: yes  The patient was educated that we can provide 3 monthly scripts for their medication, it is their responsibility to follow the instructions.  Side effects or complications from medications: none  Patient is aware that pain medications are meant to minimize the severity of the pain to allow their pain levels to improve to allow for better function. They are aware of that pain medications cannot totally remove their pain.  Due for UDT ( at least once per year) : UTD  The patient comes in today brings his pills per pill count. He had his pills filled on October 10 for 180 tablets. The patient states that he put 7 days worth of medicines in a container for the coming week but accidentally washed in the washing machine. Therefore he states he is short on his medicine. He states pain medicine does help him he denies abusing it.  He does state he is having severe thoracic pain despite taking his medicine he keeps him awake at night. Radiates into the sides been going on for 6 months he is try to put up with it during this time he does have a history of herniated disc. He has a history of a lumbar back surgery with Dr. Wynetta Emery.  Patient states that the trazodone is not helping with his sleep.     Review of Systems  Constitutional: Negative for activity change, appetite change and fatigue.  HENT: Negative for congestion.   Respiratory: Negative for cough.   Cardiovascular: Negative for chest pain.  Gastrointestinal: Negative for  abdominal pain.  Endocrine: Negative for polydipsia and polyphagia.  Neurological: Negative for weakness.  Psychiatric/Behavioral: Negative for confusion.       Objective:   Physical Exam  Constitutional: He appears well-nourished. No distress.  Cardiovascular: Normal rate, regular rhythm and normal heart sounds.   No murmur heard. Pulmonary/Chest: Effort normal and breath sounds normal. No respiratory distress.  Musculoskeletal: He exhibits no edema.  Lymphadenopathy:    He has no cervical adenopathy.  Neurological: He is alert.  Psychiatric: His behavior is normal.  Vitals reviewed.  The pill count was done in front of the patient. There is 12.5 days worth of medicine present. Even with 7 days being lost in the washing machine there should be 16 daysleft at this point. The patient does not have any explanation for this. He states that he will pay attention closer. I told him in the future will not be able to prescribed earlier no matter what reason       Assessment & Plan:  MRI thoracic spine-due to severe thoracic pain over the past 6 months despite being on 6 oxycodone times a day.  We will go ahead and do plain x-rays of thoracic spine today We will be setting up MRI of the thoracic spine to look to [facility] a herniated disc. Urine drug screen today. If comes back acceptable then will write prescription to be able to be filled on October 29 Patient states  that he lost his medicine please see notations above we will refill early this 1 timethen bring patient back for a pill count and office visit in 4 weeks from now. Patient was told that if he should ever lose his medicines have them stolen or any other such issue we would not be replacing them whatsoever. Patient was warned not to take pain medication with any type of illicit drugs. He was also warned not to take it without call. Also warned that if drowsiness or other side effects with medication to immediately let us know and not  operate any dangerous equipment with pain medicine.  25 minutes was spent with the patient. Greater than half the time was spent in discussion and answering questions and counseling regarding the issues that the patient came in for today.  In regards to the patient's sleep I'm not been put him on any type of sleeping pill trazodone will have to do the best it can

## 2015-07-09 LAB — TOXASSURE SELECT 13 (MW), URINE: PDF: 0

## 2015-07-13 ENCOUNTER — Telehealth: Payer: Self-pay | Admitting: Family Medicine

## 2015-07-13 DIAGNOSIS — M549 Dorsalgia, unspecified: Secondary | ICD-10-CM

## 2015-07-13 NOTE — Telephone Encounter (Signed)
Please write prescription for his pain medication #180 may fill on 07/16/2015, I will sign the prescription notify patient to come pick this up.

## 2015-07-13 NOTE — Telephone Encounter (Signed)
The patient's pain treatment has become more complex than what this office can follow. We will continue to be his primary care doctor. I am recommending for this patient to be followed by pain management. #1-go ahead with pain management referral #2 please talk with the patient if the patient would like to come and sit down and discuss this they can but I believe this step is the correct step to take for him to get proper care of his chronic pain but also habit properly monitored. #3 when the patient is nearing the end of his 30 day prescription I will re-issue an additional prescription. #4 we will not be able to prescribe his pain medication ongoing. once the patient is under the care of pain management we will no longer be prescribing the patient's pain medicine.

## 2015-07-14 ENCOUNTER — Other Ambulatory Visit: Payer: Self-pay | Admitting: Family Medicine

## 2015-07-14 ENCOUNTER — Telehealth: Payer: Self-pay | Admitting: Family Medicine

## 2015-07-14 MED ORDER — OXYCODONE HCL 10 MG PO TABS: ORAL_TABLET | ORAL | Status: DC

## 2015-07-14 NOTE — Telephone Encounter (Signed)
Left message to return call 

## 2015-07-14 NOTE — Telephone Encounter (Signed)
Ignore previous note, clarification to follow

## 2015-07-14 NOTE — Telephone Encounter (Signed)
Patient just got rx for #180 on 06/27/15
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
## 2015-07-14 NOTE — Telephone Encounter (Signed)
Upon review of medical records. Patient had prescription replaced for him back in July. It is our policy not to be replacing medications more than once when they are stolen, lost, destroyed such as washing in a washing machine. Trying to be sympathetic to the patient we will give a partial prescription. The patient may have a prescription for 24 oxycodone 10 mg, 1 every 6 hours when necessary, this will have to last tone until his next prescription which would be November 9. Thank you

## 2015-07-15 NOTE — Addendum Note (Signed)
Addended by: Margaretha SheffieldBROWN, Malia Corsi S on: 07/15/2015 03:26 PM   Modules accepted: Orders

## 2015-07-15 NOTE — Telephone Encounter (Signed)
Discussed with patient. Patient advised Upon review of medical records. Patient had prescription replaced for him back in July. It is our policy not to be replacing medications more than once when they are stolen, lost, destroyed such as washing in a washing machine. Trying to be sympathetic to the patient we will give a partial prescription. The patient may have a prescription for 24 oxycodone 10 mg, 1 every 6 hours when necessary, this will have to last tone until his next prescription which would be November 9. Patient verbalized understanding. Rx up front for patient pick up.

## 2015-07-15 NOTE — Telephone Encounter (Signed)
Discussed with patient. Patient advised The patient's pain treatment has become more complex than what this office can follow. We will continue to be his primary care doctor. Dr Lorin PicketScott  is recommending for this patient to be followed by pain management. #1-go ahead with pain management referral #2 please talk with the patient if the patient would like to come and sit down and discuss this they can but I believe this step is the correct step to take for him to get proper care of his chronic pain but also habit properly monitored. #3 when the patient is nearing the end of his 30 day prescription I will re-issue an additional prescription. #4 we will not be able to prescribe his pain medication ongoing. once the patient is under the care of pain management we will no longer be prescribing the patient's pain medicine. Patient verbalized understanding. Referral to pain management ordered in [facility].

## 2015-07-18 ENCOUNTER — Ambulatory Visit (HOSPITAL_COMMUNITY): Admission: RE | Admit: 2015-07-18 | Payer: Medicare Other | Source: Ambulatory Visit

## 2015-07-20 ENCOUNTER — Ambulatory Visit: Payer: Medicare Other | Admitting: Family Medicine

## 2015-07-26 ENCOUNTER — Encounter: Payer: Self-pay | Admitting: Family Medicine

## 2015-08-04 ENCOUNTER — Ambulatory Visit: Payer: Medicare Other | Admitting: Family Medicine

## 2015-08-22 ENCOUNTER — Encounter: Payer: Self-pay | Admitting: Family Medicine

## 2015-08-22 ENCOUNTER — Ambulatory Visit (INDEPENDENT_AMBULATORY_CARE_PROVIDER_SITE_OTHER): Payer: Medicare Other | Admitting: Family Medicine

## 2015-08-22 VITALS — BP 128/82 | Ht 74.0 in | Wt 177.2 lb

## 2015-08-22 DIAGNOSIS — M545 Low back pain, unspecified: Secondary | ICD-10-CM

## 2015-08-22 DIAGNOSIS — G8929 Other chronic pain: Secondary | ICD-10-CM

## 2015-08-22 MED ORDER — OXYCODONE HCL 10 MG PO TABS: ORAL_TABLET | ORAL | Status: DC

## 2015-08-22 MED ORDER — GABAPENTIN 100 MG PO CAPS: 100.0000 mg | ORAL_CAPSULE | Freq: Three times a day (TID) | ORAL | Status: DC
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
## 2015-10-31 ENCOUNTER — Telehealth: Payer: Self-pay | Admitting: Family Medicine
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
## 2015-10-31 NOTE — Telephone Encounter (Signed)
Patient would like a refill on oxycodone 10 mg completely out.

## 2015-11-25 NOTE — Telephone Encounter (Signed)
According to our system, [facility] but was a "No [facility]"

## 2015-11-25 NOTE — Telephone Encounter (Signed)
I would suggest that the patient re-contact [facility] to see if they would be willing to set him up for another appointment, feel free to send him a letter regarding this

## 2015-12-04 ENCOUNTER — Encounter (HOSPITAL_COMMUNITY): Payer: Self-pay

## 2015-12-04 DIAGNOSIS — F17210 Nicotine dependence, cigarettes, uncomplicated: Secondary | ICD-10-CM | POA: Insufficient documentation

## 2015-12-04 DIAGNOSIS — M545 Low back pain: Secondary | ICD-10-CM | POA: Diagnosis present

## 2015-12-04 DIAGNOSIS — M1990 Unspecified osteoarthritis, unspecified site: Secondary | ICD-10-CM | POA: Insufficient documentation

## 2015-12-04 DIAGNOSIS — G8929 Other chronic pain: Secondary | ICD-10-CM | POA: Diagnosis not present

## 2015-12-04 NOTE — ED Notes (Signed)
Patient states lower back pain that radiates into bilateral legs. Patient states he has been off his pain medication for "a couple of months" and is now having lower back pain.

## 2015-12-05 ENCOUNTER — Emergency Department (HOSPITAL_COMMUNITY)
Admission: EM | Admit: 2015-12-05 | Discharge: 2015-12-05 | Disposition: A | Discharge status: Home / Self Care | Payer: Medicare Other | Attending: Emergency Medicine | Admitting: Emergency Medicine

## 2015-12-05 DIAGNOSIS — M545 Low back pain, unspecified: Secondary | ICD-10-CM

## 2015-12-05 DIAGNOSIS — G8929 Other chronic pain: Secondary | ICD-10-CM

## 2015-12-05 MED ORDER — PREDNISONE 20 MG PO TABS: ORAL_TABLET | ORAL | Status: DC

## 2015-12-05 MED ORDER — METHOCARBAMOL 500 MG PO TABS: 500.0000 mg | ORAL_TABLET | Freq: Two times a day (BID) | ORAL | Status: DC

## 2015-12-05 MED ORDER — ETODOLAC 400 MG PO TABS: 400.0000 mg | ORAL_TABLET | Freq: Two times a day (BID) | ORAL | Status: DC | PRN

## 2015-12-05 NOTE — Discharge Instructions (Signed)

## 2015-12-05 NOTE — ED Provider Notes (Signed)
CSN: [id number]     Arrival date & time 12/04/15  2134 History   By signing my name below, I, [facility], attest that this documentation has been prepared under the direction and in the presence of Gilda Crease, MD. Electronically Signed: Randell Patient, ED Scribe. 12/05/2015. 3:52 AM.    Chief Complaint  Patient presents with  . Back Pain    The history is provided by the patient. No language interpreter was used.   HPI Comments: [name] is a 45 y.o. male with an hx of spinal fusion and chronic back pain who presents to the Emergency Department complaining of constant, throbbing lower back pain that radiates into his bilateral legs onset yesterday morning that woke him from sleep. Patient reports no recent injuries or falls and states that he was asleep when pain began 21 hours ago. He endorses associated difficult ambulation secondary to pain. He has used a heating pad and taken OTC pain medication without relief. Per patient, he has had a spinal fusion performed and was prescribed a pain medication by his PCP Dr. Gerda Diss but states that he has not taken this medication recently. Denies any other symptoms currently.  Past Medical History  Diagnosis Date  . Chronic back pain   . History of spinal fusion   . History of degenerative disc disease   . Arthritis    Past Surgical History  Procedure Laterality Date  . Spinal fusion    . 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 No pertinent family history. Social History  Substance Use Topics  . Smoking status: Current Every Day Smoker -- 1.00 packs/day    Types: Cigarettes  . Smokeless tobacco: None  . Alcohol Use: No    Review of Systems  Musculoskeletal: Positive for back pain.  All other systems reviewed and are negative.     Allergies  Toradol  Home  Medications   Prior to Admission medications   Medication Sig Start Date End Date Taking? Authorizing Provider  acetaminophen (TYLENOL) 325 MG tablet Take 650-975 mg by mouth every 6 (six) hours as needed for mild pain.    Historical Provider, MD  cyclobenzaprine (FLEXERIL) 10 MG tablet TAKE 1 TABLET BY MOUTH THREE TIMES DAILY AS NEEDED FOR MUSCLE SPASMS 01/14/15   Merlyn Albert, MD  etodolac (LODINE) 400 MG tablet Take 1 tablet (400 mg total) by mouth 2 (two) times daily as needed for moderate pain. 12/05/15   Gilda Crease, MD  fluticasone (FLONASE) 50 MCG/ACT nasal spray Place 2 sprays into the nose daily. Patient taking differently: Place 2 sprays into the nose daily as needed for allergies.  01/09/13   Babs Sciara, MD  gabapentin (NEURONTIN) 100 MG capsule Take 1 capsule (100 mg total) by mouth 3 (three) times daily. 08/22/15   Babs Sciara, MD  Ketotifen Fumarate (ALLERGY EYE DROPS OP) Apply 2 drops to eye daily as needed (allergies).    Historical Provider, MD  methocarbamol (ROBAXIN) 500 MG tablet Take 1 tablet (500 mg total) by mouth 2 (two) times daily. 12/05/15   Gilda Crease, MD  naproxen (NAPROSYN) 500 MG tablet Take 1 tablet (500 mg total) by mouth 2 (two) times daily with a meal. Patient taking differently: Take 500 mg by mouth 2 (two) times daily as needed.  10/16/13   Scott A  Gerda DissLuking, MD  Oxycodone HCl 10 MG TABS One every 6 hours as needed 08/22/15   Babs SciaraScott A Luking, MD  predniSONE (DELTASONE) 20 MG tablet 3 tabs po daily x 3 days, then 2 tabs x 3 days, then 1.5 tabs x 3 days, then 1 tab x 3 days, then 0.5 tabs x 3 days 12/05/15   Gilda Creasehristopher J Pollina, MD  traZODone (DESYREL) 50 MG tablet Take 0.5-1 tablets (25-50 mg total) by mouth at bedtime as needed for sleep. 02/24/15   Babs SciaraScott A Luking, MD   BP 102/74 mmHg  Pulse 75  Temp(Src) 98.2 F (36.8 C) (Oral)  Resp 16  SpO2 97% Physical Exam  Constitutional: He is oriented to person, place, and time. He appears  well-developed and well-nourished. No distress.  Speech is slow and slurred.  HENT:  Head: Normocephalic and atraumatic.  Right Ear: Hearing normal.  Left Ear: Hearing normal.  Nose: Nose normal.  Mouth/Throat: Oropharynx is clear and moist and mucous membranes are normal.  Pupils very small.  Eyes: Conjunctivae and EOM are normal. Pupils are equal, round, and reactive to light.  Neck: Normal range of motion. Neck supple.  Cardiovascular: Regular rhythm, S1 normal and S2 normal.  Exam reveals no gallop and no friction rub.   No murmur heard. Pulmonary/Chest: Effort normal and breath sounds normal. No respiratory distress. He exhibits no tenderness.  Abdominal: Soft. Normal appearance and bowel sounds are normal. There is no hepatosplenomegaly. There is no tenderness. There is no rebound, no guarding, no tenderness at McBurney's point and negative Murphy's sign. No hernia.  Musculoskeletal: Normal range of motion.  Diffuse lower back tenderness.  Neurological: He is alert and oriented to person, place, and time. He has normal strength. No cranial nerve deficit or sensory deficit. Coordination normal. GCS eye subscore is 4. GCS verbal subscore is 5. GCS motor subscore is 6.  Normal strength and sensation.  Skin: Skin is warm, dry and intact. No rash noted. No cyanosis.  Psychiatric: He has a normal mood and affect. His speech is normal and behavior is normal. Thought content normal.  Nursing note and vitals reviewed.   ED Course  Procedures   DIAGNOSTIC STUDIES: Oxygen Saturation is 97% on RA, normal by my interpretation.    COORDINATION OF CARE: 12:46 AM Will order pain medication. Advised pt to follow-up with his PCP for a referral to a pain management clinic. Discussed treatment plan with pt at bedside and pt agreed to plan.    MDM   Final diagnoses:  Chronic low back pain   Patient presents to the ER for evaluation of low back pain. He reports that he is expressing pain in  his low back that radiates down both legs. He has a history of chronic back pain, status post lumbar fusion. He denies any recent injury. Patient has normal strength, sensation and reflexes. No foot drop. No saddle anesthesia. He has not indicated any changes bowel or bladder habits. There is nothing to indicate an acute change in his condition or an acute neurosurgical process. He was recently being treated with Roxicodone by his doctor, but his doctor has stopped prescribing. Patient reports increased pain since then. This is chronic pain exacerbated by lack of narcotic analgesia. Patient was told that he would not be prescribed narcotics for his chronic pain, was prescribed prednisone taper.  I personally performed the services described in this documentation, which was scribed in my presence. The recorded information has been reviewed and is accurate.  Gilda Crease, MD 12/05/15 [phone]

## 2016-01-03 ENCOUNTER — Ambulatory Visit (INDEPENDENT_AMBULATORY_CARE_PROVIDER_SITE_OTHER): Payer: Medicare Other | Admitting: Family Medicine

## 2016-01-03 ENCOUNTER — Encounter: Payer: Self-pay | Admitting: Family Medicine

## 2016-01-03 VITALS — BP 128/82 | Ht 74.0 in | Wt 177.0 lb

## 2016-01-03 DIAGNOSIS — G959 Disease of spinal cord, unspecified: Secondary | ICD-10-CM

## 2016-01-03 MED ORDER — PREDNISONE 20 MG PO TABS: ORAL_TABLET | ORAL | Status: DC

## 2016-01-03 MED ORDER — GABAPENTIN 100 MG PO CAPS: 100.0000 mg | ORAL_CAPSULE | Freq: Three times a day (TID) | ORAL | Status: DC

## 2016-01-03 MED ORDER — OXYCODONE-ACETAMINOPHEN 10-325 MG PO TABS: 1.0000 | ORAL_TABLET | ORAL | Status: DC | PRN

## 2016-01-03 NOTE — Progress Notes (Signed)
   Subjective:    Patient ID: [name], male    DOB: [birth year], 45 y.o.   MRN: [id number]  HPI Symptoms been going on for the past 4-5 weeks he's been suffering with significant pain is gone to the ER is had various evaluation and unfortunately not getting much better patient relates severe pain keeps him awake at night somewhat better if he keeps his arm up any air he is had previous surgery of his neck   Review of Systems    patient with significant pain right-sided neck into the trapezius into the arm patient also with significant discomfort with movement in relate some weakness in the right arm Objective:   Physical Exam Significant discomfort in the neck with radiation into the right trapezius right into the right arm. Subjective tingling into the index and thumb reflexes good strength is good lungs clear heart regular positive straight leg raise on the legs worse on the left than the right       Assessment & Plan:  Cervical myelopathy possible underlying spondylosis severe pain down the right arm I'm sympathetic to this patient's situation. Percocet 10 mg 1 every 4 hours when necessary severe pain caution drowsiness #30. We will not be managing long-term chronic pain. The patient will need to follow through with a chronic pain management center. I've offered this to him he is thinking about it currently  We will be doing gabapentin 3 times daily he will slowly titrate on this along with prednisone taper he may use ibuprofen when necessary.  The patient is not significantly better over the next 2-3 weeks next step would be MRI of cervical spine, patient may benefit from seeing a spine specialist and possible more surgery if ongoing troubles

## 2016-01-10 ENCOUNTER — Other Ambulatory Visit: Payer: Self-pay | Admitting: Family Medicine

## 2016-01-10 ENCOUNTER — Telehealth: Payer: Self-pay | Admitting: Family Medicine

## 2016-01-10 MED ORDER — OXYCODONE-ACETAMINOPHEN 10-325 MG PO TABS: 1.0000 | ORAL_TABLET | Freq: Three times a day (TID) | ORAL | Status: DC | PRN

## 2016-01-10 MED ORDER — PREDNISONE 20 MG PO TABS: ORAL_TABLET | ORAL | Status: DC

## 2016-01-10 MED ORDER — GABAPENTIN 300 MG PO CAPS: 300.0000 mg | ORAL_CAPSULE | Freq: Three times a day (TID) | ORAL | Status: DC

## 2016-01-10 NOTE — Telephone Encounter (Signed)
Notified patient will need to increase gabapentin start taking 2 capsules 3 times daily for the next 7 days then start 300 mg capsule 1 3 times a day #90 with 4 refills and at that point cancel the prescription of the 100 mg capsules. #2 may have refill on prednisone taper #3 may have a prescription for Percocet 10 mg/325 mg one every 8 hours when necessary pain #21. We will not be able to be the patient's ongoing prescriber of pain medication. Patient has 2 complex of the situation  recommend referral back to pain management. Patient verbalized understanding.

## 2016-01-10 NOTE — Telephone Encounter (Signed)
[facility] script upfront)

## 2016-01-10 NOTE — Telephone Encounter (Signed)
The patient will need to increase gabapentin start taking 2 capsules 3 times daily for the next 7 days then start 300 mg capsule 1 3 times a day #90 with 4 refills and at that point cancel the prescription of the 100 mg capsules. #2 may have refill on prednisone taper #3 may have a prescription for Percocet 10 mg/325 mg one every 8 hours when necessary pain #21. We will not be able to be the patient's ongoing prescriber of pain medication. Patient has 2 complex of the situation I recommend referral back to pain management.

## 2016-01-10 NOTE — Telephone Encounter (Signed)
Patient is requesting refills on percocet 10/325, and prednisone 20 mg last filled on 01/03/2016.

## 2016-01-10 NOTE — Addendum Note (Signed)
Addended by: Dereck LigasJOHNSON, Kelsha Older P on: 01/10/2016 10:21 AM   Modules accepted: Orders

## 2016-01-11 NOTE — Telephone Encounter (Signed)
error 

## 2016-01-12 ENCOUNTER — Encounter: Payer: Self-pay | Admitting: Family Medicine

## 2016-01-12 NOTE — Telephone Encounter (Signed)
Mailed letter to pt asking for him to contact [facility] to reschedule appointment

## 2016-01-29 ENCOUNTER — Emergency Department (HOSPITAL_COMMUNITY)
Admission: EM | Admit: 2016-01-29 | Discharge: 2016-01-29 | Disposition: A | Discharge status: Home / Self Care | Payer: Medicare Other | Attending: Emergency Medicine | Admitting: Emergency Medicine

## 2016-01-29 ENCOUNTER — Encounter (HOSPITAL_COMMUNITY): Payer: Self-pay | Admitting: Emergency Medicine

## 2016-01-29 DIAGNOSIS — F17210 Nicotine dependence, cigarettes, uncomplicated: Secondary | ICD-10-CM | POA: Insufficient documentation

## 2016-01-29 DIAGNOSIS — M545 Low back pain: Secondary | ICD-10-CM | POA: Diagnosis not present

## 2016-01-29 DIAGNOSIS — M549 Dorsalgia, unspecified: Secondary | ICD-10-CM | POA: Diagnosis not present

## 2016-01-29 DIAGNOSIS — G8929 Other chronic pain: Secondary | ICD-10-CM | POA: Diagnosis not present

## 2016-01-29 MED ORDER — MORPHINE SULFATE (PF) 4 MG/ML IV SOLN
8.0000 mg | Freq: Once | INTRAVENOUS | Status: AC
  Administered 2016-01-29: 8 mg via INTRAMUSCULAR
  Filled 2016-01-29: qty 2

## 2016-01-29 MED ORDER — PROCHLORPERAZINE MALEATE 5 MG PO TABS
10.0000 mg | ORAL_TABLET | Freq: Once | ORAL | Status: AC
  Administered 2016-01-29: 10 mg via ORAL
  Filled 2016-01-29: qty 2

## 2016-01-29 MED ORDER — CYCLOBENZAPRINE HCL 10 MG PO TABS
10.0000 mg | ORAL_TABLET | Freq: Once | ORAL | Status: AC
  Administered 2016-01-29: 10 mg via ORAL
  Filled 2016-01-29: qty 1

## 2016-01-29 MED ORDER — OXYCODONE-ACETAMINOPHEN 5-325 MG PO TABS: 1.0000 | ORAL_TABLET | ORAL | Status: DC | PRN

## 2016-01-29 NOTE — ED Provider Notes (Signed)
CSN: [id number]     Arrival date & time 01/29/16  1038 History   First MD Initiated Contact with Patient 01/29/16 1041     No chief complaint on file.    (Consider location/radiation/quality/duration/timing/severity/associated sxs/prior Treatment) HPI Comments: Patient is a 45 year old male who presents to the emergency department with upper back pain.  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 He has a history of arthritis. He has had spinal fusion surgery. He is seen by Dr.Luking, and [facility] but he has not been to the pain clinic yet because of financial reasons.  The patient states he has been dealing with of back problems for years. Most recently though since March 2017 he's been having increase in his pain. He states that he has pain between the shoulder blades, it sometimes radiates into his chest area, and he has pain that goes along the right side of his neck and radiates to the arm and hand. He has been treated with narcotic pain medication, steroids, and Neurontin in the past. He states he has noticed some improvement with the steroids and Neurontin, but no true relief. The patient states he has limited range of motion involving his neck now and he presents to the emergency department primarily for some assistance with pain relief. There've been no loss of bowel or bladder function. There's been no loss of control of the upper or lower extremities. His been no frequent falls.  The history is provided by the patient.    Past Medical History  Diagnosis Date  . Chronic back pain   . History of spinal fusion   . History of degenerative disc disease   . Arthritis    Past Surgical History  Procedure Laterality Date  . Spinal fusion    . 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 Social History  Substance Use Topics  . Smoking status: Current Every Day Smoker -- 1.00 packs/day    Types: Cigarettes  . Smokeless tobacco: Not on file  . Alcohol Use: No    Review of Systems  Musculoskeletal: Positive for back pain and arthralgias.  All other systems reviewed and are negative.     Allergies  Toradol  Home Medications   Prior to Admission medications   Medication Sig Start Date End Date Taking? Authorizing Provider  acetaminophen (TYLENOL) 325 MG tablet Take 650-975 mg by mouth every 6 (six) hours as needed for mild pain.    Historical Provider, MD  etodolac (LODINE) 400 MG tablet Take 1 tablet (400 mg total) by mouth 2 (two) times daily as needed for moderate pain. Patient not taking: Reported on 01/03/2016 12/05/15   Gilda Creasehristopher J Pollina, MD  fluticasone [facility](FLONASE) 50 MCG/ACT nasal spray Place 2 sprays into the nose daily. Patient taking differently: Place 2 sprays into the nose daily as needed for allergies.  01/09/13   Babs SciaraScott A Luking, MD  gabapentin (NEURONTIN) 300 MG capsule Take 1 capsule (300 mg total) by mouth 3 (three) times daily. 01/10/16   Babs SciaraScott A Luking, MD  Ketotifen Fumarate (ALLERGY EYE DROPS OP) Apply 2 drops to eye daily as needed (allergies).    Historical Provider, MD  naproxen (NAPROSYN) 500 MG tablet Take 1 tablet (500 mg total) by mouth 2 (two) times daily  with a meal. Patient taking differently: Take 500 mg by mouth 2 (two) times daily as needed.  10/16/13   Babs Sciara, MD  Oxycodone HCl 10 MG TABS One every 6 hours as needed Patient not taking: Reported on 01/03/2016 08/22/15   Babs Sciara, MD  oxyCODONE-acetaminophen (PERCOCET) 10-325 MG tablet Take 1 tablet by mouth every 8 (eight) hours as needed for pain. 01/10/16   Babs Sciara, MD  predniSONE (DELTASONE) 20 MG tablet 3qd for 3d then 2qd for 3d then 1qd for 3d 01/10/16   Babs Sciara, MD  traZODone (DESYREL) 50 MG tablet Take 0.5-1 tablets (25-50 mg  total) by mouth at bedtime as needed for sleep. 02/24/15   Babs Sciara, MD   There were no vitals taken for this visit. Physical Exam  Constitutional: He is oriented to person, place, and time. He appears well-developed and well-nourished.  Non-toxic appearance.  HENT:  Head: Normocephalic.  Right Ear: Tympanic membrane and external ear normal.  Left Ear: Tympanic membrane and external ear normal.  Eyes: EOM and lids are normal. Pupils are equal, round, and reactive to light.  Neck: Normal range of motion. Neck supple. Carotid bruit is not present.  Cardiovascular: Normal rate, regular rhythm, normal heart sounds, intact distal pulses and normal pulses.   Pulmonary/Chest: Breath sounds normal. No respiratory distress.  Abdominal: Soft. Bowel sounds are normal. There is no tenderness. There is no guarding.  Musculoskeletal:       Cervical back: He exhibits decreased range of motion, pain and spasm.       Thoracic back: He exhibits pain and spasm.       Back:  Lymphadenopathy:       Head (right side): No submandibular adenopathy present.       Head (left side): No submandibular adenopathy present.    He has no cervical adenopathy.  Neurological: He is alert and oriented to person, place, and time. He has normal strength. No cranial nerve deficit or sensory deficit.  Pt ambulatory without problem. No gross neuro deficit.  Skin: Skin is warm and dry.  Psychiatric: He has a normal mood and affect. His speech is normal.  Nursing note and vitals reviewed.   ED Course  Patient states he has someone coming to drive for him.   Procedures (including critical care time) Labs Review Labs Reviewed - No data to display  Imaging Review No results found. I have personally reviewed and evaluated these images and lab results as part of my medical decision-making.   EKG Interpretation None      MDM  Patient has a long history of arthritis, and degenerative disc disease involving multiple  areas of his thoracic spine and the lumbar spine. He has spasm and pain on today's examination. I discussed with the patient the importance of him following up with Dr. Wynetta Emery, his neurosurgeon, or the [facility] clinic. We discussed that there were no gross neurologic deficits appreciated on today's emergency department examination.  The patient will be treated with intramuscular morphine, and oral Flexeril. The patient is advised to see Dr. Gerda Diss  tomorrow or this week to update the status of his pain clinic evaluation, and also to establish a plan for breakthrough pain episodes.    Final diagnoses:  None    **I have reviewed nursing notes, vital signs, and all appropriate lab and imaging results for this patient.Ivery Quale, PA-C 01/29/16 1150  Donnetta Hutching, MD 01/29/16 [phone]

## 2016-01-29 NOTE — Discharge Instructions (Signed)
Your vital signs within normal limits today. There no new or emergent changes noted on your examination at this time. Please see Dr.Luking for follow-up on, and also to discuss a planned for your breakthrough episodes of pain. You have been treated today with intramuscular narcotics, muscle relaxant. Please use caution getting around. Chronic Back Pain  When back pain lasts longer than 3 months, it is called chronic back pain.People with chronic back pain often go through certain periods that are more intense (flare-ups).  CAUSES Chronic back pain can be caused by wear and tear (degeneration) on different structures in your back. These structures include:  The bones of your spine (vertebrae) and the joints surrounding your spinal cord and nerve roots (facets).  The strong, fibrous tissues that connect your vertebrae (ligaments). Degeneration of these structures may result in pressure on your nerves. This can lead to constant pain. HOME CARE INSTRUCTIONS  Avoid bending, heavy lifting, prolonged sitting, and activities which make the problem worse.  Take brief periods of rest throughout the day to reduce your pain. Lying down or standing usually is better than sitting while you are resting.  Take over-the-counter or prescription medicines only as directed by your caregiver. SEEK IMMEDIATE MEDICAL CARE IF:   You have weakness or numbness in one of your legs or feet.  You have trouble controlling your bladder or bowels.  You have nausea, vomiting, abdominal pain, shortness of breath, or fainting.   This information is not intended to replace advice given to you by your health care provider. Make sure you discuss any questions you have with your health care provider.   Document Released: 10/11/2004 Document Revised: 11/26/2011 Document Reviewed: 02/21/2015 Elsevier Interactive Patient Education [employer]2016 [employer] Inc.

## 2016-01-29 NOTE — ED Notes (Addendum)
Patient c/o upper back pain, between shoulder blades that radiates into sternum. Per patient painful to take deep breath. Patient also right side neck pain that radiates down arm and into hand. Per patient has had pain since March and was seen here for it. Patient reports taking steroids and Neurontin with some improvement but no true relief. Patient has limited ROM in neck due to pain.

## 2016-02-19 ENCOUNTER — Emergency Department (HOSPITAL_COMMUNITY)
Admission: EM | Admit: 2016-02-19 | Discharge: 2016-02-19 | Disposition: A | Discharge status: Home / Self Care | Payer: Medicare Other | Attending: Emergency Medicine | Admitting: Emergency Medicine

## 2016-02-19 ENCOUNTER — Encounter (HOSPITAL_COMMUNITY): Payer: Self-pay | Admitting: *Deleted

## 2016-02-19 DIAGNOSIS — F17210 Nicotine dependence, cigarettes, uncomplicated: Secondary | ICD-10-CM | POA: Diagnosis not present

## 2016-02-19 DIAGNOSIS — Z791 Long term (current) use of non-steroidal anti-inflammatories (NSAID): Secondary | ICD-10-CM | POA: Insufficient documentation

## 2016-02-19 DIAGNOSIS — M1990 Unspecified osteoarthritis, unspecified site: Secondary | ICD-10-CM | POA: Insufficient documentation

## 2016-02-19 DIAGNOSIS — M549 Dorsalgia, unspecified: Secondary | ICD-10-CM | POA: Diagnosis not present

## 2016-02-19 DIAGNOSIS — M25511 Pain in right shoulder: Secondary | ICD-10-CM | POA: Diagnosis not present

## 2016-02-19 DIAGNOSIS — M546 Pain in thoracic spine: Secondary | ICD-10-CM | POA: Diagnosis not present

## 2016-02-19 DIAGNOSIS — Z79899 Other long term (current) drug therapy: Secondary | ICD-10-CM | POA: Insufficient documentation

## 2016-02-19 MED ORDER — METHOCARBAMOL 500 MG PO TABS
500.0000 mg | ORAL_TABLET | Freq: Once | ORAL | Status: AC
  Administered 2016-02-19: 500 mg via ORAL
  Filled 2016-02-19: qty 1

## 2016-02-19 MED ORDER — BUPIVACAINE HCL (PF) 0.5 % IJ SOLN
30.0000 mL | Freq: Once | INTRAMUSCULAR | Status: AC
  Administered 2016-02-19: 30 mL
  Filled 2016-02-19: qty 30

## 2016-02-19 MED ORDER — IBUPROFEN 800 MG PO TABS
800.0000 mg | ORAL_TABLET | Freq: Once | ORAL | Status: AC
  Administered 2016-02-19: 800 mg via ORAL
  Filled 2016-02-19: qty 1

## 2016-02-19 MED ORDER — METHOCARBAMOL 500 MG PO TABS: 500.0000 mg | ORAL_TABLET | Freq: Two times a day (BID) | ORAL | Status: DC

## 2016-02-19 NOTE — ED Provider Notes (Signed)
Medical screening examination/treatment/procedure(s) were conducted as a shared visit with non-physician practitioner(s) and myself.  I personally evaluated the patient during the encounter.  45 year old male with chronic back problems here with back pain. Today's pain is a little bit new been progressively worsening over last 2 weeks. Has a radiculopathic symptoms down his right arm which are not new and is being worked up with MRI by his primary doctor. Exam here he is a muscle spasm in the paraspinal area around T6 to T8 area. Exactly reproduces his new symptoms.  Likely muscular spasm as cause for his symptoms. Doubt significant emergent worsening of his chronic spinal issues as cause for his symptoms. Plan will be for pain control in the ED and follow-up with his primary doctor as scheduled.   EKG Interpretation   Date/Time:  Sunday February 19 2016 20:15:01 EDT Ventricular Rate:  63 PR Interval:  200 QRS Duration: 96 QT Interval:  430 QTC Calculation: 440 R Axis:   65 Text Interpretation:  Normal sinus rhythm Normal ECG No significant change  since last tracing Confirmed by [facility]ES[facility] MD, Barbara CowerJASON [id number]) on 02/19/2016  8:25:24 PM        Marily MemosJason Demarie Hyneman, MD 02/19/16 2250

## 2016-02-19 NOTE — Discharge Instructions (Signed)
1. Medications: robaxin, naproxyn, usual home medications including resuming your gabapentin 2. Treatment: rest, drink plenty of fluids, gentle stretching as discussed, alternate ice and heat 3. Follow Up: Please followup with your primary doctor in 3 days for discussion of your diagnoses and further evaluation after today's visit; if you do not have a primary care doctor use the resource guide provided to find one;  Return to the ER for worsening back pain, difficulty walking, loss of bowel or bladder control or other concerning symptoms

## 2016-02-19 NOTE — ED Provider Notes (Signed)
CSN: [id number]     Arrival date & time 02/19/16  1906 History  By signing my name below, I, Evon Slack, attest that this documentation has been prepared under the direction and in the presence of [employer], PA-C. Electronically Signed: Evon Slack, ED Scribe. 02/19/2016. 7:55 PM.    Chief Complaint  Patient presents with  . Shoulder Pain   The history is provided by the patient and medical records. No language interpreter was used.   HPI Comments: IRWIN TORAN is a 45 y.o. male with PMHx of chronic back pain, spinal fusion, degenerative disc disease and arthritis who presents to the Emergency Department complaining of right shoulder pain onset several months that has recently worsened about 2 weeks prior. Pt states that the pain is radiating down his arm and into his fingers. Pt states that the pain is also radiating to his back in between his shoulder blades and to his chest . Pt states that he has been taking ibuprofen, tylenol and pain medication with no relief. Pt states that he was also taking Neurontin with no relief.  He however admits that he stopped taking the neurontin 1 week after his Rx.  Pt states that when leaning to the right side he has some relief. Denies bowel/bladder incontinence, nausea, vomiting or diarrhea, diaphoreses, syncope. Pt reports that he is going to see his PCP next week. Pt states that he is suppose to follow up with the pain clinic but has not been able to due to the finances.   Record review shows that pt was seen by Dr. Gerda Diss in April for same and again in the ED in May for same.  Pt with reports of pain radiation at that time and normal ECG.     Past Medical History  Diagnosis Date  . Chronic back pain   . History of spinal fusion   . History of degenerative disc disease   . Arthritis    Past Surgical History  Procedure Laterality Date  . Spinal fusion    . 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 Social History  Substance Use Topics  . Smoking status: Current Every Day Smoker -- 1.00 packs/day    Types: Cigarettes  . Smokeless tobacco: Never Used  . Alcohol Use: No    Review of Systems  Constitutional: Negative for fever, diaphoresis, appetite change, fatigue and unexpected weight change.  HENT: Negative for mouth sores.   Eyes: Negative for visual disturbance.  Respiratory: Negative for cough, chest tightness, shortness of breath and wheezing.   Cardiovascular: Negative for chest pain.  Gastrointestinal: Negative for nausea, vomiting, abdominal pain, diarrhea and constipation.  Endocrine: Negative for polydipsia, polyphagia and polyuria.  Genitourinary: Negative for dysuria, urgency, frequency and hematuria.  Musculoskeletal: Positive for back pain and arthralgias. Negative for neck stiffness.  Skin: Negative for rash.  Allergic/Immunologic: Negative for immunocompromised state.  Neurological: Negative for syncope, light-headedness and headaches.  Hematological: Does not bruise/bleed easily.  Psychiatric/Behavioral: Negative for sleep disturbance. The patient is not nervous/anxious.      Allergies  Toradol  Home Medications   Prior to Admission medications   Medication Sig Start Date End Date Taking? Authorizing Provider  acetaminophen (TYLENOL) 325 MG tablet Take 1,300 mg by mouth every 6 (six) hours as needed for mild pain.   Yes Historical Provider,  MD  ibuprofen (ADVIL,MOTRIN) 200 MG tablet Take 400-600 mg by mouth every 6 (six) hours as needed for mild pain.   Yes Historical Provider, MD  fluticasone (FLONASE) 50 MCG/ACT nasal spray Place 2 sprays into the nose daily. Patient taking differently: Place 2 sprays into the nose daily as needed for allergies.  01/09/13   Babs SciaraScott A Luking, MD  gabapentin (NEURONTIN) 300 MG capsule Take 1 capsule  (300 mg total) by mouth 3 (three) times daily. 01/10/16   Babs SciaraScott A Luking, MD  methocarbamol (ROBAXIN) 500 MG tablet Take 1 tablet (500 mg total) by mouth 2 (two) times daily. 02/19/16   Deonta Bomberger, PA-C  naproxen (NAPROSYN) 500 MG tablet Take 1 tablet (500 mg total) by mouth 2 (two) times daily with a meal. Patient taking differently: Take 500 mg by mouth 2 (two) times daily as needed.  10/16/13   Babs SciaraScott A Luking, MD   BP 116/72 mmHg  Pulse 61  Temp(Src) 97.9 F (36.6 C) (Oral)  Resp 16  Ht 6\' 1"  (1.854 m)  Wt 81.647 kg  BMI 23.75 kg/m2  SpO2 98%   Physical Exam  Constitutional: He appears well-developed and well-nourished. No distress.  Awake, alert, nontoxic appearance  HENT:  Head: Normocephalic and atraumatic.  Mouth/Throat: Oropharynx is clear and moist. No oropharyngeal exudate.  Eyes: Conjunctivae are normal. No scleral icterus.  Neck: Normal range of motion. Neck supple.  Full ROM without pain  Cardiovascular: Normal rate, regular rhythm, S1 normal, S2 normal, normal heart sounds and intact distal pulses.   No murmur heard. Pulses:      Radial pulses are 2+ on the right side, and 2+ on the left side.  Capillary refill < 2 sec in bilateral upper extremities Radial pulses present and equal in BUE  Pulmonary/Chest: Effort normal and breath sounds normal. No accessory muscle usage. No respiratory distress. He has no decreased breath sounds. He has no wheezes.  Equal chest expansion Clear and equal breath sounds  Abdominal: Soft. Bowel sounds are normal. He exhibits no distension and no mass. There is no tenderness. There is no rebound and no guarding.  Musculoskeletal: He exhibits no edema.       Cervical back: He exhibits decreased range of motion.       Thoracic back: He exhibits decreased range of motion.       Lumbar back: He exhibits decreased range of motion, pain and spasm.       Back:  Decreased range of motion of the T-spine and L-spine due to pain No midline  tenderness to the  T-spine or L-spine Tenderness to palpation of the paraspinous muscles of the upper T spine and right trapezius R shoulder: FROM without significant pain  Lymphadenopathy:    He has no cervical adenopathy.  Neurological: He is alert. He has normal reflexes.  Reflex Scores:      Bicep reflexes are 2+ on the right side and 2+ on the left side.      Brachioradialis reflexes are 2+ on the right side and 2+ on the left side.      Patellar reflexes are 2+ on the right side and 2+ on the left side.      Achilles reflexes are 2+ on the right side and 2+ on the left side. Speech is clear and goal oriented, follows commands Normal 5/5 strength in upper and lower extremities bilaterally including dorsiflexion and plantar flexion, strong and equal grip strength Sensation normal to light and sharp touch Moves  extremities without ataxia, coordination intact Normal gait Normal balance No Clonus   Skin: Skin is warm and dry. No rash noted. He is not diaphoretic. No erythema.  Psychiatric: He has a normal mood and affect. His behavior is normal.  Nursing note and vitals reviewed.   ED Course  Procedures (including critical care time) DIAGNOSTIC STUDIES: Oxygen Saturation is 96% on RA, normal by my interpretation.    COORDINATION OF CARE: 7:55 PM-Discussed treatment plan with pt at bedside and pt agreed to plan.     EKG Interpretation   Date/Time:  Sunday February 19 2016 20:15:01 EDT Ventricular Rate:  63 PR Interval:  200 QRS Duration: 96 QT Interval:  430 QTC Calculation: 440 R Axis:   65 Text Interpretation:  Normal sinus rhythm Normal ECG No significant change  since last tracing Confirmed by [facility] MD, Barbara Cower [phone]) on 02/19/2016  8:25:24 PM    Trigger point Injection:   Patient given 0.5% Marcaine injection in trigger point. He consents verbally to this.   Procedure: With alcohol prep of the right paraspinal musculature near the medial spine of the scapula.   Palpable firm area of tenderness consistent with trigger point is localized. It was approached with 25-gauge needle and 10 mL of Marcaine was injected.  Tolerated this well with immediate pain relief.   MDM   Final diagnoses:  Right shoulder pain  Upper back pain on right side  Trigger point with back pain   Erron A Lybeck presents with back pain that radiates into the right arm and into the chest. It has been constant for weeks.  Pt is requesting pain control.  He has equal pulses and normal neuro exam.  Denies ripping or tearing pain in the chest.  Denies associated N/V, diaphoresis, syncope.  Highly doubt aortic dissection at this time.  His ECG is unchanged from previous.  He has a RRR and no new murmurs.  Doubt ACS at this time.  Back pain is reproducible with palpation.  Trigger point identified.  Pt given antiinflammatories in the ED along with muscle relaxer with some relief.  He was also given a trigger injection with immediate relief and no complications.  Discussed reasons to return to the ED.  Pt and spouse state understanding and are in agreement with the plan.    I personally performed the services described in this documentation, which was scribed in my presence. The recorded information has been reviewed and is accurate.  The patient was discussed with and seen by Dr. Clayborne Dana who agrees with the treatment plan.       Dahlia Client Caley Ciaramitaro, PA-C 02/19/16 6045  Marily Memos, MD 02/19/16 [phone]

## 2016-02-19 NOTE — ED Notes (Signed)
Pt c/o right shoulder pain that became worse a week ago, denies any new injury, states that he has had problems with his shoulder in the past, recently seen in er 01/29/2016 for same,

## 2016-02-21 ENCOUNTER — Ambulatory Visit (INDEPENDENT_AMBULATORY_CARE_PROVIDER_SITE_OTHER): Payer: Medicare Other | Admitting: Family Medicine

## 2016-02-21 ENCOUNTER — Encounter: Payer: Self-pay | Admitting: Family Medicine

## 2016-02-21 VITALS — BP 120/82 | Ht 74.0 in | Wt 178.5 lb

## 2016-02-21 DIAGNOSIS — G959 Disease of spinal cord, unspecified: Secondary | ICD-10-CM | POA: Diagnosis not present

## 2016-02-21 MED ORDER — OXYCODONE-ACETAMINOPHEN 10-325 MG PO TABS: 1.0000 | ORAL_TABLET | ORAL | Status: DC | PRN

## 2016-02-21 NOTE — Progress Notes (Signed)
   Subjective:    Patient ID: [name], male    DOB: June 18, 1971, 45 y.o.   MRN: [id number]  Shoulder Pain  This is a chronic problem. The current episode started more than 1 year ago. He has tried acetaminophen and NSAIDS for the symptoms.  Back Pain This is a chronic problem. The current episode started more than 1 year ago. The pain is present in the lumbar spine. He has tried NSAIDs (Tylenol, Robaxin) for the symptoms.   Patient would like to discuss getting an MRI.  Patient states the pain is severe. He has had off-and-on neck problems for years and it's been worse over the past several months he has been under our care over the past 60 days he was seen back in April and he was seen in the ER states several times in May and was seen today as a follow-up he relates severe pain in the neck into the trapezius down the arm. He has used Percocet occasionally to help in this is helped minimal he is also tried gabapentin and this is had minimal L and anti-inflammatories and had no help he is done stretching exercises as shown along with cold and warm compresses with no help patient states pain is getting progressively worse. Review of Systems  Musculoskeletal: Positive for back pain.  Subjective discomfort on the right arm subjective numbness into the hand especially the ring and small finger patient with pain and discomfort down the neck into trapezius rhomboid region down triceps region into the small and ring finger.   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 The pain is in the right paraspinal muscles radiating into trapezius and rhomboid region on the right side patient states the pain radiates down the back of the tricep into the arm and hand. He does have some weakness of the tricep plus some weakness of for muscle and to some degree in his hand as well pulses are normal no cyanosis no tremor reflexes diminished on the right side but present lungs are clear hearts regular  Greater than 25 minutes is spent this patient discussing his neck pain, pain control, use of medications stretches, discussion of diagnostic progression including cervical spine x-rays and MRI     Assessment & Plan:  Severe cervical pain with myelopathy and radicular pain down the right arm with weakness patient has tried 8 weeks of treatments including anti-inflammatories stretches compresses and gabapentin without having any success patients issue is getting progressively worse causing frequent visits to the ER and the use of pain medication given his situation in the progressive nature I believe that MRI of the cervical spine is warranted. Small prescription of Percocet given to the patient caution drowsiness home use only not for sleep

## 2016-02-22 ENCOUNTER — Ambulatory Visit (HOSPITAL_COMMUNITY)
Admission: RE | Admit: 2016-02-22 | Discharge: 2016-02-22 | Disposition: A | Discharge status: Home / Self Care | Payer: Medicare Other | Source: Ambulatory Visit | Attending: Family Medicine | Admitting: Family Medicine

## 2016-02-22 DIAGNOSIS — G959 Disease of spinal cord, unspecified: Secondary | ICD-10-CM | POA: Diagnosis not present

## 2016-02-22 DIAGNOSIS — Z981 Arthrodesis status: Secondary | ICD-10-CM | POA: Diagnosis not present

## 2016-02-23 ENCOUNTER — Other Ambulatory Visit: Payer: Self-pay | Admitting: *Deleted

## 2016-02-23 NOTE — Progress Notes (Signed)
Results discussed with patient. Patient advised c spine xray showed degenerative changes asnd spurring- MRI cervical spine [facility] 03/02/16 @12pm  arrive at 11:45am to register. Patient verbalized understanding.

## 2016-03-02 ENCOUNTER — Ambulatory Visit (HOSPITAL_COMMUNITY): Payer: Medicare Other

## 2016-03-02 ENCOUNTER — Ambulatory Visit (HOSPITAL_COMMUNITY)
Admission: RE | Admit: 2016-03-02 | Discharge: 2016-03-02 | Disposition: A | Discharge status: Home / Self Care | Payer: Medicare Other | Source: Ambulatory Visit | Attending: Family Medicine | Admitting: Family Medicine

## 2016-03-02 ENCOUNTER — Telehealth: Payer: Self-pay | Admitting: Family Medicine

## 2016-03-02 ENCOUNTER — Other Ambulatory Visit: Payer: Self-pay | Admitting: *Deleted

## 2016-03-02 DIAGNOSIS — M47892 Other spondylosis, cervical region: Secondary | ICD-10-CM | POA: Insufficient documentation

## 2016-03-02 DIAGNOSIS — G959 Disease of spinal cord, unspecified: Secondary | ICD-10-CM | POA: Insufficient documentation

## 2016-03-02 DIAGNOSIS — M5031 Other cervical disc degeneration,  high cervical region: Secondary | ICD-10-CM | POA: Diagnosis not present

## 2016-03-02 DIAGNOSIS — M50222 Other cervical disc displacement at C5-C6 level: Secondary | ICD-10-CM | POA: Diagnosis not present

## 2016-03-02 MED ORDER — OXYCODONE-ACETAMINOPHEN 10-325 MG PO TABS: 1.0000 | ORAL_TABLET | ORAL | Status: DC | PRN

## 2016-03-02 MED ORDER — METHOCARBAMOL 500 MG PO TABS: 500.0000 mg | ORAL_TABLET | Freq: Two times a day (BID) | ORAL | Status: DC

## 2016-03-02 NOTE — Telephone Encounter (Signed)
Scripts ready for pickup. Left messsage notifying pt

## 2016-03-02 NOTE — Telephone Encounter (Signed)
Last seen on 6/6 and given #20

## 2016-03-02 NOTE — Telephone Encounter (Signed)
Ref times one on both

## 2016-03-02 NOTE — Telephone Encounter (Signed)
oxyCODONE-acetaminophen (PERCOCET) 10-325 MG tablet methocarbamol (ROBAXIN) 500 MG tablet  Pt would like to have these refills  States he ran out a couple of days ago Has been trying to take advil and tylenol, aleve

## 2016-03-06 ENCOUNTER — Telehealth: Payer: Self-pay

## 2016-03-06 DIAGNOSIS — M47812 Spondylosis without myelopathy or radiculopathy, cervical region: Secondary | ICD-10-CM

## 2016-03-06 MED ORDER — OXYCODONE-ACETAMINOPHEN 10-325 MG PO TABS: 1.0000 | ORAL_TABLET | Freq: Four times a day (QID) | ORAL | Status: DC | PRN

## 2016-03-06 NOTE — Telephone Encounter (Signed)
May have a prescription for oxycodone 10 mg/325 mg, #30, one 4 times a day when necessary pain I will not refill Robaxin this combination can cause too much drowsiness

## 2016-03-06 NOTE — Telephone Encounter (Signed)
Left message on voicemail notifying patient that script is ready for pickup but muscle relaxer cannot be refilled.

## 2016-03-06 NOTE — Telephone Encounter (Signed)
Patient needs a refill on his percocet and robaxin. Last refilled 03/02/16.

## 2016-03-07 ENCOUNTER — Encounter: Payer: Self-pay | Admitting: Family Medicine

## 2016-03-19 ENCOUNTER — Telehealth: Payer: Self-pay | Admitting: Family Medicine

## 2016-03-19 MED ORDER — OXYCODONE-ACETAMINOPHEN 10-325 MG PO TABS: 1.0000 | ORAL_TABLET | Freq: Three times a day (TID) | ORAL | Status: DC | PRN

## 2016-03-19 NOTE — Telephone Encounter (Signed)
Pt called requesting a refill on his oxyCODONE-acetaminophen (PERCOCET) 10-325 MG tablet.

## 2016-03-19 NOTE — Telephone Encounter (Signed)
Last filled 03/06/16 for #30

## 2016-03-19 NOTE — Telephone Encounter (Signed)
24 tablets 1 3 times a day when necessary, as patient heard anything from neurosurgery-we already sent referral there.

## 2016-03-19 NOTE — Telephone Encounter (Signed)
[facility].

## 2016-03-19 NOTE — Telephone Encounter (Addendum)
Left message to return call 03/19/16

## 2016-03-19 NOTE — Telephone Encounter (Signed)
Left message to return call (prescription up front for pick up)

## 2016-03-22 NOTE — Telephone Encounter (Signed)
Patient picked up prescription per front staff

## 2016-04-02 ENCOUNTER — Telehealth: Payer: Self-pay | Admitting: Family Medicine

## 2016-04-02 NOTE — Telephone Encounter (Signed)
Pt called stating that his appt at [facility] has been rescheduled to Aug 4 and wants to know if Dr. Lorin PicketScott will refill his oxycodone till then.

## 2016-04-03 MED ORDER — OXYCODONE-ACETAMINOPHEN 10-325 MG PO TABS: 1.0000 | ORAL_TABLET | Freq: Three times a day (TID) | ORAL | Status: DC | PRN

## 2016-04-03 NOTE — Telephone Encounter (Signed)
Script printed. [facility]

## 2016-04-03 NOTE — Telephone Encounter (Signed)
Pt stopped by to check on this please advise.

## 2016-04-03 NOTE — Telephone Encounter (Signed)
Pt may have rx for 24 tablets,use sparingly

## 2016-04-10 ENCOUNTER — Encounter (HOSPITAL_COMMUNITY): Payer: Self-pay | Admitting: *Deleted

## 2016-04-10 ENCOUNTER — Telehealth: Payer: Self-pay | Admitting: Family Medicine

## 2016-04-10 ENCOUNTER — Emergency Department (HOSPITAL_COMMUNITY)
Admission: EM | Admit: 2016-04-10 | Discharge: 2016-04-10 | Disposition: A | Discharge status: Home / Self Care | Payer: Medicare Other | Attending: Emergency Medicine | Admitting: Emergency Medicine

## 2016-04-10 DIAGNOSIS — M542 Cervicalgia: Secondary | ICD-10-CM

## 2016-04-10 DIAGNOSIS — M47812 Spondylosis without myelopathy or radiculopathy, cervical region: Secondary | ICD-10-CM | POA: Insufficient documentation

## 2016-04-10 DIAGNOSIS — M50321 Other cervical disc degeneration at C4-C5 level: Secondary | ICD-10-CM | POA: Diagnosis not present

## 2016-04-10 DIAGNOSIS — E785 Hyperlipidemia, unspecified: Secondary | ICD-10-CM | POA: Insufficient documentation

## 2016-04-10 DIAGNOSIS — M1990 Unspecified osteoarthritis, unspecified site: Secondary | ICD-10-CM | POA: Insufficient documentation

## 2016-04-10 DIAGNOSIS — F17210 Nicotine dependence, cigarettes, uncomplicated: Secondary | ICD-10-CM | POA: Insufficient documentation

## 2016-04-10 DIAGNOSIS — M50322 Other cervical disc degeneration at C5-C6 level: Secondary | ICD-10-CM | POA: Diagnosis not present

## 2016-04-10 DIAGNOSIS — M5031 Other cervical disc degeneration,  high cervical region: Secondary | ICD-10-CM | POA: Diagnosis not present

## 2016-04-10 MED ORDER — DEXAMETHASONE SODIUM PHOSPHATE 10 MG/ML IJ SOLN
10.0000 mg | Freq: Once | INTRAMUSCULAR | Status: AC
  Administered 2016-04-10: 10 mg via INTRAMUSCULAR
  Filled 2016-04-10: qty 1

## 2016-04-10 MED ORDER — OXYCODONE-ACETAMINOPHEN 10-325 MG PO TABS: ORAL_TABLET | ORAL | 0 refills | Status: DC

## 2016-04-10 MED ORDER — DIAZEPAM 5 MG/ML IJ SOLN
10.0000 mg | Freq: Once | INTRAMUSCULAR | Status: AC
  Administered 2016-04-10: 10 mg via INTRAMUSCULAR
  Filled 2016-04-10: qty 2

## 2016-04-10 MED ORDER — PREDNISONE 20 MG PO TABS: ORAL_TABLET | ORAL | 0 refills | Status: DC

## 2016-04-10 MED ORDER — GABAPENTIN 300 MG PO CAPS: 300.0000 mg | ORAL_CAPSULE | Freq: Three times a day (TID) | ORAL | 0 refills | Status: DC

## 2016-04-10 MED ORDER — CYCLOBENZAPRINE HCL 10 MG PO TABS: 10.0000 mg | ORAL_TABLET | Freq: Three times a day (TID) | ORAL | 0 refills | Status: DC | PRN

## 2016-04-10 NOTE — Telephone Encounter (Signed)
Left message return call. 04/10/16 

## 2016-04-10 NOTE — ED Triage Notes (Signed)
Pt c/o neck and right shoulder pain due to pinched nerve; pt states he has been dealing with the pain for the last few months and states he has an appointment at [facility] in the next few days

## 2016-04-10 NOTE — ED Provider Notes (Signed)
[facility] Provider Note   CSN: [id number] Arrival date & time: 04/10/16  0110  First Provider Contact:  First MD Initiated Contact with Patient 04/10/16 0448        History   Chief Complaint Chief Complaint  Patient presents with  . Neck Pain    HPI [name] is a 45 y.o. male.  HPI  Patient reports she started having neck pain about 3 or 4 months ago. He states he's had x-rays and MRI which showed "fluid between the vertebrae". He states he is being referred to a neurosurgeon and his first appointment is August 4. Patient states he has been moving and every night his pain is getting worse and is getting less sleep. He states the pain is in the right side of his neck and goes toward his right shoulder and down his right back that is actually along the course of the trapezius. He states it hurts when he moves his head any holds his head slightly lax to the right. He states nothing makes it feel better. He states his fingertips, right hurt however when he pops his knuckles it feels better for a while. Patient does admit he ran out of all of his pain medicine including his gabapentin and his oxycodone and steroids a few days ago.    PCP Dr Gerda Diss  Past Medical History:  Diagnosis Date  . Arthritis   . Chronic back pain   . History of degenerative disc disease   . History of spinal fusion     Patient Active Problem List   Diagnosis Date Noted  . Hyperlipidemia 10/16/2013  . Allergic rhinitis 01/09/2013  . Chronic low back pain 12/17/2012    Past Surgical History:  Procedure Laterality Date  . 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 SPINAL FUSION         Home Medications    Prior to Admission medications   Medication Sig Start Date End Date Taking? Authorizing Provider  acetaminophen (TYLENOL) 325 MG tablet Take 1,300 mg by  mouth every 6 (six) hours as needed for mild pain.    Historical Provider, MD  cyclobenzaprine (FLEXERIL) 10 MG tablet Take 1 tablet (10 mg total) by mouth 3 (three) times daily as needed for muscle spasms. 04/10/16   Devoria Albe, MD  fluticasone (FLONASE) 50 MCG/ACT nasal spray Place 2 sprays into the nose daily. Patient not taking: Reported on 02/21/2016 01/09/13   Babs Sciara, MD  gabapentin (NEURONTIN) 300 MG capsule Take 1 capsule (300 mg total) by mouth 3 (three) times daily. 04/10/16   Devoria Albe, MD  ibuprofen (ADVIL,MOTRIN) 200 MG tablet Take 400-600 mg by mouth every 6 (six) hours as needed for mild pain.    Historical Provider, MD  methocarbamol (ROBAXIN) 500 MG tablet Take 1 tablet (500 mg total) by mouth 2 (two) times daily. 03/02/16   Merlyn Albert, MD  naproxen (NAPROSYN) 500 MG tablet Take 1 tablet (500 mg total) by mouth 2 (two) times daily with a meal. Patient taking differently: Take 500 mg by mouth 2 (two) times daily as needed.  10/16/13   Babs Sciara, MD  oxyCODONE-acetaminophen (PERCOCET) 10-325 MG tablet Take 1 tablet by mouth every 8 (eight) hours as needed for pain. 04/03/16   Babs Sciara, MD  predniSONE (DELTASONE) 20 MG tablet Take 3 po QD x  3d , then 2 po QD x 3d then 1 po QD x 3d 04/10/16   Devoria Albe, MD    Family History History reviewed. No pertinent family history.  Social History Social History  Substance Use Topics  . Smoking status: Current Every Day Smoker    Packs/day: 1.00    Types: Cigarettes  . Smokeless tobacco: Never Used  . Alcohol use No     Allergies   Toradol [ketorolac tromethamine]   Review of Systems Review of Systems  All other systems reviewed and are negative.    Physical Exam Updated Vital Signs BP 108/71 (BP Location: Left Arm)   Pulse 66   Temp 97.9 F (36.6 C) (Oral)   Resp 18   Ht 6\' 1"  (1.854 m)   Wt 180 lb (81.6 kg)   SpO2 96%   BMI 23.75 kg/m   Physical Exam  Constitutional: He is oriented to person,  place, and time. He appears well-developed and well-nourished.  Non-toxic appearance. He does not appear ill. He appears distressed.  HENT:  Head: Normocephalic and atraumatic.  Right Ear: External ear normal.  Left Ear: External ear normal.  Nose: Nose normal. No mucosal edema or rhinorrhea.  Mouth/Throat: Oropharynx is clear and moist and mucous membranes are normal. No dental abscesses or uvula swelling.  Eyes: Conjunctivae and EOM are normal. Pupils are equal, round, and reactive to light.  Neck: Full passive range of motion without pain.    Patient is holding his head slightly flexed to the right  Cardiovascular: Normal rate.   Pulmonary/Chest: Effort normal. No respiratory distress. He has no rhonchi. He exhibits no crepitus.  Abdominal: Normal appearance. There is no rebound.  Musculoskeletal: Normal range of motion. He exhibits no edema or tenderness.       Back:  Moves all extremities well. Patient's pain seems to correspond to the course of the trapezius muscle.  Neurological: He is alert and oriented to person, place, and time. He has normal strength. No cranial nerve deficit.  Grips are equal, brachial reflexes are 2+ and equal  Skin: Skin is warm, dry and intact. No rash noted. No erythema. No pallor.  Psychiatric: He has a normal mood and affect. His speech is normal and behavior is normal. His mood appears not anxious.  Nursing note and vitals reviewed.    ED Treatments / Results   Medications  dexamethasone (DECADRON) injection 10 mg (not administered)  diazepam (VALIUM) injection 10 mg (not administered)     Patient has Toradol allergy. He was given extra drawn and Valium IM for his muscle spasm pain. I advised him that our policy is we do not prescribe chronic pain medication, he will need to discuss that with his primary care doctor. He has no signs of acute neurological injury such as incontinence or loss of reflexes or loss of grip strength.  Patient has a ride  home.   Radiology No results found.   Study Result   CLINICAL DATA:  Right neck pain with tingling, numbness, and weakness.  EXAM: MRI CERVICAL SPINE WITHOUT CONTRAST  TECHNIQUE: Multiplanar, multisequence MR imaging of the cervical spine was performed. No intravenous contrast was administered.  COMPARISON:  Radiographs of 02/22/2016 and prior MRI from 05/18/2010    IMPRESSION: 1. Cervical spondylosis and degenerative disc disease, causing prominent impingement at C5-6 ; moderate to prominent impingement at C4-5 ; moderate impingement at C3-4 and C7-T1; and mild to moderate impingement at C2- 3, as detailed above. In general, the  impingement is similar to the prior exam, except for at C5-6 were it is mildly worsened.   Electronically Signed   By: Gaylyn Rong M.D.   On: 03/02/2016 17:04    Procedures   Initial Impression / Assessment and Plan / ED Course  I have reviewed the triage vital signs and the nursing notes.  Pertinent labs & imaging results that were available during my care of the patient were reviewed by me and considered in my medical decision making (see chart for details).      Final Clinical Impressions(s) / ED Diagnoses   Final diagnoses:  Neck pain on right side    New Prescriptions New Prescriptions   CYCLOBENZAPRINE (FLEXERIL) 10 MG TABLET    Take 1 tablet (10 mg total) by mouth 3 (three) times daily as needed for muscle spasms.   PREDNISONE (DELTASONE) 20 MG TABLET    Take 3 po QD x 3d , then 2 po QD x 3d then 1 po QD x 3d    Plan discharge  Devoria Albe, MD, Concha Pyo, MD 04/10/16 [id number]

## 2016-04-10 NOTE — ED Notes (Signed)
Pt alert & oriented x4, stable gait. Patient  given discharge instructions, paperwork & prescription(s). Patient verbalized understanding. Pt left department w/ no further questions. 

## 2016-04-10 NOTE — ED Notes (Signed)
Pt went outside to see if his ride was still awake.

## 2016-04-10 NOTE — Telephone Encounter (Signed)
He may have a prescription for 16 Percocets. One every 6 hours as needed for severe pain. Caution drowsiness.Please remind the patient that I will not be prescribing pain medicines ongoing. This is to help him take the edge off the pain until he sees neurosurgery. If the neurosurgeon does not recommend surgery he will need to go to a pain management center.Previously this patient violated pain contract. Therefore I cannot ongoing prescribe.

## 2016-04-10 NOTE — Telephone Encounter (Signed)
Patient went to the ER today for neck pain.  He is out of his pain medication and wants to make an appointment for ER followup/refill medications.  Where would be the best place for [place] to work him in?

## 2016-04-10 NOTE — Discharge Instructions (Signed)
Use ice and heat on the areas that are painful. Take the medications as prescribed. He will need to discuss your pain medications with Dr. Gerda Diss, the ED does not supply you with pain medications when you run out. Keep her appointment with the neurosurgeon on August 4.  Chronic Pain Discharge Instructions  Emergency care providers appreciate that many patients coming to [place] are in severe pain and we wish to address their pain in the safest, most responsible manner.  It is important to recognize however, that the proper treatment of chronic pain differs from that of the pain of injuries and acute illnesses.  Our goal is to provide quality, safe, personalized care and we thank you for giving [place] the opportunity to serve you. The use of narcotics and related agents for chronic pain syndromes may lead to additional physical and psychological problems.  Nearly as many people die from prescription narcotics each year as die from car crashes.  Additionally, this risk is increased if such prescriptions are obtained from a variety of sources.  Therefore, only your primary care physician or a pain management specialist is able to safely treat such syndromes with narcotic medications long-term.    Documentation revealing such prescriptions have been sought from multiple sources may prohibit [place] from providing a refill or different narcotic medication.  Your name may be checked first through the [facility] Controlled Substances Reporting System.  This database is a record of controlled substance medication prescriptions that the patient has received.  This has been established by [facility] in an effort to eliminate the dangerous, and often life threatening, practice of obtaining multiple prescriptions from different medical providers.   If you have a chronic pain syndrome (i.e. chronic headaches, recurrent back or neck pain, dental pain, abdominal or pelvis pain without a specific diagnosis, or neuropathic pain  such as fibromyalgia) or recurrent visits for the same condition without an acute diagnosis, you may be treated with non-narcotics and other non-addictive medicines.  Allergic reactions or negative side effects that may be reported by a patient to such medications will not typically lead to the use of a narcotic analgesic or other controlled substance as an alternative.   Patients managing chronic pain with a personal physician should have provisions in place for breakthrough pain.  If you are in crisis, you should call your physician.  If your physician directs you to the emergency department, please have the doctor call and speak to our attending physician concerning your care.   When patients come to the Emergency Department (ED) with acute medical conditions in which the Emergency Department physician feels appropriate to prescribe narcotic or sedating pain medication, the physician will prescribe these in very limited quantities.  The amount of these medications will last only until you can see your primary care physician in his/her office.  Any patient who returns to the ED seeking refills should expect only non-narcotic pain medications.   In the event of an acute medical condition exists and the emergency physician feels it is necessary that the patient be given a narcotic or sedating medication -  a responsible adult driver should be present in the room prior to the medication being given by the nurse.   Prescriptions for narcotic or sedating medications that have been lost, stolen or expired will not be refilled in the Emergency Department.    Patients who have chronic pain may receive non-narcotic prescriptions until seen by their primary care physician.  It is every patient?s personal  responsibility to maintain active prescriptions with his or her primary care physician or specialist.

## 2016-04-10 NOTE — Telephone Encounter (Signed)
Spoke with patient and informed him per Dr.Scott Luking-  May have a prescription for 16 Percocets. One every 6 hours as needed for severe pain. Caution drowsiness.Dr.Scott will not be prescribing pain medicines ongoing. This is to help him take the edge off the pain until you see neurosurgery. If the neurosurgeon does not recommend surgery you will need to go to a pain management center.Previously this you violated pain contract. Therefore Dr.Scott cannot ongoing prescribe pain medications. Patient verbalized understanding. Prescription awaiting pick up.

## 2016-04-11 ENCOUNTER — Other Ambulatory Visit: Payer: Self-pay | Admitting: Family Medicine

## 2016-04-16 ENCOUNTER — Telehealth: Payer: Self-pay | Admitting: *Deleted

## 2016-04-16 NOTE — Telephone Encounter (Signed)
Patient called with c/o large boil-over one inch in diameter in his private area. Patient reports the area is extremely painful and the area has turned a black color. Patient states he has been crushing pain med and making a paste he has been putting on the area. Consult with Dr Brett Canales -advised patient to go to ER for evaluation and treatment. Patient verbalized understanding

## 2016-04-16 NOTE — Telephone Encounter (Signed)
ok 

## 2016-04-20 DIAGNOSIS — M5412 Radiculopathy, cervical region: Secondary | ICD-10-CM | POA: Insufficient documentation

## 2016-04-20 DIAGNOSIS — M47812 Spondylosis without myelopathy or radiculopathy, cervical region: Secondary | ICD-10-CM | POA: Insufficient documentation

## 2016-04-20 DIAGNOSIS — M4802 Spinal stenosis, cervical region: Secondary | ICD-10-CM | POA: Diagnosis not present

## 2016-04-20 DIAGNOSIS — M25511 Pain in right shoulder: Secondary | ICD-10-CM | POA: Diagnosis not present

## 2016-04-20 DIAGNOSIS — M4803 Spinal stenosis, cervicothoracic region: Secondary | ICD-10-CM | POA: Diagnosis not present

## 2016-04-20 DIAGNOSIS — M792 Neuralgia and neuritis, unspecified: Secondary | ICD-10-CM | POA: Diagnosis not present

## 2016-04-20 DIAGNOSIS — R202 Paresthesia of skin: Secondary | ICD-10-CM | POA: Diagnosis not present

## 2016-04-20 DIAGNOSIS — F17210 Nicotine dependence, cigarettes, uncomplicated: Secondary | ICD-10-CM | POA: Diagnosis not present

## 2016-04-20 DIAGNOSIS — M542 Cervicalgia: Secondary | ICD-10-CM | POA: Diagnosis not present

## 2016-04-22 ENCOUNTER — Encounter: Payer: Self-pay | Admitting: Family Medicine

## 2016-04-22 DIAGNOSIS — M792 Neuralgia and neuritis, unspecified: Secondary | ICD-10-CM | POA: Insufficient documentation

## 2016-04-23 ENCOUNTER — Encounter: Payer: Self-pay | Admitting: Family Medicine

## 2016-04-24 ENCOUNTER — Telehealth: Payer: Self-pay | Admitting: Family Medicine

## 2016-04-24 NOTE — Telephone Encounter (Signed)
Sent certified letter regarding patients' medication.  I sent it to the [facility] mail center to be stamped and sent out.

## 2016-04-27 ENCOUNTER — Ambulatory Visit (INDEPENDENT_AMBULATORY_CARE_PROVIDER_SITE_OTHER): Payer: Medicare Other | Admitting: Family Medicine

## 2016-04-27 ENCOUNTER — Encounter: Payer: Self-pay | Admitting: Family Medicine

## 2016-04-27 VITALS — BP 122/82 | Ht 74.0 in | Wt 179.0 lb

## 2016-04-27 DIAGNOSIS — M549 Dorsalgia, unspecified: Secondary | ICD-10-CM | POA: Diagnosis not present

## 2016-04-27 MED ORDER — TIZANIDINE HCL 4 MG PO TABS: 4.0000 mg | ORAL_TABLET | Freq: Three times a day (TID) | ORAL | 2 refills | Status: DC | PRN

## 2016-04-27 MED ORDER — TRAZODONE HCL 150 MG PO TABS: 150.0000 mg | ORAL_TABLET | Freq: Every day | ORAL | 6 refills | Status: DC

## 2016-04-27 NOTE — Progress Notes (Addendum)
   Subjective:    Patient ID: [name], male    DOB: 03-03-1971, 45 y.o.   MRN: [id number]  HPI  Patient arrives to discuss back pain. Patient states that being in pain has caused him to become depressed and he increase his trazodone to 2 pills at night instead of 1/2 to 1 tab at bedtime but is still not able to sleep. This patient has been [facility] a did evaluation they did not feel he needed surgery they are setting him up for nerve conduction studies and EMG he has severe back pain. We have stated to the patient that our office is not able at this point in time to manage him for chronic pain Review of Systems Patient with upper back right upper back pain paraspinal pain or rhomboid pain    Objective:   Physical Exam Patient was subjective back pain into the right scapular region he states is just severe pain lungs clear hearts regular tenderness in the rhomboid region and paraspinal region of the neck and upper back.       Assessment & Plan:  Patient with severe chronic pain-I believe patient would be better served by pain management center and they can try injections we have tried Cymbalta and Lyrica he did not tolerate either of these. He is currently on gabapentin and muscle relaxers. We will use Zanaflex as a muscle relaxer caution drowsiness trazodone to help with sleep at night. I have opted not to prescribe pain medications. I have advised this patient to be seen via pain management  Patient denies being suicidal

## 2016-04-30 ENCOUNTER — Telehealth: Payer: Self-pay | Admitting: Family Medicine

## 2016-04-30 NOTE — Addendum Note (Signed)
Addended by: Margaretha SheffieldBROWN, Russie Gulledge S on: 04/30/2016 08:24 AM   Modules accepted: Orders

## 2016-04-30 NOTE — Telephone Encounter (Signed)
We received receipt for merchandise for the certified letter dictated by Dr. Lorin PicketScott 04/23/16.  Jorja Loaim Dery signed this on 04/27/16.

## 2016-05-08 ENCOUNTER — Encounter: Payer: Self-pay | Admitting: Family Medicine

## 2016-06-12 ENCOUNTER — Ambulatory Visit: Payer: Medicare Other | Admitting: Family Medicine

## 2016-07-03 ENCOUNTER — Encounter: Payer: Self-pay | Admitting: Family Medicine

## 2016-07-30 ENCOUNTER — Encounter: Payer: Self-pay | Admitting: Family Medicine

## 2017-03-15 DIAGNOSIS — F17210 Nicotine dependence, cigarettes, uncomplicated: Secondary | ICD-10-CM | POA: Diagnosis not present

## 2017-03-15 DIAGNOSIS — Y939 Activity, unspecified: Secondary | ICD-10-CM | POA: Diagnosis not present

## 2017-03-15 DIAGNOSIS — S50362A Insect bite (nonvenomous) of left elbow, initial encounter: Secondary | ICD-10-CM | POA: Insufficient documentation

## 2017-03-15 DIAGNOSIS — Y999 Unspecified external cause status: Secondary | ICD-10-CM | POA: Insufficient documentation

## 2017-03-15 DIAGNOSIS — Y929 Unspecified place or not applicable: Secondary | ICD-10-CM | POA: Insufficient documentation

## 2017-03-15 DIAGNOSIS — W57XXXA Bitten or stung by nonvenomous insect and other nonvenomous arthropods, initial encounter: Secondary | ICD-10-CM | POA: Diagnosis not present

## 2017-03-16 ENCOUNTER — Emergency Department (HOSPITAL_COMMUNITY)
Admission: EM | Admit: 2017-03-16 | Discharge: 2017-03-16 | Disposition: A | Discharge status: Home / Self Care | Payer: Medicare Other | Attending: Emergency Medicine | Admitting: Emergency Medicine

## 2017-03-16 ENCOUNTER — Encounter (HOSPITAL_COMMUNITY): Payer: Self-pay | Admitting: *Deleted

## 2017-03-16 DIAGNOSIS — W57XXXA Bitten or stung by nonvenomous insect and other nonvenomous arthropods, initial encounter: Secondary | ICD-10-CM

## 2017-03-16 MED ORDER — DEXAMETHASONE 4 MG PO TABS: 4.0000 mg | ORAL_TABLET | Freq: Two times a day (BID) | ORAL | 0 refills | Status: DC

## 2017-03-16 MED ORDER — PREDNISONE 20 MG PO TABS
40.0000 mg | ORAL_TABLET | Freq: Once | ORAL | Status: AC
  Administered 2017-03-16: 40 mg via ORAL
  Filled 2017-03-16: qty 2

## 2017-03-16 MED ORDER — ONDANSETRON HCL 4 MG PO TABS
4.0000 mg | ORAL_TABLET | Freq: Once | ORAL | Status: AC
  Administered 2017-03-16: 4 mg via ORAL
  Filled 2017-03-16: qty 1

## 2017-03-16 NOTE — ED Provider Notes (Signed)
[facility] Provider Note   CSN: [id number] Arrival date & time: 03/15/17  2354     History   Chief Complaint Chief Complaint  Patient presents with  . Insect Bite    HPI [name] is a 46 y.o. male.  Patient is a 46 year old male who presents to the emergency department with a complaint of a bite to the left elbow.  The patient states that about 2 or 3 days ago he sustained a bite that he thinks maybe an insect bite to the left elbow area. He's been applying ice and taking Benadryl. He states he noted a small not and some mild redness at the area. He was concerned because it was his left elbow and it was near his heart. He denies any unusual shortness of breath or wheezing. No difficulty with breathing. No excessive swelling of the arm. He has been able to use his arm without any problem.   The history is provided by the patient.    Past Medical History:  Diagnosis Date  . Arthritis   . Chronic back pain   . History of degenerative disc disease   . History of spinal fusion     Patient Active Problem List   Diagnosis Date Noted  . Radicular pain in right arm 04/22/2016  . Hyperlipidemia 10/16/2013  . Allergic rhinitis 01/09/2013  . Chronic low back pain 12/17/2012    Past Surgical History:  Procedure Laterality Date  . 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 SPINAL FUSION         Home Medications    Prior to Admission medications   Medication Sig Start Date End Date Taking? Authorizing Provider  acetaminophen (TYLENOL) 325 MG tablet Take 1,300 mg by mouth every 6 (six) hours as needed for mild pain.    [provider]  ibuprofen (ADVIL,MOTRIN) 200 MG tablet Take 400-600 mg by mouth every 6 (six) hours as needed for mild pain.    [provider]  traZODone (DESYREL) 150 MG tablet Take 1 tablet (150 mg  total) by mouth at bedtime. 04/27/16   Babs SciaraLuking, Scott A, MD    Family History History reviewed. No pertinent family history.  Social History Social History  Substance Use Topics  . Smoking status: Current Every Day Smoker    Packs/day: 1.00    Types: Cigarettes  . Smokeless tobacco: Never Used  . Alcohol use No     Allergies   Toradol [ketorolac tromethamine]   Review of Systems Review of Systems  Constitutional: Negative for activity change.       All ROS Neg except as noted in HPI  HENT: Negative for nosebleeds.   Eyes: Negative for photophobia and discharge.  Respiratory: Negative for cough, shortness of breath and wheezing.   Cardiovascular: Negative for chest pain and palpitations.  Gastrointestinal: Negative for abdominal pain and blood in stool.  Genitourinary: Negative for dysuria, frequency and hematuria.  Musculoskeletal: Negative for arthralgias, back pain and neck pain.  Skin: Negative.   Neurological: Negative for dizziness, seizures and speech difficulty.  Psychiatric/Behavioral: Negative for confusion and hallucinations.     Physical Exam Updated Vital Signs BP 124/81 (BP Location: Right Arm)   Pulse 68   Temp 97.9 F (36.6 C) (Oral)   Resp 18   Ht 6\' 1"  (1.854 m)   Wt 81.6 kg (180 lb)  SpO2 94%   BMI 23.75 kg/m   Physical Exam  Constitutional: Vital signs are normal. He appears well-developed and well-nourished. He is active.  HENT:  Head: Normocephalic and atraumatic.  Right Ear: Tympanic membrane, external ear and ear canal normal.  Left Ear: Tympanic membrane, external ear and ear canal normal.  Nose: Nose normal.  Mouth/Throat: Uvula is midline, oropharynx is clear and moist and mucous membranes are normal.  Eyes: Conjunctivae, EOM and lids are normal. Pupils are equal, round, and reactive to light.  Neck: Trachea normal, normal range of motion and phonation normal. Neck supple. Carotid bruit is not present.  Cardiovascular: Normal rate,  regular rhythm and normal pulses.   Pulmonary/Chest: Effort normal. No respiratory distress. He has no wheezes. He has no rales.  Course breath sounds present, but no wheezing. There is symmetrical rise and fall of the chest. The patient speaks in complete sentences without problem.  Abdominal: Soft. Normal appearance and bowel sounds are normal.  Musculoskeletal: Normal range of motion.  There is full range of motion of the left shoulder. Is full range of motion of the left elbow. There is no evidence of an effusion. Just above the elbow there are 2 raised red areas. There is some mild redness around the raised red areas. The area is not hot to touch. There is full range of motion of the left wrist and fingers. Capillary refill is less than 2 seconds. There is no red streaking noted noted at all.  Lymphadenopathy:       Head (right side): No submental, no preauricular and no posterior auricular adenopathy present.       Head (left side): No submental, no preauricular and no posterior auricular adenopathy present.    He has no cervical adenopathy.  Neurological: He is alert. He has normal strength. No cranial nerve deficit or sensory deficit. GCS eye subscore is 4. GCS verbal subscore is 5. GCS motor subscore is 6.  Skin: Skin is warm and dry.  Psychiatric: His speech is normal.     ED Treatments / Results  Labs (all labs ordered are listed, but only abnormal results are displayed) Labs Reviewed - No data to display  EKG  EKG Interpretation None       Radiology No results found.  Procedures Procedures (including critical care time)  Medications Ordered in ED Medications - No data to display   Initial Impression / Assessment and Plan / ED Course  I have reviewed the triage vital signs and the nursing notes.  Pertinent labs & imaging results that were available during my care of the patient were reviewed by me and considered in my medical decision making (see chart for  details).       Final Clinical Impressions(s) / ED Diagnoses MdM Vital signs within normal limits. Pulse oximetry is 94% on room air. The patient has what appears to be insect bites on the left elbow area. There no red streaks, or signs of advancing infection. The patient has full range of motion of the left upper extremity. There is no temperature elevation on. No palpable nodes of the bicep tricep area.  I suspect that this is a local reaction to an insect bite. I've asked patient to continue his cool compress, and/or ice pack. I've asked him to be cautious when using the ice pack of his older nerve. The patient will apply Neosporin ointment. A prescription for Decadron given to the patient. He will use Claritin during the day for itching  if needed. He will use Benadryl at bedtime for itching. Patient is in agreement with this plan.    Final diagnoses:  Insect bite, initial encounter    New Prescriptions New Prescriptions   DEXAMETHASONE (DECADRON) 4 MG TABLET    Take 1 tablet (4 mg total) by mouth 2 (two) times daily with a meal.     Ivery Quale, PA-C 03/16/17 0041    Devoria Albe, MD 03/16/17 [id number]

## 2017-03-16 NOTE — ED Triage Notes (Signed)
Pt c/o red area to left elbow; pt states he was working on a car and thinks something may have bit him; pt states the area itches

## 2017-03-16 NOTE — Discharge Instructions (Signed)
Please use Claritin 10 mg each day, or Benadryl every 6 hours as needed for itching. Remember the Benadryl can cause drowsiness. May continue to apply cool compresses/ice pack to the area. Use Decadron 2 times daily with food. Please see your primary physician or return to the emergency department if any red streaks going up the arm, pus like drainage from the bite areas, high fevers, or changes in your general condition.

## 2017-04-09 ENCOUNTER — Emergency Department (HOSPITAL_COMMUNITY)
Admission: EM | Admit: 2017-04-09 | Discharge: 2017-04-09 | Disposition: A | Discharge status: Home / Self Care | Payer: Medicare Other | Attending: Emergency Medicine | Admitting: Emergency Medicine

## 2017-04-09 ENCOUNTER — Encounter (HOSPITAL_COMMUNITY): Payer: Self-pay | Admitting: Emergency Medicine

## 2017-04-09 DIAGNOSIS — Z79899 Other long term (current) drug therapy: Secondary | ICD-10-CM | POA: Diagnosis not present

## 2017-04-09 DIAGNOSIS — F17210 Nicotine dependence, cigarettes, uncomplicated: Secondary | ICD-10-CM | POA: Diagnosis not present

## 2017-04-09 DIAGNOSIS — H60322 Hemorrhagic otitis externa, left ear: Secondary | ICD-10-CM | POA: Insufficient documentation

## 2017-04-09 DIAGNOSIS — H9202 Otalgia, left ear: Secondary | ICD-10-CM | POA: Diagnosis present

## 2017-04-09 MED ORDER — AMOXICILLIN 500 MG PO CAPS: 500.0000 mg | ORAL_CAPSULE | Freq: Three times a day (TID) | ORAL | 0 refills | Status: AC

## 2017-04-09 MED ORDER — IBUPROFEN 800 MG PO TABS
800.0000 mg | ORAL_TABLET | Freq: Once | ORAL | Status: AC
  Administered 2017-04-09: 800 mg via ORAL
  Filled 2017-04-09: qty 1

## 2017-04-09 MED ORDER — AMOXICILLIN 250 MG PO CAPS
500.0000 mg | ORAL_CAPSULE | Freq: Once | ORAL | Status: AC
  Administered 2017-04-09: 500 mg via ORAL
  Filled 2017-04-09: qty 2

## 2017-04-09 MED ORDER — IBUPROFEN 800 MG PO TABS: 800.0000 mg | ORAL_TABLET | Freq: Three times a day (TID) | ORAL | 0 refills | Status: DC

## 2017-04-09 MED ORDER — NEOMYCIN-POLYMYXIN-HC 3.5-10000-1 OT SUSP
3.0000 [drp] | Freq: Four times a day (QID) | OTIC | Status: DC
  Administered 2017-04-09: 3 [drp] via OTIC
  Filled 2017-04-09: qty 10

## 2017-04-09 NOTE — ED Triage Notes (Signed)
Left ear pain, onset Sunday night, denies drainage

## 2017-04-09 NOTE — Discharge Instructions (Signed)
As discussed, you have an external ear infection.  Given the degree of pain and since you have swelling of your ear canal I cannot tell  if you also have a middle ear infection so are being treated with both ear drops and antibiotics pills.  Apply the 3 drops of the medicine given to your left ear 4 times daily, making sure to lie with that ear up for 5 minutes to make sure it gets absorbed. Take your next dose of the amoxil tomorrow morning.

## 2017-04-09 NOTE — ED Notes (Signed)
Pt alert & oriented x4, stable gait. Patient given discharge instructions, paperwork & prescription(s). Patient  instructed to stop at the registration desk to finish any additional paperwork. Patient verbalized understanding. Pt left department w/ no further questions. 

## 2017-04-11 NOTE — ED Provider Notes (Signed)
[facility] Provider Note   CSN: [id number] Arrival date & time: 04/09/17  1738     History   Chief Complaint Chief Complaint  Patient presents with  . Ear Fullness    HPI [name] is a 46 y.o. male presenting with a 2 day history of worsening left ear pain, describes sharp but also a constant pressure sensation in addition to decreased hearing acuity on this side.  He denies drainage from the ear and denies injury.  Pain radiates to his left jaw beneath the ear, denies dental pain, denies sore throat, fevers, chills, headaches or dizziness.  He has taken tylenol without relief of pain.  The history is provided by the patient.    Past Medical History:  Diagnosis Date  . Arthritis   . Chronic back pain   . History of degenerative disc disease   . History of spinal fusion     Patient Active Problem List   Diagnosis Date Noted  . Radicular pain in right arm 04/22/2016  . Hyperlipidemia 10/16/2013  . Allergic rhinitis 01/09/2013  . Chronic low back pain 12/17/2012    Past Surgical History:  Procedure Laterality Date  . 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 SPINAL FUSION         Home Medications    Prior to Admission medications   Medication Sig Start Date End Date Taking? Authorizing Provider  acetaminophen (TYLENOL) 325 MG tablet Take 1,300 mg by mouth every 6 (six) hours as needed for mild pain.    [provider]  amoxicillin (AMOXIL) 500 MG capsule Take 1 capsule (500 mg total) by mouth 3 (three) times daily. 04/09/17 04/19/17  Burgess AmorIdol, Lorine Iannaccone, PA-C  dexamethasone (DECADRON) 4 MG tablet Take 1 tablet (4 mg total) by mouth 2 (two) times daily with a meal. 03/16/17   Ivery QualeBryant, [place], PA-C  ibuprofen (ADVIL,MOTRIN) 800 MG tablet Take 1 tablet (800 mg total) by mouth 3 (three) times daily. 04/09/17   Burgess AmorIdol, Asyah Candler, PA-C    traZODone (DESYREL) 150 MG tablet Take 1 tablet (150 mg total) by mouth at bedtime. 04/27/16   Babs SciaraLuking, Scott A, MD    Family History No family history on file.  Social History Social History  Substance Use Topics  . Smoking status: Current Every Day Smoker    Packs/day: 1.00    Types: Cigarettes  . Smokeless tobacco: Never Used  . Alcohol use No     Allergies   Toradol [ketorolac tromethamine]   Review of Systems Review of Systems  Constitutional: Negative for chills and fever.  HENT: Positive for ear pain, hearing loss, rhinorrhea and sore throat. Negative for congestion, ear discharge, facial swelling, postnasal drip, sinus pressure, trouble swallowing and voice change.   Eyes: Negative for discharge.  Respiratory: Negative for cough, shortness of breath, wheezing and stridor.   Cardiovascular: Negative for chest pain.  Gastrointestinal: Negative for abdominal pain.  Genitourinary: Negative.   Neurological: Negative for dizziness.     Physical Exam Updated Vital Signs BP 119/76 (BP Location: Right Arm)   Pulse 67   Temp 99.3 F (37.4 C) (Temporal)   Resp 20   Ht 6\' 1"  (1.854 m)   Wt 81.6 kg (180 lb)   SpO2 97%   BMI 23.75 kg/m   Physical Exam  Constitutional: He is oriented to person, place, and time. He appears well-developed  and well-nourished.  HENT:  Head: Normocephalic and atraumatic.  Right Ear: Tympanic membrane and ear canal normal.  Left Ear: There is drainage, swelling and tenderness. No mastoid tenderness. Decreased hearing is noted.  Nose: Mucosal edema and rhinorrhea present.  Mouth/Throat: Uvula is midline, oropharynx is clear and moist and mucous membranes are normal. No oropharyngeal exudate, posterior oropharyngeal edema, posterior oropharyngeal erythema or tonsillar abscesses.  External canal edema without complete occlusion.  Serosanguinous dried drainage around external meatus. Pain with external traction.  Unable to visualize TM - pt not  able to tolerate speculum exam.   Eyes: Conjunctivae are normal.  Cardiovascular: Normal rate.   Pulmonary/Chest: Effort normal.  Musculoskeletal: Normal range of motion.  Neurological: He is alert and oriented to person, place, and time.  Skin: Skin is warm and dry. No rash noted.  Psychiatric: He has a normal mood and affect.  Vitals reviewed.    ED Treatments / Results  Labs (all labs ordered are listed, but only abnormal results are displayed) Labs Reviewed - No data to display  EKG  EKG Interpretation None       Radiology No results found.  Procedures Procedures (including critical care time)  Medications Ordered in ED Medications  amoxicillin (AMOXIL) capsule 500 mg (500 mg Oral Given 04/09/17 1933)  ibuprofen (ADVIL,MOTRIN) tablet 800 mg (800 mg Oral Given 04/09/17 2016)     Initial Impression / Assessment and Plan / ED Course  I have reviewed the triage vital signs and the nursing notes.  Pertinent labs & imaging results that were available during my care of the patient were reviewed by me and considered in my medical decision making (see chart for details).     Pt with external otitis, possibly otitis media, although unable to visualize TM,  Will cover for both, cortisporin given, first dose here and it absorbed well without need of wick, amoxil, ibuprofen. Close f/u with pcp for any worsened or persistent sx.  No external edema, doubt malignant otitis. No mastoid ttp.   The patient appears reasonably screened and/or stabilized for discharge and I doubt any other medical condition or other [facility] requiring further screening, evaluation, or treatment in the ED at this time prior to discharge.   Final Clinical Impressions(s) / ED Diagnoses   Final diagnoses:  Acute hemorrhagic otitis externa of left ear    New Prescriptions Discharge Medication List as of 04/09/2017  8:10 PM    START taking these medications   Details  amoxicillin (AMOXIL) 500 MG capsule  Take 1 capsule (500 mg total) by mouth 3 (three) times daily., Starting Tue 04/09/2017, Until Fri 04/19/2017, Print         Burgess Amordol, Briceson Broadwater, PA-C 04/11/17 1356    Burgess AmorIdol, Ashlea Dusing, PA-C 04/11/17 1356    Samuel JesterMcManus, Kathleen, DO 04/12/17 1519

## 2017-06-23 ENCOUNTER — Emergency Department (HOSPITAL_COMMUNITY)
Admission: EM | Admit: 2017-06-23 | Discharge: 2017-06-23 | Disposition: A | Discharge status: Home / Self Care | Payer: Medicare Other | Attending: Emergency Medicine | Admitting: Emergency Medicine

## 2017-06-23 ENCOUNTER — Encounter (HOSPITAL_COMMUNITY): Payer: Self-pay | Admitting: Emergency Medicine

## 2017-06-23 DIAGNOSIS — G8929 Other chronic pain: Secondary | ICD-10-CM | POA: Insufficient documentation

## 2017-06-23 DIAGNOSIS — M546 Pain in thoracic spine: Secondary | ICD-10-CM | POA: Insufficient documentation

## 2017-06-23 DIAGNOSIS — F17210 Nicotine dependence, cigarettes, uncomplicated: Secondary | ICD-10-CM | POA: Insufficient documentation

## 2017-06-23 DIAGNOSIS — X503XXA Overexertion from repetitive movements, initial encounter: Secondary | ICD-10-CM | POA: Diagnosis not present

## 2017-06-23 DIAGNOSIS — M549 Dorsalgia, unspecified: Secondary | ICD-10-CM | POA: Diagnosis present

## 2017-06-23 DIAGNOSIS — M545 Low back pain: Secondary | ICD-10-CM | POA: Diagnosis not present

## 2017-06-23 MED ORDER — TRAMADOL HCL 50 MG PO TABS: 50.0000 mg | ORAL_TABLET | Freq: Four times a day (QID) | ORAL | 0 refills | Status: DC | PRN

## 2017-06-23 MED ORDER — CYCLOBENZAPRINE HCL 10 MG PO TABS: 10.0000 mg | ORAL_TABLET | Freq: Three times a day (TID) | ORAL | 0 refills | Status: DC | PRN

## 2017-06-23 MED ORDER — PREDNISONE 10 MG PO TABS: ORAL_TABLET | ORAL | 0 refills | Status: DC

## 2017-06-23 NOTE — ED Provider Notes (Signed)
[facility] Provider Note   CSN: [id number] Arrival date & time: 06/23/17  1456     History   Chief Complaint Chief Complaint  Patient presents with  . Back Pain    HPI [name] is a 46 y.o. male.  HPI   [name] is a 46 y.o. male who presents to the Emergency Department complaining of mid and lower back pain.  Pain is recurrent and worse for several days.  Describes a throbbing pain from left lower and mid back that radiates to the left flank.  Pain worse with movement, improves at rest.  States he has been working more recently and believes that exacerbated his pain.  Denies chest pain, shortness of breath, vomiting, fever and abdominal pain.   Past Medical History:  Diagnosis Date  . Arthritis   . Chronic back pain   . History of degenerative disc disease   . History of spinal fusion     Patient Active Problem List   Diagnosis Date Noted  . Radicular pain in right arm 04/22/2016  . Hyperlipidemia 10/16/2013  . Allergic rhinitis 01/09/2013  . Chronic low back pain 12/17/2012    Past Surgical History:  Procedure Laterality Date  . 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 SPINAL FUSION         Home Medications    Prior to Admission medications   Medication Sig Start Date End Date Taking? Authorizing Provider  acetaminophen (TYLENOL) 325 MG tablet Take 1,300 mg by mouth every 6 (six) hours as needed for mild pain.    [provider]  dexamethasone (DECADRON) 4 MG tablet Take 1 tablet (4 mg total) by mouth 2 (two) times daily with a meal. 03/16/17   Ivery Quale, PA-C  ibuprofen (ADVIL,MOTRIN) 800 MG tablet Take 1 tablet (800 mg total) by mouth 3 (three) times daily. 04/09/17   Burgess Amor, PA-C  traZODone (DESYREL) 150 MG tablet Take 1 tablet (150 mg total) by mouth at bedtime. 04/27/16   Babs Sciara,  MD    Family History No family history on file.  Social History Social History  Substance Use Topics  . Smoking status: Current Every Day Smoker    Packs/day: 1.00    Types: Cigarettes  . Smokeless tobacco: Never Used  . Alcohol use No     Allergies   Toradol [ketorolac tromethamine]   Review of Systems Review of Systems  Constitutional: Negative for fever.  Respiratory: Negative for shortness of breath.   Gastrointestinal: Negative for abdominal pain, constipation and vomiting.  Genitourinary: Negative for decreased urine volume, difficulty urinating, dysuria, flank pain and hematuria.  Musculoskeletal: Positive for back pain. Negative for joint swelling.  Skin: Negative for rash.  Neurological: Negative for weakness and numbness.  All other systems reviewed and are negative.    Physical Exam Updated Vital Signs BP 116/78 (BP Location: Right Arm)   Pulse 71   Temp 97.9 F (36.6 C) (Oral)   Resp 16   Wt 81.6 kg (180 lb)   SpO2 95%   BMI 23.75 kg/m   Physical Exam  Constitutional: He is oriented to person, place, and time. He appears well-developed and well-nourished. No distress.  HENT:  Head: Normocephalic and atraumatic.  Neck: Normal range of motion. Neck supple.  Cardiovascular: Normal rate, regular rhythm, normal heart sounds and intact distal pulses.  No murmur heard. Pulmonary/Chest: Effort normal and breath sounds normal. No respiratory distress. He exhibits no tenderness.  Abdominal: Soft. He exhibits no distension. There is no tenderness. There is no guarding.  Musculoskeletal: He exhibits tenderness. He exhibits no edema.       Lumbar back: He exhibits tenderness and pain. He exhibits normal range of motion, no swelling, no deformity, no laceration and normal pulse.  ttp of the left lumbar and thoracic paraspinal muscles.  No spinal tenderness.  DP pulses are brisk and symmetrical.  Distal sensation intact.  Pt has 5/5 strength against resistance  of bilateral lower extremities.     Neurological: He is alert and oriented to person, place, and time. He has normal strength. No sensory deficit. He exhibits normal muscle tone. Coordination and gait normal.  Reflex Scores:      Patellar reflexes are 2+ on the right side and 2+ on the left side.      Achilles reflexes are 2+ on the right side and 2+ on the left side. Skin: Skin is warm and dry. Capillary refill takes less than 2 seconds. No rash noted.  Nursing note and vitals reviewed.    ED Treatments / Results  Labs (all labs ordered are listed, but only abnormal results are displayed) Labs Reviewed - No data to display  EKG  EKG Interpretation None       Radiology No results found.  Procedures Procedures (including critical care time)  Medications Ordered in ED Medications - No data to display   Initial Impression / Assessment and Plan / ED Course  I have reviewed the triage vital signs and the nursing notes.  Pertinent labs & imaging results that were available during my care of the patient were reviewed by me and considered in my medical decision making (see chart for details).     Pt is ambulatory, gait steady.  No focal neuro deficits. No CP, shortness of breath. No concerning sx's for cauda equina, coronary process or spinal abscess.    XR offered, but pt declined, agrees to ER return if not improving or PCP follow-up this week  Final Clinical Impressions(s) / ED Diagnoses   Final diagnoses:  Chronic left-sided low back pain without sciatica  Chronic left-sided thoracic back pain    New Prescriptions New Prescriptions   No medications on file     Pauline Aus, Cordelia Poche 06/26/17 2259    Donnetta Hutching, MD 06/28/17 1050

## 2017-06-23 NOTE — ED Triage Notes (Signed)
Pt states that he has been experiencing upper back pain for three days starts in the right upper back and wraps around the front to upper abdomen.  Pt states that he has been working on cars, sweeping, and mopping.  Pt denies nausea, vomiting, fevers, and chills.

## 2017-06-23 NOTE — Discharge Instructions (Signed)
Alternate ice and heat to your back.  Follow-up with your primary doctor this week for recheck.  Return to ER for any worsening symptoms such as chest pain, shortness of breath

## 2017-06-23 NOTE — ED Notes (Signed)
Pt reports previous back problems, has been lifting, twisting, and bending. L side back pain began 4 days ago, no improvement with tylenol or motrin at home. Deneis bowel/bladder dysfunction, new numbness or weakness in legs.

## 2017-07-30 ENCOUNTER — Other Ambulatory Visit: Payer: Self-pay

## 2017-07-30 ENCOUNTER — Emergency Department (HOSPITAL_COMMUNITY)
Admission: EM | Admit: 2017-07-30 | Discharge: 2017-07-31 | Disposition: A | Discharge status: Home / Self Care | Payer: Medicare Other | Attending: Emergency Medicine | Admitting: Emergency Medicine

## 2017-07-30 ENCOUNTER — Encounter (HOSPITAL_COMMUNITY): Payer: Self-pay | Admitting: Emergency Medicine

## 2017-07-30 ENCOUNTER — Emergency Department (HOSPITAL_COMMUNITY): Payer: Medicare Other

## 2017-07-30 DIAGNOSIS — Y929 Unspecified place or not applicable: Secondary | ICD-10-CM | POA: Diagnosis not present

## 2017-07-30 DIAGNOSIS — S62364A Nondisplaced fracture of neck of fourth metacarpal bone, right hand, initial encounter for closed fracture: Secondary | ICD-10-CM | POA: Diagnosis not present

## 2017-07-30 DIAGNOSIS — Y939 Activity, unspecified: Secondary | ICD-10-CM | POA: Diagnosis not present

## 2017-07-30 DIAGNOSIS — W231XXA Caught, crushed, jammed, or pinched between stationary objects, initial encounter: Secondary | ICD-10-CM | POA: Insufficient documentation

## 2017-07-30 DIAGNOSIS — S62324A Displaced fracture of shaft of fourth metacarpal bone, right hand, initial encounter for closed fracture: Secondary | ICD-10-CM | POA: Diagnosis not present

## 2017-07-30 DIAGNOSIS — F17210 Nicotine dependence, cigarettes, uncomplicated: Secondary | ICD-10-CM | POA: Insufficient documentation

## 2017-07-30 DIAGNOSIS — Z79899 Other long term (current) drug therapy: Secondary | ICD-10-CM | POA: Diagnosis not present

## 2017-07-30 DIAGNOSIS — Y999 Unspecified external cause status: Secondary | ICD-10-CM | POA: Insufficient documentation

## 2017-07-30 DIAGNOSIS — S6991XA Unspecified injury of right wrist, hand and finger(s), initial encounter: Secondary | ICD-10-CM | POA: Diagnosis present

## 2017-07-30 NOTE — ED Triage Notes (Signed)
Pt c/o right hand pain after getting it slammed in door 2 days ago.

## 2017-07-31 MED ORDER — HYDROCODONE-ACETAMINOPHEN 5-325 MG PO TABS
1.0000 | ORAL_TABLET | Freq: Once | ORAL | Status: AC
  Administered 2017-07-31: 1 via ORAL
  Filled 2017-07-31: qty 1

## 2017-07-31 MED ORDER — HYDROCODONE-ACETAMINOPHEN 5-325 MG PO TABS: 1.0000 | ORAL_TABLET | ORAL | 0 refills | Status: DC | PRN

## 2017-07-31 NOTE — ED Provider Notes (Signed)
[facility] Provider Note   CSN: [id number] Arrival date & time: 07/30/17  2304     History   Chief Complaint Chief Complaint  Patient presents with  . Hand Pain    HPI [name] is a 46 y.o. male.  The history is provided by the patient.  Hand Pain  This is a new problem. The current episode started 2 days ago (accidental slam in a door). The problem occurs constantly. The problem has been gradually worsening. Exacerbated by: movement and palpation. Nothing relieves the symptoms. He has tried acetaminophen (nsaids, rest) for the symptoms. The treatment provided no relief.    Past Medical History:  Diagnosis Date  . Arthritis   . Chronic back pain   . History of degenerative disc disease   . History of spinal fusion     Patient Active Problem List   Diagnosis Date Noted  . Radicular pain in right arm 04/22/2016  . Hyperlipidemia 10/16/2013  . Allergic rhinitis 01/09/2013  . Chronic low back pain 12/17/2012    Past Surgical History:  Procedure Laterality Date  . SPINAL FUSION         Home Medications    Prior to Admission medications   Medication Sig Start Date End Date Taking? Authorizing Provider  acetaminophen (TYLENOL) 325 MG tablet Take 1,300 mg by mouth every 6 (six) hours as needed for mild pain.    [provider]  cyclobenzaprine (FLEXERIL) 10 MG tablet Take 1 tablet (10 mg total) by mouth 3 (three) times daily as needed. 06/23/17   Triplett, Tammy, PA-C  dexamethasone (DECADRON) 4 MG tablet Take 1 tablet (4 mg total) by mouth 2 (two) times daily with a meal. 03/16/17   Ivery QualeBryant, Hobson, PA-C  HYDROcodone-acetaminophen (NORCO/VICODIN) 5-325 MG tablet Take 1 tablet every 4 (four) hours as needed by mouth. 07/31/17   Chari Parmenter, Raynelle FanningJulie, PA-C  ibuprofen (ADVIL,MOTRIN) 800 MG tablet Take 1 tablet (800 mg total) by mouth 3 (three) times daily. 04/09/17   Mehgan Santmyer, Raynelle FanningJulie, PA-C  predniSONE (DELTASONE) 10 MG tablet Take 6 tablets day one,  5 tablets day two, 4 tablets day three, 3 tablets day four, 2 tablets day five, then 1 tablet day six 06/23/17   Triplett, Tammy, PA-C  traMADol (ULTRAM) 50 MG tablet Take 1 tablet (50 mg total) by mouth every 6 (six) hours as needed. 06/23/17   Triplett, Tammy, PA-C  traZODone (DESYREL) 150 MG tablet Take 1 tablet (150 mg total) by mouth at bedtime. 04/27/16   Babs SciaraLuking, Scott A, MD    Family History History reviewed. No pertinent family history.  Social History Social History   Tobacco Use  . Smoking status: Current Every Day Smoker    Packs/day: 1.00    Types: Cigarettes  . Smokeless tobacco: Never Used  Substance Use Topics  . Alcohol use: No  . Drug use: No     Allergies   Toradol [ketorolac tromethamine]   Review of Systems Review of Systems  Constitutional: Negative for fever.  Musculoskeletal: Positive for arthralgias and joint swelling. Negative for myalgias.  Neurological: Negative for weakness and numbness.     Physical Exam Updated Vital Signs BP 126/84   Pulse 65   Temp 98 F (36.7 C) (Oral)   Resp 18   Ht 6\' 1"  (1.854 m)   Wt 81.6 kg (180 lb)   SpO2 96%   BMI 23.75 kg/m   Physical Exam  Constitutional: He appears well-developed and well-nourished.  HENT:  Head:  Atraumatic.  Neck: Normal range of motion.  Cardiovascular:  Pulses equal bilaterally  Musculoskeletal: He exhibits tenderness.       Hands: Moderate edema dorsal right hand, most significant at the third and fourth metacarpals.  Distal sensation is intact with less than 2-second cap refill.  No wrist pain.  Skin intact.  Patient has no pain at his right index finger.  Neurological: He is alert. He has normal strength. He displays normal reflexes. No sensory deficit.  Skin: Skin is warm and dry.  Psychiatric: He has a normal mood and affect.     ED Treatments / Results  Labs (all labs ordered are listed, but only abnormal results are displayed) Labs Reviewed - No data to  display  EKG  EKG Interpretation None       Radiology Dg Hand Complete Right  Result Date: 07/30/2017 CLINICAL DATA:  46 year old male with trauma to the right hand. EXAM: RIGHT HAND - COMPLETE 3+ VIEW COMPARISON:  None. FINDINGS: There is a fracture of the head of the fourth metacarpal with mild volar angulation. There is a focal area of irregularity along the radial cortex of the proximal phalanx of the index finger at the PIP joint with a small adjacent bone fragment. This is felt to represent chronic changes. Correlation with point tenderness over this area recommended. No other acute fracture identified. There is no dislocation. There is soft tissue swelling over the dorsum of the hand at the MCP joints. No radiopaque foreign object. IMPRESSION: 1. Minimally angulated acute fracture of the head of the fourth metacarpal. 2. Chronic changes versus less likely cortical avulsion fracture of the radial aspect of the proximal phalanx of the index finger at the PIP joint. Correlation with point tenderness over this area recommended. 3. Soft tissue swelling over the MCP joints. No radiopaque foreign object. Electronically Signed   By: Elgie CollardArash  Radparvar M.D.   On: 07/30/2017 23:57    Procedures Procedures (including critical care time)  SPLINT APPLICATION Date/Time: 12:59 AM Authorized by: Burgess AmorIDOL, Suhana Wilner Consent: Verbal consent obtained. Risks and benefits: risks, benefits and alternatives were discussed Consent given by: patient Splint applied by: RN Location details: right hand Splint type: ulnar gutter Supplies used: fiberglass, ace, sling, webril. Post-procedure: The splinted body part was neurovascularly unchanged following the procedure. Patient tolerance: Patient tolerated the procedure well with no immediate complications.     Medications Ordered in ED Medications  HYDROcodone-acetaminophen (NORCO/VICODIN) 5-325 MG per tablet 1 tablet (not administered)     Initial Impression  / Assessment and Plan / ED Course  I have reviewed the triage vital signs and the nursing notes.  Pertinent labs & imaging results that were available during my care of the patient were reviewed by me and considered in my medical decision making (see chart for details).     Patient was placed in an ulnar gutter splint, sling provided.  Discussed follow-up care with referrals given.  Hydrocodone.    Final Clinical Impressions(s) / ED Diagnoses   Final diagnoses:  Closed nondisplaced fracture of neck of fourth metacarpal bone of right hand, initial encounter    ED Discharge Orders        Ordered    HYDROcodone-acetaminophen (NORCO/VICODIN) 5-325 MG tablet  Every 4 hours PRN     07/31/17 0059       Burgess Amordol, Lindamarie Maclachlan, PA-C 07/31/17 0059    Zadie RhineWickline, Donald, MD 07/31/17 [phone]

## 2017-07-31 NOTE — Discharge Instructions (Signed)
You may take the hydrocodone prescribed for pain relief.  This will make you drowsy - do not drive within 4 hours of taking this medication. ° °

## 2017-07-31 NOTE — ED Notes (Signed)
Pt alert & oriented x4, stable gait. Patient given discharge instructions, paperwork & prescription(s). Patient informed not to drive, operate any equipment & handel any important documents 4 hours after taking pain medication. Patient  instructed to stop at the registration desk to finish any additional paperwork. Patient  verbalized understanding. Pt left department w/ no further questions. 

## 2017-08-05 ENCOUNTER — Ambulatory Visit (INDEPENDENT_AMBULATORY_CARE_PROVIDER_SITE_OTHER): Payer: Medicare Other | Admitting: Orthopedic Surgery

## 2017-08-05 VITALS — BP 132/89 | HR 58 | Ht 72.0 in | Wt 175.0 lb

## 2017-08-05 DIAGNOSIS — S62304A Unspecified fracture of fourth metacarpal bone, right hand, initial encounter for closed fracture: Secondary | ICD-10-CM

## 2017-08-05 MED ORDER — HYDROCODONE-ACETAMINOPHEN 5-325 MG PO TABS: 1.0000 | ORAL_TABLET | ORAL | 0 refills | Status: DC | PRN

## 2017-08-05 NOTE — Progress Notes (Signed)
  NEW PATIENT OFFICE VISIT    Chief Complaint  Patient presents with  . Follow-up    ER recheck on right hand fracture DOI 07-29-17.    46 year old male right hand closed in a door presents for evaluation and treatment of fracture fourth metacarpal head  Pain right hand mild dull constant swelling skin ecchymotic    Review of Systems  Constitutional: Negative for chills and fever.  Skin: Negative.   Neurological: Negative for tingling.     Past Medical History:  Diagnosis Date  . Arthritis   . Chronic back pain   . History of degenerative disc disease   . History of spinal fusion     Past Surgical History:  Procedure Laterality Date  . 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 SPINAL FUSION      No family history on file. Social History   Tobacco Use  . Smoking status: Current Every Day Smoker    Packs/day: 1.00    Types: Cigarettes  . Smokeless tobacco: Never Used  Substance Use Topics  . Alcohol use: No  . Drug use: No     No outpatient medications have been marked as taking for the 08/05/17 encounter (Appointment) with Vickki HearingHarrison, Stanley E, MD.    BP 132/89   Pulse (!) 58   Ht 6' (1.829 m)   Wt 175 lb (79.4 kg)   BMI 23.73 kg/m   Physical Exam  Constitutional: He is oriented to person, place, and time. He appears well-developed and well-nourished.  Neurological: He is alert and oriented to person, place, and time.  Psychiatric: He has a normal mood and affect.    Ortho Exam   The left hand was used for comparison to check rotation.Normal alignment left hand normal alignment right hand. Passive range of motion decreased at the metacarpophalangeal joint of the ring finger tenderness at the ring finger M CP joint and there is some tenderness at the PIP joint of the index finger see x-ray report all joints stable motor exam normal there is no.  Skin is ecchymotic but intact without rash or  laceration sensation remains normal pulse and perfusion are normal epitrochlear lymph nodes are negative  Meds ordered this encounter  Medications  . HYDROcodone-acetaminophen (NORCO/VICODIN) 5-325 MG tablet    Sig: Take 1 tablet every 4 (four) hours as needed by mouth.    Dispense:  30 tablet    Refill:  0    Encounter Diagnosis  Name Primary?  . Closed displaced fracture of fourth metacarpal bone of right hand, initial encounter Yes  Additional diagnosis PIP joint avulsion fracture, functionally a PIP joint sprain   PLAN:   Interpretation of image.  Right hand x-ray  Metacarpal head fracture nondisplaced fourth metacarpal.  No significant angulation or displacement is seen.  There is a old injury to the PIP joint of the right index finger.  Encounter Diagnosis  Name Primary?  . Closed displaced fracture of fourth metacarpal bone of right hand, initial encounter Yes    Plan: [facility] [place] splint x-ray 4 weeks

## 2017-08-05 NOTE — Patient Instructions (Addendum)
Metacarpal Fracture A metacarpal fracture is a break (fracture) of a bone in the hand. Metacarpals are the bones that go from your knuckles to your wrist. You have five metacarpal bones in each hand. Depending on the break, your break may be treated with only a cast or a splint. Or, you may need surgery. Follow these instructions at home: If you have a cast:  Do not stick anything inside the cast to scratch your skin.  Check the skin around the cast every day. Tell your doctor about any concerns. You may put lotion on dry skin around the edges of the cast. Do not put lotion on the skin under the cast. If you have a splint:  Wear it as told by your doctor. Remove it only as told by your doctor.  Loosen the splint if your fingers get numb and tingle, or if they turn cold and blue. Bathing  Cover the cast or splint with a watertight plastic bag to protect it from water while you take a bath or a shower. Do not let the cast or splint get wet. Managing pain, stiffness, and swelling  If directed, put ice on the injured area (if you have a splint, not a cast): ? Put ice in a plastic bag. ? Place a towel between your skin and the bag. ? Leave the ice on for 20 minutes, 2-3 times a day.  Move your fingers often. This helps with stiffness and swelling.  Raise the injured area above the level of your heart while you are sitting or lying down. Driving  Do not drive or use heavy machinery while taking pain medicine.  Do not drive while wearing a cast or splint on a hand that you use for driving. Activity  Return to your normal activities as told by your doctor. Ask your doctor what activities are safe for you. General instructions  Do not put pressure on any part of the cast or splint until it is fully hardened. This may take several hours.  Keep the cast or splint clean and dry.  Do not use any tobacco products. These include cigarettes, chewing tobacco, or electronic cigarettes. Tobacco  can delay bone healing. If you need help quitting, ask your doctor.  Take medicines only as told by your doctor.  Keep all follow-up visits as told by your doctor. This is important. Contact a doctor if:  Your pain is worse.  You have redness, swelling, or pain in the injured area.  You have fluid, blood, or pus coming from under your cast or splint.  There is a bad smell coming from under your cast or splint.  You have a fever. Get help right away if:  You get a rash.  You have trouble breathing.  Your skin or nails on your injured hand turn blue or gray even after you loosen your splint.  Your injured hand feels cold or gets numb even after you loosen your splint.  You have very bad pain under the cast or in your hand. This information is not intended to replace advice given to you by your health care provider. Make sure you discuss any questions you have with your health care provider. Document Released: 02/20/2008 Document Revised: 02/09/2016 Document Reviewed: 06/23/2014 Elsevier Interactive Patient Education  2018 [employer]

## 2017-08-12 ENCOUNTER — Telehealth: Payer: Self-pay | Admitting: Orthopedic Surgery

## 2017-08-12 NOTE — Telephone Encounter (Signed)
Hydrocodone-Acetaminophen  5/325 mg  Qty 30 tablets  Take 1 tablet every 4 (four) hours as needed by mouth.

## 2017-08-13 ENCOUNTER — Other Ambulatory Visit: Payer: Self-pay | Admitting: Orthopedic Surgery

## 2017-08-13 DIAGNOSIS — S62304A Unspecified fracture of fourth metacarpal bone, right hand, initial encounter for closed fracture: Secondary | ICD-10-CM

## 2017-08-13 MED ORDER — HYDROCODONE-ACETAMINOPHEN 5-325 MG PO TABS: 1.0000 | ORAL_TABLET | Freq: Four times a day (QID) | ORAL | 0 refills | Status: DC | PRN

## 2017-08-13 NOTE — Telephone Encounter (Signed)
Ready this am tues

## 2017-08-17 ENCOUNTER — Encounter (HOSPITAL_COMMUNITY): Payer: Self-pay | Admitting: *Deleted

## 2017-08-17 ENCOUNTER — Emergency Department (HOSPITAL_COMMUNITY)
Admission: EM | Admit: 2017-08-17 | Discharge: 2017-08-17 | Disposition: A | Discharge status: Home / Self Care | Payer: Medicare Other | Attending: Emergency Medicine | Admitting: Emergency Medicine

## 2017-08-17 ENCOUNTER — Emergency Department (HOSPITAL_COMMUNITY): Payer: Medicare Other

## 2017-08-17 DIAGNOSIS — X58XXXD Exposure to other specified factors, subsequent encounter: Secondary | ICD-10-CM | POA: Insufficient documentation

## 2017-08-17 DIAGNOSIS — F17210 Nicotine dependence, cigarettes, uncomplicated: Secondary | ICD-10-CM | POA: Diagnosis not present

## 2017-08-17 DIAGNOSIS — Z79899 Other long term (current) drug therapy: Secondary | ICD-10-CM | POA: Insufficient documentation

## 2017-08-17 DIAGNOSIS — Z89422 Acquired absence of other left toe(s): Secondary | ICD-10-CM | POA: Insufficient documentation

## 2017-08-17 DIAGNOSIS — S62304D Unspecified fracture of fourth metacarpal bone, right hand, subsequent encounter for fracture with routine healing: Secondary | ICD-10-CM | POA: Insufficient documentation

## 2017-08-17 DIAGNOSIS — M79641 Pain in right hand: Secondary | ICD-10-CM | POA: Diagnosis not present

## 2017-08-17 DIAGNOSIS — S6991XA Unspecified injury of right wrist, hand and finger(s), initial encounter: Secondary | ICD-10-CM | POA: Diagnosis not present

## 2017-08-17 MED ORDER — HYDROCODONE-ACETAMINOPHEN 5-325 MG PO TABS
1.0000 | ORAL_TABLET | Freq: Once | ORAL | Status: AC
  Administered 2017-08-17: 1 via ORAL
  Filled 2017-08-17: qty 1

## 2017-08-17 NOTE — ED Provider Notes (Signed)
Medical screening examination/treatment/procedure(s) were conducted as a shared visit with non-physician practitioner(s) and myself.  I personally evaluated the patient during the encounter.   EKG Interpretation None       Results for orders placed or performed in visit on 07/04/15  ToxASSURE Select 13 (MW), Urine  Result Value Ref Range   ToxAssure Select 13 FINAL    PDF .    Dg Hand Complete Right  Result Date: 08/17/2017 CLINICAL DATA:  Right hand shut in door. Pain at fourth metacarpal. Initial encounter. EXAM: RIGHT HAND - COMPLETE 3+ VIEW COMPARISON:  07/30/2017 FINDINGS: Minimally displaced fracture of the fourth metacarpal head is again seen with intra-articular extension into the MCP joint. This is stable in appearance since previous study. No new fractures are identified. No evidence of dislocation. IMPRESSION: Stable appearance of distal fourth metacarpal fracture. No new findings. Electronically Signed   By: Myles RosenthalJohn  Stahl M.D.   On: 08/17/2017 19:53   Dg Hand Complete Right  Result Date: 07/30/2017 CLINICAL DATA:  46 year old male with trauma to the right hand. EXAM: RIGHT HAND - COMPLETE 3+ VIEW COMPARISON:  None. FINDINGS: There is a fracture of the head of the fourth metacarpal with mild volar angulation. There is a focal area of irregularity along the radial cortex of the proximal phalanx of the index finger at the PIP joint with a small adjacent bone fragment. This is felt to represent chronic changes. Correlation with point tenderness over this area recommended. No other acute fracture identified. There is no dislocation. There is soft tissue swelling over the dorsum of the hand at the MCP joints. No radiopaque foreign object. IMPRESSION: 1. Minimally angulated acute fracture of the head of the fourth metacarpal. 2. Chronic changes versus less likely cortical avulsion fracture of the radial aspect of the proximal phalanx of the index finger at the PIP joint. Correlation with  point tenderness over this area recommended. 3. Soft tissue swelling over the MCP joints. No radiopaque foreign object. Electronically Signed   By: Elgie CollardArash  Radparvar M.D.   On: 07/30/2017 23:57    X-rays reviewed.  Report above.  Patient seen by me along with physician assistant.  Patient with injury to his right hand fourth metacarpal about 3 weeks ago.  Being followed by hand surgery.  Has a specialized brace for the hand.  Patient does not like it he says it makes it hurt more.  X-rays here today without any without worsening findings.  Could consider go ahead and do a formal splint and have him follow back up with orthopedics.  Patient being followed by Dr. Romeo AppleHarrison.  Examination of the hand good cap refill to all fingers.  No significant swelling no erythema.  Radial pulses 2+.  Reasonable range of motion.  Some tenderness to palpation to the distal fourth metacarpal.   Vanetta MuldersZackowski, Porfiria Heinrich, MD 08/17/17 2008

## 2017-08-17 NOTE — ED Triage Notes (Signed)
Pt had a door slammed right hand 3 weeks ago and pt states getting worse. Pt has been seen by Romeo AppleHarrison.

## 2017-08-17 NOTE — ED Provider Notes (Signed)
[facility] EMERGENCY DEPARTMENT Provider Note   CSN: [id number] Arrival date & time: 08/17/17  4098     History   Chief Complaint Chief Complaint  Patient presents with  . Hand Pain    HPI [name] is a 46 y.o. male presenting with right hand pain.  Patient states that 3 weeks ago he had fracture of his right hand.  He has been following up with Dr. Romeo Apple.  He feels the brace that he has gotten is making his pain worse.  He has contacted Dr. Romeo Apple about refill of pain medication, but has not seen Dr. Romeo Apple since the original injury.  He states his pain is constant and throbbing, worse with movement of his hand.  He is taking pain medicine, but states that this is not helping.  Movement and palpation makes the pain worse, nothing has made it better.  He denies numbness or tingling.  He denies new fall, trauma, or injury. He denies pain elsewhere.  He denies fevers, chills, spreading redness up his arm, or increased swelling.   HPI  Past Medical History:  Diagnosis Date  . Arthritis   . Chronic back pain   . History of degenerative disc disease   . History of spinal fusion     Patient Active Problem List   Diagnosis Date Noted  . Radicular pain in right arm 04/22/2016  . Hyperlipidemia 10/16/2013  . Allergic rhinitis 01/09/2013  . Chronic low back pain 12/17/2012    Past Surgical History:  Procedure Laterality Date  . 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 SPINAL FUSION         Home Medications    Prior to Admission medications   Medication Sig Start Date End Date Taking? Authorizing Provider  acetaminophen (TYLENOL) 325 MG tablet Take 1,300 mg by mouth every 6 (six) hours as needed for mild pain.    [provider]  cyclobenzaprine (FLEXERIL) 10 MG tablet Take 1 tablet (10 mg total) by mouth 3 (three) times  daily as needed. 06/23/17   Triplett, Tammy, PA-C  dexamethasone (DECADRON) 4 MG tablet Take 1 tablet (4 mg total) by mouth 2 (two) times daily with a meal. 03/16/17   Ivery Quale, PA-C  HYDROcodone-acetaminophen (NORCO/VICODIN) 5-325 MG tablet Take 1 tablet by mouth every 6 (six) hours as needed. 08/13/17   Vickki Hearing, MD  ibuprofen (ADVIL,MOTRIN) 800 MG tablet Take 1 tablet (800 mg total) by mouth 3 (three) times daily. 04/09/17   Idol, Raynelle Fanning, PA-C  predniSONE (DELTASONE) 10 MG tablet Take 6 tablets day one, 5 tablets day two, 4 tablets day three, 3 tablets day four, 2 tablets day five, then 1 tablet day six 06/23/17   Triplett, Tammy, PA-C  traMADol (ULTRAM) 50 MG tablet Take 1 tablet (50 mg total) by mouth every 6 (six) hours as needed. 06/23/17   Triplett, Tammy, PA-C  traZODone (DESYREL) 150 MG tablet Take 1 tablet (150 mg total) by mouth at bedtime. 04/27/16   Babs Sciara, MD    Family History History reviewed. No pertinent family history.  Social History Social History   Tobacco Use  . Smoking status: Current Every Day Smoker    Packs/day: 1.00    Types: Cigarettes  . Smokeless tobacco: Never Used  Substance Use Topics  . Alcohol use: No  . Drug use: No  Allergies   Toradol [ketorolac tromethamine]   Review of Systems Review of Systems  Musculoskeletal: Positive for arthralgias.  Skin: Negative for wound.  Neurological: Negative for numbness.     Physical Exam Updated Vital Signs BP 131/65 (BP Location: Left Arm)   Pulse 83   Temp 98.2 F (36.8 C) (Oral)   Resp 16   Ht 6' (1.829 m)   Wt 79.4 kg (175 lb)   SpO2 96%   BMI 23.73 kg/m   Physical Exam  Constitutional: He is oriented to person, place, and time. He appears well-developed and well-nourished. No distress.  HENT:  Head: Normocephalic and atraumatic.  Eyes: EOM are normal.  Neck: Normal range of motion.  Pulmonary/Chest: Effort normal.  Abdominal: He exhibits no distension.    Musculoskeletal: He exhibits tenderness.  Tenderness to palpation of fourth right metacarpal.  No obvious laceration or hematoma.  Radial pulses intact bilaterally.  No pain of the wrist.  Strength against resistance intact.  Sensation of distal fingers intact.  Good cap refill.  Soft compartments.  No redness, warmth, or streaking noted.  Neurological: He is alert and oriented to person, place, and time. No sensory deficit.  Skin: Skin is warm. No rash noted.  Psychiatric: He has a normal mood and affect.  Nursing note and vitals reviewed.    ED Treatments / Results  Labs (all labs ordered are listed, but only abnormal results are displayed) Labs Reviewed - No data to display  EKG  EKG Interpretation None       Radiology Dg Hand Complete Right  Result Date: 08/17/2017 CLINICAL DATA:  Right hand shut in door. Pain at fourth metacarpal. Initial encounter. EXAM: RIGHT HAND - COMPLETE 3+ VIEW COMPARISON:  07/30/2017 FINDINGS: Minimally displaced fracture of the fourth metacarpal head is again seen with intra-articular extension into the MCP joint. This is stable in appearance since previous study. No new fractures are identified. No evidence of dislocation. IMPRESSION: Stable appearance of distal fourth metacarpal fracture. No new findings. Electronically Signed   By: Myles RosenthalJohn  Stahl M.D.   On: 08/17/2017 19:53    Procedures Procedures (including critical care time)  Medications Ordered in ED Medications  HYDROcodone-acetaminophen (NORCO/VICODIN) 5-325 MG per tablet 1 tablet (1 tablet Oral Given 08/17/17 2010)     Initial Impression / Assessment and Plan / ED Course  I have reviewed the triage vital signs and the nursing notes.  Pertinent labs & imaging results that were available during my care of the patient were reviewed by me and considered in my medical decision making (see chart for details).     Patient presenting with continued right hand pain.  Fracture of left  metacarpal found 3 weeks ago.  Physical exam reassuring, he is neurovascularly intact.  No obvious redness or swelling, doubt infection.  Repeat x-ray shows routine healing and no new injury.  Patient recently had refill of pain medicine by orthopedic doctor, I will not refill today.  Will place in new hand splint and have patient follow-up with orthopedics.  Case discussed with attending, Dr. Deretha EmoryZackowski evaluated the patient.  At this time, patient appears safe for discharge.  Return precautions given.  Patient states he understands and agrees to plan.   Final Clinical Impressions(s) / ED Diagnoses   Final diagnoses:  Closed displaced fracture of fourth metacarpal bone of right hand with routine healing, unspecified portion of metacarpal, subsequent encounter    ED Discharge Orders    None  Alveria ApleyCaccavale, Chrisanne Loose, PA-C 08/18/17 0031    Vanetta MuldersZackowski, Scott, MD 08/23/17 [phone]

## 2017-08-17 NOTE — Discharge Instructions (Signed)
Continue taking all your medicines as prescribed. Use the hand splint whenever able to help stabilize the bone and decrease pain. Ice your hand whenever possible, 20 minutes at a time, to improve swelling and pain. It is important that you follow-up with Dr. Romeo AppleHarrison for further evaluation and management of your hand pain. Return to the emergency room if you develop numbness of your fingers, color change of your fingers, or any new or worsening symptoms.

## 2017-08-19 ENCOUNTER — Telehealth: Payer: Self-pay | Admitting: Orthopedic Surgery

## 2017-08-19 NOTE — Telephone Encounter (Signed)
Patient called and stated that his hand was hurting over the weekend and he went to ER.  He said they x-rayed it and it seemed fine.  They gave him a new brace.  He wanted to know if he should come in earlier to see Dr. Romeo Rogers or just wait until his appointment on the 17th.  I told him that I would have the clinician staff get back to him.  Would you please call and advise him?  Thanks

## 2017-08-20 NOTE — Telephone Encounter (Signed)
He was in [place] brace, over weekend, he went to ER and they made him a new splint, he is asking if we need to see him sooner than his appt on Dec 17th. If he is better in the new splint then he can wait, if he has pain, we need to see him, left message for him to call me back so I can see how he currently feels.

## 2017-08-20 NOTE — Telephone Encounter (Signed)
Left another message for him to call me back. 

## 2017-08-21 ENCOUNTER — Telehealth: Payer: Self-pay | Admitting: Orthopedic Surgery

## 2017-08-21 ENCOUNTER — Other Ambulatory Visit: Payer: Self-pay | Admitting: Orthopedic Surgery

## 2017-08-21 DIAGNOSIS — S62304A Unspecified fracture of fourth metacarpal bone, right hand, initial encounter for closed fracture: Secondary | ICD-10-CM

## 2017-08-21 MED ORDER — HYDROCODONE-ACETAMINOPHEN 5-325 MG PO TABS: 1.0000 | ORAL_TABLET | Freq: Four times a day (QID) | ORAL | 0 refills | Status: DC | PRN

## 2017-08-21 NOTE — Telephone Encounter (Signed)
Office pick up

## 2017-08-21 NOTE — Telephone Encounter (Signed)
Patient requests refill on:  HYDROcodone-acetaminophen (NORCO/VICODIN) 5-325 MG tablet 28 tablet

## 2017-08-21 NOTE — Telephone Encounter (Signed)
Patient returned call; states he is doing better with the spl[facility]; therefore, at this point, will keep scheduled appointment. Aware to call back if needs to be see prior.

## 2017-08-21 NOTE — Telephone Encounter (Signed)
Patient aware.

## 2017-08-29 DIAGNOSIS — S62304A Unspecified fracture of fourth metacarpal bone, right hand, initial encounter for closed fracture: Secondary | ICD-10-CM | POA: Insufficient documentation

## 2017-09-02 ENCOUNTER — Ambulatory Visit: Payer: Medicare Other | Admitting: Orthopedic Surgery

## 2017-09-04 ENCOUNTER — Ambulatory Visit (INDEPENDENT_AMBULATORY_CARE_PROVIDER_SITE_OTHER): Payer: Medicare Other

## 2017-09-04 ENCOUNTER — Encounter: Payer: Self-pay | Admitting: Orthopedic Surgery

## 2017-09-04 ENCOUNTER — Ambulatory Visit (INDEPENDENT_AMBULATORY_CARE_PROVIDER_SITE_OTHER): Payer: Self-pay | Admitting: Orthopedic Surgery

## 2017-09-04 VITALS — Ht 72.0 in | Wt 175.0 lb

## 2017-09-04 DIAGNOSIS — S62304A Unspecified fracture of fourth metacarpal bone, right hand, initial encounter for closed fracture: Secondary | ICD-10-CM

## 2017-09-04 MED ORDER — HYDROCODONE-ACETAMINOPHEN 5-325 MG PO TABS: 1.0000 | ORAL_TABLET | Freq: Four times a day (QID) | ORAL | 0 refills | Status: DC | PRN

## 2017-09-04 NOTE — Progress Notes (Signed)
Chief Complaint  Patient presents with  . Hand Pain    right 07/30/17    Fracture care follow-up fourth metacarpal fracture right hand x-ray shows fracture healing  Clinical exam shows no rotatory deformity he actually has very good range of motion in each direction with 95% return to normal.  Minimal tenderness  Mild swelling  Recommend splint removal I showed him his range of motion exercises I refilled his prescription but he has been released to normal activity  Meds ordered this encounter  Medications  . HYDROcodone-acetaminophen (NORCO) 5-325 MG tablet    Sig: Take 1 tablet by mouth every 6 (six) hours as needed for moderate pain.    Dispense:  30 tablet    Refill:  0

## 2018-05-16 ENCOUNTER — Emergency Department (HOSPITAL_COMMUNITY)
Admission: EM | Admit: 2018-05-16 | Discharge: 2018-05-16 | Disposition: A | Discharge status: Home / Self Care | Payer: Medicare Other | Attending: Emergency Medicine | Admitting: Emergency Medicine

## 2018-05-16 ENCOUNTER — Encounter (HOSPITAL_COMMUNITY): Payer: Self-pay

## 2018-05-16 ENCOUNTER — Other Ambulatory Visit: Payer: Self-pay

## 2018-05-16 DIAGNOSIS — F17210 Nicotine dependence, cigarettes, uncomplicated: Secondary | ICD-10-CM | POA: Diagnosis not present

## 2018-05-16 DIAGNOSIS — R1013 Epigastric pain: Secondary | ICD-10-CM | POA: Insufficient documentation

## 2018-05-16 DIAGNOSIS — R109 Unspecified abdominal pain: Secondary | ICD-10-CM | POA: Diagnosis present

## 2018-05-16 LAB — CBC WITH DIFFERENTIAL/PLATELET
Basophils Absolute: 0.1 10*3/uL (ref 0.0–0.1)
Basophils Relative: 1 %
EOS PCT: 5 %
Eosinophils Absolute: 0.6 10*3/uL (ref 0.0–0.7)
HCT: 46.4 % (ref 39.0–52.0)
Hemoglobin: 15.5 g/dL (ref 13.0–17.0)
LYMPHS ABS: 1.6 10*3/uL (ref 0.7–4.0)
LYMPHS PCT: 14 %
MCH: 32 pg (ref 26.0–34.0)
MCHC: 33.4 g/dL (ref 30.0–36.0)
MCV: 95.7 fL (ref 78.0–100.0)
MONO ABS: 0.7 10*3/uL (ref 0.1–1.0)
MONOS PCT: 6 %
Neutro Abs: 8.9 10*3/uL — ABNORMAL HIGH (ref 1.7–7.7)
Neutrophils Relative %: 74 %
PLATELETS: 231 10*3/uL (ref 150–400)
RBC: 4.85 MIL/uL (ref 4.22–5.81)
RDW: 13.9 % (ref 11.5–15.5)
WBC: 11.9 10*3/uL — ABNORMAL HIGH (ref 4.0–10.5)

## 2018-05-16 LAB — COMPREHENSIVE METABOLIC PANEL
ALT: 26 U/L (ref 0–44)
AST: 21 U/L (ref 15–41)
Albumin: 4.5 g/dL (ref 3.5–5.0)
Alkaline Phosphatase: 60 U/L (ref 38–126)
Anion gap: 11 (ref 5–15)
BUN: 13 mg/dL (ref 6–20)
CHLORIDE: 109 mmol/L (ref 98–111)
CO2: 22 mmol/L (ref 22–32)
CREATININE: 0.99 mg/dL (ref 0.61–1.24)
Calcium: 9.2 mg/dL (ref 8.9–10.3)
GFR calc Af Amer: >60 mL/min (ref >60)
Glucose, Bld: 122 mg/dL — ABNORMAL HIGH (ref 70–99)
POTASSIUM: 3.7 mmol/L (ref 3.5–5.1)
Sodium: 142 mmol/L (ref 135–145)
Total Bilirubin: 0.6 mg/dL (ref 0.3–1.2)
Total Protein: 7.6 g/dL (ref 6.5–8.1)

## 2018-05-16 LAB — I-STAT TROPONIN, ED: TROPONIN I, POC: 0 ng/mL (ref 0.00–0.08)

## 2018-05-16 LAB — LIPASE, BLOOD: Lipase: 42 U/L (ref 11–51)

## 2018-05-16 MED ORDER — DICYCLOMINE HCL 20 MG PO TABS: 20.0000 mg | ORAL_TABLET | Freq: Two times a day (BID) | ORAL | 0 refills | Status: DC

## 2018-05-16 MED ORDER — HYDROMORPHONE HCL 1 MG/ML IJ SOLN
1.0000 mg | Freq: Once | INTRAMUSCULAR | Status: AC
  Administered 2018-05-16: 1 mg via INTRAVENOUS
  Filled 2018-05-16: qty 1

## 2018-05-16 MED ORDER — FAMOTIDINE IN NACL 20-0.9 MG/50ML-% IV SOLN
20.0000 mg | Freq: Once | INTRAVENOUS | Status: AC
  Administered 2018-05-16: 20 mg via INTRAVENOUS
  Filled 2018-05-16: qty 50

## 2018-05-16 MED ORDER — PANTOPRAZOLE SODIUM 40 MG PO TBEC: 40.0000 mg | DELAYED_RELEASE_TABLET | Freq: Every day | ORAL | 3 refills | Status: DC

## 2018-05-16 MED ORDER — SUCRALFATE 1 GM/10ML PO SUSP: 1.0000 g | Freq: Three times a day (TID) | ORAL | 0 refills | Status: DC

## 2018-05-16 MED ORDER — GI COCKTAIL ~~LOC~~
30.0000 mL | Freq: Once | ORAL | Status: AC
  Administered 2018-05-16: 30 mL via ORAL
  Filled 2018-05-16: qty 30

## 2018-05-16 MED ORDER — SODIUM CHLORIDE 0.9 % IV BOLUS
500.0000 mL | Freq: Once | INTRAVENOUS | Status: AC
  Administered 2018-05-16: 500 mL via INTRAVENOUS

## 2018-05-16 MED ORDER — ONDANSETRON HCL 4 MG/2ML IJ SOLN
4.0000 mg | Freq: Once | INTRAMUSCULAR | Status: AC
  Administered 2018-05-16: 4 mg via INTRAVENOUS
  Filled 2018-05-16: qty 2

## 2018-05-16 NOTE — ED Provider Notes (Signed)
[facility] EMERGENCY DEPARTMENT Provider Note   CSN: [id number] Arrival date & time: 05/16/18  0354     History   Chief Complaint Chief Complaint  Patient presents with  . Abdominal Pain    HPI [name] is a 47 y.o. male.  Presents to the emergency department for evaluation of abdominal pain.  Pain is in the upper abdomen, accompanied by nausea but no vomiting.  He has not had any diarrhea or constipation.  He denies fever.  No pain up into the chest, shortness of breath.  He has not identified alleviating or exacerbating factors.     Past Medical History:  Diagnosis Date  . Arthritis   . Chronic back pain   . History of degenerative disc disease   . History of spinal fusion     Patient Active Problem List   Diagnosis Date Noted  . Closed displaced fracture of fourth metacarpal bone of right hand, initial encounter  07/29/17 08/29/2017  . Radicular pain in right arm 04/22/2016  . Hyperlipidemia 10/16/2013  . Allergic rhinitis 01/09/2013  . Chronic low back pain 12/17/2012    Past Surgical History:  Procedure Laterality Date  . 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 SPINAL FUSION          Home Medications    Prior to Admission medications   Medication Sig Start Date End Date Taking? Authorizing Provider  acetaminophen (TYLENOL) 325 MG tablet Take 1,300 mg by mouth every 6 (six) hours as needed for mild pain.    [provider]  dicyclomine (BENTYL) 20 MG tablet Take 1 tablet (20 mg total) by mouth 2 (two) times daily. 05/16/18   Gilda CreasePollina, Shrihan Putt J, MD  pantoprazole (PROTONIX) 40 MG tablet Take 1 tablet (40 mg total) by mouth daily. 05/16/18   Gilda CreasePollina, Saranda Legrande J, MD  sucralfate (CARAFATE) 1 GM/10ML suspension Take 10 mLs (1 g total) by mouth 4 (four) times daily -  with meals and at bedtime. 05/16/18   Gilda CreasePollina,  Aero Drummonds J, MD    Family History No family history on file.  Social History Social History   Tobacco Use  . Smoking status: Current Every Day Smoker    Packs/day: 1.00    Types: Cigarettes  . Smokeless tobacco: Never Used  Substance Use Topics  . Alcohol use: No  . Drug use: No     Allergies   Toradol [ketorolac tromethamine]   Review of Systems Review of Systems  Gastrointestinal: Positive for abdominal pain and nausea.  All other systems reviewed and are negative.    Physical Exam Updated Vital Signs BP 117/68 (BP Location: Left Arm)   Pulse (!) 53   Temp 97.8 F (36.6 C) (Oral)   Resp 15   Ht 6\' 1"  (1.854 m)   Wt 81.6 kg   SpO2 98%   BMI 23.75 kg/m   Physical Exam  Constitutional: He is oriented to person, place, and time. He appears well-developed and well-nourished. No distress.  HENT:  Head: Normocephalic and atraumatic.  Right Ear: Hearing normal.  Left Ear: Hearing normal.  Nose: Nose normal.  Mouth/Throat: Oropharynx is clear and moist and mucous membranes are normal.  Eyes: Pupils are equal, round, and reactive to light. Conjunctivae and EOM are normal.  Neck: Normal range of motion. Neck supple.  Cardiovascular: Regular rhythm, S1 normal and S2 normal. Exam  reveals no gallop and no friction rub.  No murmur heard. Pulmonary/Chest: Effort normal and breath sounds normal. No respiratory distress. He exhibits no tenderness.  Abdominal: Soft. Normal appearance and bowel sounds are normal. There is no hepatosplenomegaly. There is tenderness in the epigastric area. There is no rebound, no guarding, no tenderness at McBurney's point and negative Murphy's sign. No hernia.  Musculoskeletal: Normal range of motion.  Neurological: He is alert and oriented to person, place, and time. He has normal strength. No cranial nerve deficit or sensory deficit. Coordination normal. GCS eye subscore is 4. GCS verbal subscore is 5. GCS motor subscore is 6.  Skin: Skin  is warm, dry and intact. No rash noted. No cyanosis.  Psychiatric: He has a normal mood and affect. His speech is normal and behavior is normal. Thought content normal.  Nursing note and vitals reviewed.    ED Treatments / Results  Labs (all labs ordered are listed, but only abnormal results are displayed) Labs Reviewed  CBC WITH DIFFERENTIAL/PLATELET - Abnormal; Notable for the following components:      Result Value   WBC 11.9 (*)    Neutro Abs 8.9 (*)    All other components within normal limits  COMPREHENSIVE METABOLIC PANEL - Abnormal; Notable for the following components:   Glucose, Bld 122 (*)    All other components within normal limits  LIPASE, BLOOD  I-STAT TROPONIN, ED    EKG EKG Interpretation  Date/Time:  Friday May 16 2018 04:48:05 EDT Ventricular Rate:  57 PR Interval:    QRS Duration: 152 QT Interval:  469 QTC Calculation: 457 R Axis:   92 Text Interpretation:  Sinus rhythm Borderline prolonged PR interval RBBB and LPFB Confirmed by Gilda Crease [phone] on 05/16/2018 5:29:25 AM   Radiology No results found.  Procedures Procedures (including critical care time)  Medications Ordered in ED Medications  sodium chloride 0.9 % bolus 500 mL (0 mLs Intravenous Stopped 05/16/18 0520)  HYDROmorphone (DILAUDID) injection 1 mg (1 mg Intravenous Given 05/16/18 0445)  ondansetron (ZOFRAN) injection 4 mg (4 mg Intravenous Given 05/16/18 0445)  famotidine (PEPCID) IVPB 20 mg premix (0 mg Intravenous Stopped 05/16/18 0519)  gi cocktail (Maalox,Lidocaine,Donnatal) (30 mLs Oral Given 05/16/18 0601)     Initial Impression / Assessment and Plan / ED Course  I have reviewed the triage vital signs and the nursing notes.  Pertinent labs & imaging results that were available during my care of the patient were reviewed by me and considered in my medical decision making (see chart for details).     Patient presents to the ER for evaluation of abdominal pain.   Pain is in the epigastric region.  He does have tenderness without guarding or rebound.  Deep palpation in the right upper quadrant does not elicit any pain response, doubt gallbladder disease.  Lab work is all reassuringly normal.  There is no lower abdominal or pelvic tenderness or pain.  No concern for appendicitis, diverticulitis, colitis etc.  At this point I do not believe he requires imaging.  He has had improvement with treatment here in the ER.  I did discuss this with the patient.  He is comfortable with symptomatic treatment, follow-up with PCP and GI.  He understands that he should return to the ER if his symptoms worsen.  Final Clinical Impressions(s) / ED Diagnoses   Final diagnoses:  Epigastric pain    ED Discharge Orders         Ordered  pantoprazole (PROTONIX) 40 MG tablet  Daily     05/16/18 0632    sucralfate (CARAFATE) 1 GM/10ML suspension  3 times daily with meals & bedtime     05/16/18 0632    dicyclomine (BENTYL) 20 MG tablet  2 times daily     05/16/18 1610           Gilda Crease, MD 05/16/18 [phone]

## 2018-05-16 NOTE — Discharge Instructions (Signed)
Take the prescribed medications and schedule follow-up with your primary doctor and Dr. Darrick PennaFields, a gastroenterologist.  If you have any worsening abdominal pain, fever, uncontrollable vomiting, vomiting blood or rectal bleeding, return to the ER immediately.

## 2018-05-16 NOTE — ED Notes (Signed)
ED Provider at bedside. 

## 2018-05-16 NOTE — ED Triage Notes (Signed)
Awoke at 0130 with upper abd pain and nausea.

## 2018-05-17 ENCOUNTER — Other Ambulatory Visit: Payer: Self-pay

## 2018-05-17 ENCOUNTER — Encounter (HOSPITAL_COMMUNITY): Payer: Self-pay | Admitting: *Deleted

## 2018-05-17 ENCOUNTER — Emergency Department (HOSPITAL_COMMUNITY)
Admission: EM | Admit: 2018-05-17 | Discharge: 2018-05-18 | Disposition: A | Discharge status: Home / Self Care | Payer: Medicare Other | Attending: Emergency Medicine | Admitting: Emergency Medicine

## 2018-05-17 DIAGNOSIS — K8020 Calculus of gallbladder without cholecystitis without obstruction: Secondary | ICD-10-CM | POA: Diagnosis not present

## 2018-05-17 DIAGNOSIS — F17210 Nicotine dependence, cigarettes, uncomplicated: Secondary | ICD-10-CM | POA: Diagnosis not present

## 2018-05-17 DIAGNOSIS — K828 Other specified diseases of gallbladder: Secondary | ICD-10-CM | POA: Diagnosis not present

## 2018-05-17 DIAGNOSIS — R1010 Upper abdominal pain, unspecified: Secondary | ICD-10-CM | POA: Diagnosis not present

## 2018-05-17 DIAGNOSIS — E785 Hyperlipidemia, unspecified: Secondary | ICD-10-CM | POA: Insufficient documentation

## 2018-05-17 NOTE — ED Triage Notes (Signed)
Pt returns to er with abd pain, states that he was seen two days ago and has not gotten any better,

## 2018-05-18 ENCOUNTER — Emergency Department (HOSPITAL_COMMUNITY): Payer: Medicare Other

## 2018-05-18 DIAGNOSIS — K8020 Calculus of gallbladder without cholecystitis without obstruction: Secondary | ICD-10-CM | POA: Diagnosis not present

## 2018-05-18 DIAGNOSIS — K828 Other specified diseases of gallbladder: Secondary | ICD-10-CM | POA: Diagnosis not present

## 2018-05-18 LAB — URINALYSIS, ROUTINE W REFLEX MICROSCOPIC
Bilirubin Urine: NEGATIVE
GLUCOSE, UA: NEGATIVE mg/dL
Hgb urine dipstick: NEGATIVE
KETONES UR: NEGATIVE mg/dL
Leukocytes, UA: NEGATIVE
Nitrite: NEGATIVE
PH: 5 (ref 5.0–8.0)
Protein, ur: NEGATIVE mg/dL
Specific Gravity, Urine: >1.046 — ABNORMAL HIGH (ref 1.005–1.030)

## 2018-05-18 LAB — CBC WITH DIFFERENTIAL/PLATELET
Basophils Absolute: 0.1 10*3/uL (ref 0.0–0.1)
Basophils Relative: 1 %
Eosinophils Absolute: 0.8 10*3/uL — ABNORMAL HIGH (ref 0.0–0.7)
Eosinophils Relative: 8 %
HEMATOCRIT: 46.4 % (ref 39.0–52.0)
Hemoglobin: 15.4 g/dL (ref 13.0–17.0)
LYMPHS PCT: 22 %
Lymphs Abs: 2.3 10*3/uL (ref 0.7–4.0)
MCH: 32.3 pg (ref 26.0–34.0)
MCHC: 33.2 g/dL (ref 30.0–36.0)
MCV: 97.3 fL (ref 78.0–100.0)
MONO ABS: 0.6 10*3/uL (ref 0.1–1.0)
MONOS PCT: 6 %
NEUTROS ABS: 6.5 10*3/uL (ref 1.7–7.7)
Neutrophils Relative %: 63 %
Platelets: 248 10*3/uL (ref 150–400)
RBC: 4.77 MIL/uL (ref 4.22–5.81)
RDW: 14 % (ref 11.5–15.5)
WBC: 10.2 10*3/uL (ref 4.0–10.5)

## 2018-05-18 LAB — LIPASE, BLOOD: Lipase: 30 U/L (ref 11–51)

## 2018-05-18 LAB — COMPREHENSIVE METABOLIC PANEL
ALT: 27 U/L (ref 0–44)
AST: 23 U/L (ref 15–41)
Albumin: 4.5 g/dL (ref 3.5–5.0)
Alkaline Phosphatase: 63 U/L (ref 38–126)
Anion gap: 9 (ref 5–15)
BILIRUBIN TOTAL: 0.5 mg/dL (ref 0.3–1.2)
BUN: 11 mg/dL (ref 6–20)
CO2: 25 mmol/L (ref 22–32)
CREATININE: 1.06 mg/dL (ref 0.61–1.24)
Calcium: 9.3 mg/dL (ref 8.9–10.3)
Chloride: 108 mmol/L (ref 98–111)
Glucose, Bld: 105 mg/dL — ABNORMAL HIGH (ref 70–99)
Potassium: 3.4 mmol/L — ABNORMAL LOW (ref 3.5–5.1)
Sodium: 142 mmol/L (ref 135–145)
TOTAL PROTEIN: 7.7 g/dL (ref 6.5–8.1)

## 2018-05-18 LAB — RAPID URINE DRUG SCREEN, HOSP PERFORMED
Amphetamines: NOT DETECTED
BARBITURATES: POSITIVE — AB
BENZODIAZEPINES: POSITIVE — AB
Cocaine: NOT DETECTED
Opiates: NOT DETECTED
Tetrahydrocannabinol: NOT DETECTED

## 2018-05-18 MED ORDER — SODIUM CHLORIDE 0.9 % IV BOLUS
1000.0000 mL | Freq: Once | INTRAVENOUS | Status: AC
  Administered 2018-05-18: 1000 mL via INTRAVENOUS

## 2018-05-18 MED ORDER — FENTANYL CITRATE (PF) 100 MCG/2ML IJ SOLN
50.0000 ug | Freq: Once | INTRAMUSCULAR | Status: AC
  Administered 2018-05-18: 50 ug via INTRAVENOUS
  Filled 2018-05-18: qty 2

## 2018-05-18 MED ORDER — CIPROFLOXACIN HCL 500 MG PO TABS: ORAL_TABLET | ORAL | 0 refills | Status: DC

## 2018-05-18 MED ORDER — SODIUM CHLORIDE 0.9 % IV BOLUS: 1000.0000 mL | Freq: Once | INTRAVENOUS | Status: DC

## 2018-05-18 MED ORDER — ONDANSETRON HCL 4 MG/2ML IJ SOLN
4.0000 mg | Freq: Once | INTRAMUSCULAR | Status: AC
  Administered 2018-05-18: 4 mg via INTRAVENOUS
  Filled 2018-05-18: qty 2

## 2018-05-18 MED ORDER — SODIUM CHLORIDE 0.9 % IV SOLN
2.0000 g | Freq: Once | INTRAVENOUS | Status: AC
  Administered 2018-05-18: 2 g via INTRAVENOUS
  Filled 2018-05-18: qty 20

## 2018-05-18 MED ORDER — SODIUM CHLORIDE 0.9 % IV BOLUS
500.0000 mL | Freq: Once | INTRAVENOUS | Status: AC
Start: 2018-05-18 — End: 2018-05-18
  Administered 2018-05-18: 500 mL via INTRAVENOUS

## 2018-05-18 MED ORDER — CIPROFLOXACIN HCL 250 MG PO TABS
500.0000 mg | ORAL_TABLET | Freq: Once | ORAL | Status: AC
  Administered 2018-05-18: 500 mg via ORAL
  Filled 2018-05-18: qty 2

## 2018-05-18 MED ORDER — HYDROCODONE-ACETAMINOPHEN 5-325 MG PO TABS: 1.0000 | ORAL_TABLET | Freq: Four times a day (QID) | ORAL | 0 refills | Status: DC | PRN

## 2018-05-18 MED ORDER — ONDANSETRON 4 MG PO TBDP: ORAL_TABLET | ORAL | 0 refills | Status: DC

## 2018-05-18 MED ORDER — IOPAMIDOL (ISOVUE-300) INJECTION 61%
100.0000 mL | Freq: Once | INTRAVENOUS | Status: AC | PRN
  Administered 2018-05-18: 100 mL via INTRAVENOUS

## 2018-05-18 NOTE — ED Provider Notes (Signed)
[facility] EMERGENCY DEPARTMENT Provider Note   CSN: [id number] Arrival date & time: 05/17/18  2256  Time seen 01:25 AM   History   Chief Complaint Chief Complaint  Patient presents with  . Abdominal Pain    HPI [name] is a 47 y.o. male.  HPI patient states August 26 he woke up from sleep and was having pain just above his umbilicus.  He states he took a hot bath which did not help.  He states he came to the ED and was feeling better.  However the pain returned on the evening of August 27.  He states he was however able to sleep at night however tonight he has been able to sleep.  He states the pain is constant.  He states nothing makes it feel worse although later he states it hurts worse if he walks or if he stands up straight.  Nothing makes it feel better.  He has had nausea and vomited twice in the ED but not at home.  He denies diarrhea.  He is unsure but thinks he may have had fever.  He denies any reflux symptoms or any difficulty urinating.  The pain does not radiate into his back although he has chronic back pain it is unchanged.  He states he is never had this pain before.  PCP Babs Sciara, MD   Past Medical History:  Diagnosis Date  . Arthritis   . Chronic back pain   . History of degenerative disc disease   . History of spinal fusion     Patient Active Problem List   Diagnosis Date Noted  . Closed displaced fracture of fourth metacarpal bone of right hand, initial encounter  07/29/17 08/29/2017  . Radicular pain in right arm 04/22/2016  . Hyperlipidemia 10/16/2013  . Allergic rhinitis 01/09/2013  . Chronic low back pain 12/17/2012    Past Surgical History:  Procedure Laterality Date  . 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 SPINAL FUSION          Home Medications    Prior to Admission medications   Medication  Sig Start Date End Date Taking? Authorizing Provider  acetaminophen (TYLENOL) 325 MG tablet Take 1,300 mg by mouth every 6 (six) hours as needed for mild pain.    [provider]  dicyclomine (BENTYL) 20 MG tablet Take 1 tablet (20 mg total) by mouth 2 (two) times daily. 05/16/18   Gilda Crease, MD  pantoprazole (PROTONIX) 40 MG tablet Take 1 tablet (40 mg total) by mouth daily. 05/16/18   Gilda Crease, MD  sucralfate (CARAFATE) 1 GM/10ML suspension Take 10 mLs (1 g total) by mouth 4 (four) times daily -  with meals and at bedtime. 05/16/18   Gilda Crease, MD    Family History No family history on file.  Social History Social History   Tobacco Use  . Smoking status: Current Every Day Smoker    Packs/day: 1.00    Types: Cigarettes  . Smokeless tobacco: Never Used  Substance Use Topics  . Alcohol use: No  . Drug use: No  lives at home   Allergies   Toradol [ketorolac tromethamine] and Ketorolac   Review of Systems Review of Systems  All other systems reviewed and are negative.    Physical Exam Updated Vital Signs BP 137/80   Pulse [name])  57   Temp 98.6 F (37 C) (Oral)   Resp 16   Ht 6\' 1"  (1.854 m)   Wt 81.6 kg   SpO2 99%   BMI 23.73 kg/m   Vital signs normal except bradycardia   Physical Exam  Constitutional: He is oriented to person, place, and time. He appears well-developed and well-nourished.  Non-toxic appearance. He does not appear ill. No distress.  HENT:  Head: Normocephalic and atraumatic.  Right Ear: External ear normal.  Left Ear: External ear normal.  Nose: Nose normal. No mucosal edema or rhinorrhea.  Mouth/Throat: Oropharynx is clear and moist and mucous membranes are normal. No dental abscesses or uvula swelling.  Eyes: Pupils are equal, round, and reactive to light. Conjunctivae and EOM are normal.  Neck: Normal range of motion and full passive range of motion without pain. Neck supple.  Cardiovascular:  Normal rate, regular rhythm and normal heart sounds. Exam reveals no gallop and no friction rub.  No murmur heard. Pulmonary/Chest: Effort normal and breath sounds normal. No respiratory distress. He has no wheezes. He has no rhonchi. He has no rales. He exhibits no tenderness and no crepitus.  Abdominal: Soft. Normal appearance and bowel sounds are normal. He exhibits no distension. There is tenderness in the right upper quadrant, epigastric area and left upper quadrant. There is no rebound and no guarding.    Patient is tender diffusely in his upper abdomen but is most tender in the epigastric area  Musculoskeletal: Normal range of motion. He exhibits no edema or tenderness.  Moves all extremities well.   Neurological: He is alert and oriented to person, place, and time. He has normal strength. No cranial nerve deficit.  Skin: Skin is warm, dry and intact. No rash noted. No erythema. No pallor.  Psychiatric: He has a normal mood and affect. His speech is normal and behavior is normal. His mood appears not anxious.  Nursing note and vitals reviewed.    ED Treatments / Results  Labs (all labs ordered are listed, but only abnormal results are displayed)  Results for orders placed or performed during the hospital encounter of 05/17/18  Comprehensive metabolic panel  Result Value Ref Range   Sodium 142 135 - 145 mmol/L   Potassium 3.4 (L) 3.5 - 5.1 mmol/L   Chloride 108 98 - 111 mmol/L   CO2 25 22 - 32 mmol/L   Glucose, Bld 105 (H) 70 - 99 mg/dL   BUN 11 6 - 20 mg/dL   Creatinine, Ser 1.68 0.61 - 1.24 mg/dL   Calcium 9.3 8.9 - 37.2 mg/dL   Total Protein 7.7 6.5 - 8.1 g/dL   Albumin 4.5 3.5 - 5.0 g/dL   AST 23 15 - 41 U/L   ALT 27 0 - 44 U/L   Alkaline Phosphatase 63 38 - 126 U/L   Total Bilirubin 0.5 0.3 - 1.2 mg/dL   GFR calc non Af Amer >60 >60 mL/min   GFR calc Af Amer >60 >60 mL/min   Anion gap 9 5 - 15  Lipase, blood  Result Value Ref Range   Lipase 30 11 - 51 U/L  CBC  with Differential  Result Value Ref Range   WBC 10.2 4.0 - 10.5 K/uL   RBC 4.77 4.22 - 5.81 MIL/uL   Hemoglobin 15.4 13.0 - 17.0 g/dL   HCT 90.2 11.1 - 55.2 %   MCV 97.3 78.0 - 100.0 fL   MCH 32.3 26.0 - 34.0 pg   MCHC 33.2  30.0 - 36.0 g/dL   RDW 13.0 86.5 - 78.4 %   Platelets 248 150 - 400 K/uL   Neutrophils Relative % 63 %   Neutro Abs 6.5 1.7 - 7.7 K/uL   Lymphocytes Relative 22 %   Lymphs Abs 2.3 0.7 - 4.0 K/uL   Monocytes Relative 6 %   Monocytes Absolute 0.6 0.1 - 1.0 K/uL   Eosinophils Relative 8 %   Eosinophils Absolute 0.8 (H) 0.0 - 0.7 K/uL   Basophils Relative 1 %   Basophils Absolute 0.1 0.0 - 0.1 K/uL  Urinalysis, Routine w reflex microscopic  Result Value Ref Range   Color, Urine YELLOW YELLOW   APPearance CLEAR CLEAR   Specific Gravity, Urine >1.046 (H) 1.005 - 1.030   pH 5.0 5.0 - 8.0   Glucose, UA NEGATIVE NEGATIVE mg/dL   Hgb urine dipstick NEGATIVE NEGATIVE   Bilirubin Urine NEGATIVE NEGATIVE   Ketones, ur NEGATIVE NEGATIVE mg/dL   Protein, ur NEGATIVE NEGATIVE mg/dL   Nitrite NEGATIVE NEGATIVE   Leukocytes, UA NEGATIVE NEGATIVE  Urine rapid drug screen (hosp performed)  Result Value Ref Range   Opiates NONE DETECTED NONE DETECTED   Cocaine NONE DETECTED NONE DETECTED   Benzodiazepines POSITIVE (A) NONE DETECTED   Amphetamines NONE DETECTED NONE DETECTED   Tetrahydrocannabinol NONE DETECTED NONE DETECTED   Barbiturates POSITIVE (A) NONE DETECTED   Laboratory interpretation all normal except mild hypokalemia, concentrated urine consistent with dehydration., Positive UDS   EKG None  Radiology Ct Abdomen Pelvis W Contrast  Result Date: 05/18/2018 CLINICAL DATA:  Patient with upper abdominal pain radiating to the umbilicus. EXAM: CT ABDOMEN AND PELVIS WITH CONTRAST TECHNIQUE: Multidetector CT imaging of the abdomen and pelvis was performed using the standard protocol following bolus administration of intravenous contrast. CONTRAST:  ISOVUE-300  IOPAMIDOL (ISOVUE-300) INJECTION 61% COMPARISON:  None. FINDINGS: Lower chest: Normal heart size. Dependent atelectasis. No pleural effusion. Hepatobiliary: Liver is normal in size and contour. Fatty deposition adjacent to the gallbladder fossa. Mild wall thickening of the gallbladder. No intrahepatic or extrahepatic biliary ductal dilatation. Pancreas: Unremarkable Spleen: Unremarkable Adrenals/Urinary Tract: Adrenal glands are normal. Kidneys enhance symmetrically with contrast. 2 cm cyst interpolar region left kidney. Urinary bladder is unremarkable. Stomach/Bowel: Descending colon is decompressed. Normal appendix. Normal morphology of the stomach. No evidence for small bowel obstruction. No free fluid or free intraperitoneal air. Vascular/Lymphatic: Infrarenal abdominal aorta measures 2.8 cm. Peripheral calcified atherosclerotic plaque. No retroperitoneal lymphadenopathy. Reproductive: Process unremarkable. Other: None. Musculoskeletal: Lumbar spine fusion hardware. Lower thoracic spine degenerative changes. IMPRESSION: There is mild wall thickening of the gallbladder. Possibility of mild cholecystitis not excluded. Consider right upper quadrant ultrasound for further evaluation. Abdominal aorta measures 2.8 cm. Ectatic abdominal aorta at risk for aneurysm development. Recommend followup by ultrasound in 5 years. This recommendation follows ACR consensus guidelines: White Paper of the ACR Incidental Findings Committee II on Vascular Findings. J Am Coll Radiol 2013; 10:789-794. Electronically Signed   By: Annia Belt M.D.   On: 05/18/2018 02:47    Procedures Procedures (including critical care time)  Medications Ordered in ED Medications  sodium chloride 0.9 % bolus 1,000 mL (has no administration in time range)  sodium chloride 0.9 % bolus 1,000 mL (has no administration in time range)  sodium chloride 0.9 % bolus 1,000 mL (0 mLs Intravenous Stopped 05/18/18 0343)  ondansetron (ZOFRAN) injection 4 mg (4  mg Intravenous Given 05/18/18 0148)  sodium chloride 0.9 % bolus 500 mL (0 mLs Intravenous Stopped  05/18/18 0343)  fentaNYL (SUBLIMAZE) injection 50 mcg (50 mcg Intravenous Given 05/18/18 0149)  iopamidol (ISOVUE-300) 61 % injection 100 mL (100 mLs Intravenous Contrast Given 05/18/18 0219)  cefTRIAXone (ROCEPHIN) 2 g in sodium chloride 0.9 % 100 mL IVPB (0 g Intravenous Stopped 05/18/18 0448)     Initial Impression / Assessment and Plan / ED Course  I have reviewed the triage vital signs and the nursing notes.  Pertinent labs & imaging results that were available during my care of the patient were reviewed by me and considered in my medical decision making (see chart for details).   Patient had an IV inserted and was given IV nausea and pain medication.  Since he has had to return for similar symptoms CT scan of the abdomen and pelvis was done.   3:30 AM after reviewing his CT result he was given Rocephin 2 g IV for possible cholecystitis.  However his blood testing is normal with normal white blood cell count and normal LFTs and lipaseAM I discussed with the patient that his CT showed possible inflammation of the gallbladder.  He was started on IV Rocephin 2 g for possible cholecystitis.  It is atypical and that all of his labs are normal.  I talked to the patient that he needs ultrasound to evaluate this further.  At this point I think patient can be kept in the ED to get his ultrasound rather than discharge and  have him come back later today.  They were agreeable.  After reviewing patient's urinalysis he is noted to still be very dehydrated.  He was given more IV fluids.  08:00 AM patient left with Dr. Estell Harpin at change of shift to get his right upper quadrant ultrasound to evaluate his gallbladder more closely.     Review of the [place] shows this patient was getting tramadol in October 2018 once, and in November he got 3 prescriptions for hydrocodone 5/325 30 tablets or less from  an orthopedist, and in December 2018 two prescriptions for hydrocodone 5/325 30 tablets and less from an orthopedist.  Final Clinical Impressions(s) / ED Diagnoses   Final diagnoses:  Upper abdominal pain    Disposition pending  Devoria Albe, MD, Concha Pyo, MD 05/18/18 [phone]

## 2018-05-18 NOTE — Discharge Instructions (Addendum)
Call Dr. Lovell Sheehan office Tuesday to get an appointment this week.  Return to the emergency department if any problems prior to that

## 2018-05-20 ENCOUNTER — Telehealth: Payer: Self-pay | Admitting: Emergency Medicine

## 2018-05-26 ENCOUNTER — Ambulatory Visit (INDEPENDENT_AMBULATORY_CARE_PROVIDER_SITE_OTHER): Payer: Medicare Other | Admitting: Family Medicine

## 2018-05-26 ENCOUNTER — Encounter: Payer: Self-pay | Admitting: Family Medicine

## 2018-05-26 VITALS — BP 130/86 | Temp 97.6°F | Ht 73.0 in | Wt 181.4 lb

## 2018-05-26 DIAGNOSIS — R1013 Epigastric pain: Secondary | ICD-10-CM

## 2018-05-26 MED ORDER — ONDANSETRON 8 MG PO TBDP: 8.0000 mg | ORAL_TABLET | Freq: Three times a day (TID) | ORAL | 3 refills | Status: DC | PRN

## 2018-05-26 MED ORDER — HYDROCODONE-ACETAMINOPHEN 5-325 MG PO TABS: 1.0000 | ORAL_TABLET | Freq: Four times a day (QID) | ORAL | 0 refills | Status: DC | PRN

## 2018-05-26 NOTE — Progress Notes (Signed)
   Subjective:    Patient ID: [name], male    DOB: October 11, 1970, 47 y.o.   MRN: [id number]  HPI Pt here today for hospital follow up. Pt was in ER on 05/17/18 for stomach pain. Pt is going to see Dr.Jenkins on 06/05/18. Pt states he is still having abdominal pain.  Epigastric pain constant, non-radiating, worse at night. 6/10 right now, but gets worse as the day goes on. Nausea, no vomiting. No diarrhea/constipation. No blood in stool.    Taking protonix, carafate, and bentyl that were rx in ED which have been helpful, no refills needed on these medications.  States out of hydrocodone and zofran.  Reports zofran helped a lot with nausea.    Patient also reporting concerns about excessive snoring.  Derinda Late questionnaire will be given to patient to fill out at home and can f/u for further evaluation.  Review of Systems  Gastrointestinal: Positive for abdominal pain (epigastric) and nausea. Negative for blood in stool, constipation, diarrhea and vomiting.       Objective:   Physical Exam  Constitutional: He is oriented to person, place, and time. No distress.  HENT:  Head: Normocephalic and atraumatic.  Eyes: Right eye exhibits no discharge. Left eye exhibits no discharge.  Neck: Neck supple.  Cardiovascular: Normal rate, regular rhythm and normal heart sounds.  Pulmonary/Chest: Effort normal and breath sounds normal. No respiratory distress.  Abdominal: Soft. Bowel sounds are normal. He exhibits no distension. There is tenderness (epigastric).  Neurological: He is alert and oriented to person, place, and time.  Skin: Skin is warm and dry.  Psychiatric: He has a normal mood and affect.  Vitals reviewed.      Assessment & Plan:  Epigastric pain F/u from recent ED visit on 8/31.  Pt completed course of Cipro rx in ED.  Still taking protonix, carafate, and bentyl with some relief of pain.  Pt would like refill of zofran, rx sent in to pharmacy. Short course of hydrocodone rx by Dr.  Lorin Picket for severe pain.  He has an appt with general surgery on 9/19, may f/u with [place] sooner if needed, warning signs discussed.   Has gallstones on his ultrasound with slight dilation of the common bile duct.  His pain is more in the epigastric region. He is taking up with GI.  It is possible that general surgery will do further evaluation possible EGD or he may need to have his gallbladder taken out this decision making will be left to the specialist  As attending physician to this patient visit, this patient was seen in conjunction with the nurse practitioner.  The history,physical and treatment plan was reviewed with the nurse practitioner and pertinent findings were verified with the patient.  Also the treatment plan was reviewed with the patient while they were present.  [place] questionnaire given to patient to fill out and bring to f/u appt to further evaluate his snoring.

## 2018-06-05 ENCOUNTER — Ambulatory Visit (INDEPENDENT_AMBULATORY_CARE_PROVIDER_SITE_OTHER): Payer: Medicare Other | Admitting: General Surgery

## 2018-06-05 ENCOUNTER — Encounter: Payer: Self-pay | Admitting: General Surgery

## 2018-06-05 VITALS — BP 132/86 | HR 68 | Temp 97.3°F | Resp 20 | Wt 192.0 lb

## 2018-06-05 DIAGNOSIS — K8020 Calculus of gallbladder without cholecystitis without obstruction: Secondary | ICD-10-CM | POA: Diagnosis not present

## 2018-06-05 MED ORDER — DICYCLOMINE HCL 20 MG PO TABS: 20.0000 mg | ORAL_TABLET | Freq: Two times a day (BID) | ORAL | 0 refills | Status: DC

## 2018-06-05 MED ORDER — HYDROCODONE-ACETAMINOPHEN 5-325 MG PO TABS: 1.0000 | ORAL_TABLET | ORAL | 0 refills | Status: DC | PRN

## 2018-06-05 NOTE — H&P (Signed)
[name]; [id number]; 1971-03-30   HPI Patient is a 47 year old white male who was referred to my care by Dr. Lilyan Punt for evaluation treatment of abdominal pain secondary to gallstones.  Patient has had been having intermittent right upper quadrant, nausea, and fatty food intolerance for many weeks.  He denies any fever, chills, jaundice.  The symptoms seem to wax and wane.  He currently has a pain level 4 out of 10.  He has tried to adjust his diet, but this is not helping. Past Medical History:  Diagnosis Date  . Arthritis   . Chronic back pain   . History of degenerative disc disease   . History of spinal fusion     Past Surgical History:  Procedure Laterality Date  . 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 SPINAL FUSION      History reviewed. No pertinent family history.  Current Outpatient Medications on File Prior to Visit  Medication Sig Dispense Refill  . acetaminophen (TYLENOL) 325 MG tablet Take 1,300 mg by mouth every 6 (six) hours as needed for mild pain.    . ciprofloxacin (CIPRO) 500 MG tablet One po bid (Patient not taking: Reported on 06/05/2018) 10 tablet 0  . ondansetron (ZOFRAN-ODT) 8 MG disintegrating tablet Take 1 tablet (8 mg total) by mouth every 8 (eight) hours as needed for nausea or vomiting. (Patient not taking: Reported on 06/05/2018) 15 tablet 3  . pantoprazole (PROTONIX) 40 MG tablet Take 1 tablet (40 mg total) by mouth daily. (Patient not taking: Reported on 06/05/2018) 30 tablet 3  . sucralfate (CARAFATE) 1 GM/10ML suspension Take 10 mLs (1 g total) by mouth 4 (four) times daily -  with meals and at bedtime. (Patient not taking: Reported on 06/05/2018) 420 mL 0   No current facility-administered medications on file prior to visit.     Allergies  Allergen Reactions  . Toradol [Ketorolac Tromethamine] Itching and  Swelling  . Ketorolac Rash    Social History   Substance and Sexual Activity  Alcohol Use No    Social History   Tobacco Use  Smoking Status Current Every Day Smoker  . Packs/day: 1.00  . Types: Cigarettes  Smokeless Tobacco Never Used    Review of Systems  HENT: Negative.   Eyes: Negative.   Respiratory: Positive for cough and wheezing.   Cardiovascular: Negative.   Gastrointestinal: Positive for abdominal pain and heartburn.  Genitourinary: Negative.   Musculoskeletal: Positive for back pain.  Neurological: Positive for headaches.  Endo/Heme/Allergies: Negative.   Psychiatric/Behavioral: Negative.     Objective   Vitals:   06/05/18 0900  BP: 132/86  Pulse: 68  Resp: 20  Temp: (!) 97.3 F (36.3 C)    Physical Exam  Constitutional: He is oriented to person, place, and time. He appears well-developed and well-nourished. He does not appear ill.  HENT:  Head: Normocephalic and atraumatic.  Eyes: No scleral icterus.  Pulmonary/Chest: Effort normal and breath sounds normal. No stridor. No respiratory distress. He has no wheezes. He has no rhonchi. He has no rales.  Abdominal: Normal appearance and bowel sounds are normal. There is no hepatosplenomegaly. There is tenderness in the right upper quadrant. There is positive Murphy's sign. There is no rigidity, no rebound and no guarding.  Neurological: He is alert and oriented to person, place, and time.  Skin: Skin is warm  and dry.  Vitals reviewed.  Ultrasound report reviewed Dr. Fanny DanceScott Luking's notes reviewed Assessment  Biliary colic, cholelithiasis Plan   Patient is scheduled for laparoscopic cholecystectomy with cholangiograms on 06/13/2018.  The risks and benefits of the procedure including bleeding, infection, hepatobiliary injury, and the possibility of an open procedure were fully explained to the patient, who gave informed consent.

## 2018-06-05 NOTE — Progress Notes (Addendum)
[name]; [id number]; June 23, 1971   HPI Patient is a 47 year old white male who was referred to my care by Dr. Lilyan Punt for evaluation treatment of abdominal pain secondary to gallstones.  Patient has had been having intermittent right upper quadrant, nausea, and fatty food intolerance for many weeks.  He denies any fever, chills, jaundice.  The symptoms seem to wax and wane.  He currently has a pain level 4 out of 10.  He has tried to adjust his diet, but this is not helping. Past Medical History:  Diagnosis Date  . Arthritis   . Chronic back pain   . History of degenerative disc disease   . History of spinal fusion     Past Surgical History:  Procedure Laterality Date  . 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 SPINAL FUSION      History reviewed. No pertinent family history.  Current Outpatient Medications on File Prior to Visit  Medication Sig Dispense Refill  . acetaminophen (TYLENOL) 325 MG tablet Take 1,300 mg by mouth every 6 (six) hours as needed for mild pain.    . ciprofloxacin (CIPRO) 500 MG tablet One po bid (Patient not taking: Reported on 06/05/2018) 10 tablet 0  . ondansetron (ZOFRAN-ODT) 8 MG disintegrating tablet Take 1 tablet (8 mg total) by mouth every 8 (eight) hours as needed for nausea or vomiting. (Patient not taking: Reported on 06/05/2018) 15 tablet 3  . pantoprazole (PROTONIX) 40 MG tablet Take 1 tablet (40 mg total) by mouth daily. (Patient not taking: Reported on 06/05/2018) 30 tablet 3  . sucralfate (CARAFATE) 1 GM/10ML suspension Take 10 mLs (1 g total) by mouth 4 (four) times daily -  with meals and at bedtime. (Patient not taking: Reported on 06/05/2018) 420 mL 0   No current facility-administered medications on file prior to visit.     Allergies  Allergen Reactions  . Toradol [Ketorolac Tromethamine] Itching and  Swelling  . Ketorolac Rash    Social History   Substance and Sexual Activity  Alcohol Use No    Social History   Tobacco Use  Smoking Status Current Every Day Smoker  . Packs/day: 1.00  . Types: Cigarettes  Smokeless Tobacco Never Used    Review of Systems  HENT: Negative.   Eyes: Negative.   Respiratory: Positive for cough and wheezing.   Cardiovascular: Negative.   Gastrointestinal: Positive for abdominal pain and heartburn.  Genitourinary: Negative.   Musculoskeletal: Positive for back pain.  Neurological: Positive for headaches.  Endo/Heme/Allergies: Negative.   Psychiatric/Behavioral: Negative.     Objective   Vitals:   06/05/18 0900  BP: 132/86  Pulse: 68  Resp: 20  Temp: (!) 97.3 F (36.3 C)    Physical Exam  Constitutional: He is oriented to person, place, and time. He appears well-developed and well-nourished. He does not appear ill.  HENT:  Head: Normocephalic and atraumatic.  Eyes: No scleral icterus.  Pulmonary/Chest: Effort normal and breath sounds normal. No stridor. No respiratory distress. He has no wheezes. He has no rhonchi. He has no rales.  Abdominal: Normal appearance and bowel sounds are normal. There is no hepatosplenomegaly. There is tenderness in the right upper quadrant. There is positive Murphy's sign. There is no rigidity, no rebound and no guarding.  Neurological: He is alert and oriented to person, place, and time.  Skin: Skin is warm  and dry.  Vitals reviewed.  Ultrasound report reviewed Dr. Fanny DanceScott Luking's notes reviewed Assessment  Biliary colic, cholelithiasis Plan   Patient is scheduled for laparoscopic cholecystectomy with cholangiograms on 06/13/2018.  The risks and benefits of the procedure including bleeding, infection, hepatobiliary injury, and the possibility of an open procedure were fully explained to the patient, who gave informed consent.  Bentyl and Norco reordered for control of his symptoms.

## 2018-06-05 NOTE — Patient Instructions (Signed)

## 2018-06-11 ENCOUNTER — Encounter (HOSPITAL_COMMUNITY): Payer: Self-pay

## 2018-06-11 ENCOUNTER — Encounter (HOSPITAL_COMMUNITY)
Admission: RE | Admit: 2018-06-11 | Discharge: 2018-06-11 | Disposition: A | Discharge status: Home / Self Care | Payer: Medicare Other | Source: Ambulatory Visit | Attending: General Surgery | Admitting: General Surgery

## 2018-06-13 ENCOUNTER — Encounter (HOSPITAL_COMMUNITY)
Admission: RE | Disposition: A | Discharge status: Home / Self Care | Payer: Self-pay | Source: Ambulatory Visit | Attending: General Surgery

## 2018-06-13 ENCOUNTER — Ambulatory Visit (HOSPITAL_COMMUNITY): Payer: Medicare Other | Admitting: Anesthesiology

## 2018-06-13 ENCOUNTER — Encounter (HOSPITAL_COMMUNITY): Payer: Self-pay | Admitting: *Deleted

## 2018-06-13 ENCOUNTER — Ambulatory Visit (HOSPITAL_COMMUNITY)
Admission: RE | Admit: 2018-06-13 | Discharge: 2018-06-13 | Disposition: A | Discharge status: Home / Self Care | Payer: Medicare Other | Source: Ambulatory Visit | Attending: General Surgery | Admitting: General Surgery

## 2018-06-13 DIAGNOSIS — Z79899 Other long term (current) drug therapy: Secondary | ICD-10-CM | POA: Diagnosis not present

## 2018-06-13 DIAGNOSIS — K8010 Calculus of gallbladder with chronic cholecystitis without obstruction: Secondary | ICD-10-CM | POA: Insufficient documentation

## 2018-06-13 DIAGNOSIS — F17210 Nicotine dependence, cigarettes, uncomplicated: Secondary | ICD-10-CM | POA: Diagnosis not present

## 2018-06-13 DIAGNOSIS — Z89422 Acquired absence of other left toe(s): Secondary | ICD-10-CM | POA: Diagnosis not present

## 2018-06-13 DIAGNOSIS — K8020 Calculus of gallbladder without cholecystitis without obstruction: Secondary | ICD-10-CM

## 2018-06-13 HISTORY — PX: CHOLECYSTECTOMY: SHX55

## 2018-06-13 SURGERY — LAPAROSCOPIC CHOLECYSTECTOMY
Anesthesia: General | Site: Abdomen

## 2018-06-13 MED ORDER — PROPOFOL 10 MG/ML IV BOLUS
INTRAVENOUS | Status: AC
  Filled 2018-06-13: qty 20

## 2018-06-13 MED ORDER — SUGAMMADEX SODIUM 200 MG/2ML IV SOLN
INTRAVENOUS | Status: AC
  Filled 2018-06-13: qty 2

## 2018-06-13 MED ORDER — HEMOSTATIC AGENTS (NO CHARGE) OPTIME
TOPICAL | Status: DC | PRN
  Administered 2018-06-13 (×2): 1 via TOPICAL

## 2018-06-13 MED ORDER — ROCURONIUM BROMIDE 50 MG/5ML IV SOLN
INTRAVENOUS | Status: AC
  Filled 2018-06-13: qty 1

## 2018-06-13 MED ORDER — PROPOFOL 10 MG/ML IV BOLUS
INTRAVENOUS | Status: DC | PRN
  Administered 2018-06-13: 200 mg via INTRAVENOUS

## 2018-06-13 MED ORDER — EPHEDRINE SULFATE 50 MG/ML IJ SOLN
INTRAMUSCULAR | Status: AC
  Filled 2018-06-13: qty 1

## 2018-06-13 MED ORDER — SUCCINYLCHOLINE CHLORIDE 20 MG/ML IJ SOLN
INTRAMUSCULAR | Status: AC
  Filled 2018-06-13: qty 1

## 2018-06-13 MED ORDER — SODIUM CHLORIDE 0.9 % IJ SOLN
INTRAMUSCULAR | Status: AC
  Filled 2018-06-13: qty 10

## 2018-06-13 MED ORDER — CHLORHEXIDINE GLUCONATE CLOTH 2 % EX PADS: 6.0000 | MEDICATED_PAD | Freq: Once | CUTANEOUS | Status: DC

## 2018-06-13 MED ORDER — POVIDONE-IODINE 10 % EX OINT
TOPICAL_OINTMENT | CUTANEOUS | Status: AC
  Filled 2018-06-13: qty 1

## 2018-06-13 MED ORDER — LACTATED RINGERS IV SOLN: INTRAVENOUS | Status: DC

## 2018-06-13 MED ORDER — SUCCINYLCHOLINE CHLORIDE 20 MG/ML IJ SOLN
INTRAMUSCULAR | Status: DC | PRN
  Administered 2018-06-13: 120 mg via INTRAVENOUS

## 2018-06-13 MED ORDER — MEPERIDINE HCL 50 MG/ML IJ SOLN: 6.2500 mg | INTRAMUSCULAR | Status: DC | PRN

## 2018-06-13 MED ORDER — OXYCODONE-ACETAMINOPHEN 7.5-325 MG PO TABS: 1.0000 | ORAL_TABLET | Freq: Four times a day (QID) | ORAL | 0 refills | Status: DC | PRN

## 2018-06-13 MED ORDER — EPHEDRINE SULFATE 50 MG/ML IJ SOLN
INTRAMUSCULAR | Status: DC | PRN
  Administered 2018-06-13: 5 mg via INTRAVENOUS

## 2018-06-13 MED ORDER — MIDAZOLAM HCL 2 MG/2ML IJ SOLN
INTRAMUSCULAR | Status: DC | PRN
  Administered 2018-06-13: 2 mg via INTRAVENOUS

## 2018-06-13 MED ORDER — POVIDONE-IODINE 10 % OINT PACKET
TOPICAL_OINTMENT | CUTANEOUS | Status: DC | PRN
  Administered 2018-06-13: 1 via TOPICAL

## 2018-06-13 MED ORDER — HYDROMORPHONE HCL 1 MG/ML IJ SOLN
0.2500 mg | INTRAMUSCULAR | Status: DC | PRN
  Administered 2018-06-13 (×3): 0.5 mg via INTRAVENOUS
  Filled 2018-06-13 (×3): qty 0.5

## 2018-06-13 MED ORDER — MIDAZOLAM HCL 2 MG/2ML IJ SOLN
INTRAMUSCULAR | Status: AC
  Filled 2018-06-13: qty 2

## 2018-06-13 MED ORDER — ONDANSETRON HCL 4 MG/2ML IJ SOLN
INTRAMUSCULAR | Status: AC
  Filled 2018-06-13: qty 2

## 2018-06-13 MED ORDER — BUPIVACAINE LIPOSOME 1.3 % IJ SUSP
INTRAMUSCULAR | Status: AC
  Filled 2018-06-13: qty 20

## 2018-06-13 MED ORDER — FENTANYL CITRATE (PF) 250 MCG/5ML IJ SOLN
INTRAMUSCULAR | Status: AC
  Filled 2018-06-13: qty 5

## 2018-06-13 MED ORDER — BUPIVACAINE LIPOSOME 1.3 % IJ SUSP
INTRAMUSCULAR | Status: DC | PRN
  Administered 2018-06-13: 10 mL

## 2018-06-13 MED ORDER — ROCURONIUM BROMIDE 100 MG/10ML IV SOLN
INTRAVENOUS | Status: DC | PRN
  Administered 2018-06-13: 20 mg via INTRAVENOUS
  Administered 2018-06-13: 10 mg via INTRAVENOUS

## 2018-06-13 MED ORDER — LACTATED RINGERS IV SOLN
INTRAVENOUS | Status: DC
  Administered 2018-06-13: 08:00:00 via INTRAVENOUS
  Administered 2018-06-13: 1000 mL via INTRAVENOUS

## 2018-06-13 MED ORDER — SUGAMMADEX SODIUM 500 MG/5ML IV SOLN
INTRAVENOUS | Status: DC | PRN
  Administered 2018-06-13: 200 mg via INTRAVENOUS

## 2018-06-13 MED ORDER — CIPROFLOXACIN IN D5W 400 MG/200ML IV SOLN
400.0000 mg | INTRAVENOUS | Status: AC
  Administered 2018-06-13: 400 mg via INTRAVENOUS
  Filled 2018-06-13: qty 200

## 2018-06-13 MED ORDER — FENTANYL CITRATE (PF) 100 MCG/2ML IJ SOLN
INTRAMUSCULAR | Status: DC | PRN
  Administered 2018-06-13 (×2): 25 ug via INTRAVENOUS
  Administered 2018-06-13: 50 ug via INTRAVENOUS
  Administered 2018-06-13: 100 ug via INTRAVENOUS
  Administered 2018-06-13: 50 ug via INTRAVENOUS

## 2018-06-13 MED ORDER — HYDROCODONE-ACETAMINOPHEN 7.5-325 MG PO TABS: 1.0000 | ORAL_TABLET | Freq: Once | ORAL | Status: DC | PRN

## 2018-06-13 MED ORDER — PROMETHAZINE HCL 25 MG/ML IJ SOLN: 6.2500 mg | INTRAMUSCULAR | Status: DC | PRN

## 2018-06-13 MED ORDER — 0.9 % SODIUM CHLORIDE (POUR BTL) OPTIME
TOPICAL | Status: DC | PRN
  Administered 2018-06-13: 1000 mL

## 2018-06-13 SURGICAL SUPPLY — 59 items
APL SRG 38 LTWT LNG FL B (MISCELLANEOUS) ×1
APPLICATOR ARISTA FLEXITIP XL (MISCELLANEOUS) ×2 IMPLANT
APPLIER CLIP ROT 10 11.4 M/L (STAPLE) ×3
APR CLP MED LRG 11.4X10 (STAPLE) ×1
BAG RETRIEVAL 10 (BASKET) ×1
BAG RETRIEVAL 10MM (BASKET) ×1
CHLORAPREP W/TINT 26ML (MISCELLANEOUS) ×3 IMPLANT
CLIP APPLIE ROT 10 11.4 M/L (STAPLE) ×1 IMPLANT
CLOTH BEACON ORANGE TIMEOUT ST (SAFETY) ×3 IMPLANT
COVER LIGHT HANDLE STERIS (MISCELLANEOUS) ×6 IMPLANT
CUTTER FLEX LINEAR 45M (STAPLE) ×2 IMPLANT
DISSECTOR BLUNT TIP ENDO 5MM (MISCELLANEOUS) ×2 IMPLANT
ELECT REM PT RETURN 9FT ADLT (ELECTROSURGICAL) ×3
ELECTRODE REM PT RTRN 9FT ADLT (ELECTROSURGICAL) ×1 IMPLANT
FILTER SMOKE EVAC LAPAROSHD (FILTER) ×3 IMPLANT
GLOVE BIO SURGEON STRL SZ7 (GLOVE) ×3 IMPLANT
GLOVE BIOGEL PI IND STRL 6.5 (GLOVE) IMPLANT
GLOVE BIOGEL PI IND STRL 7.0 (GLOVE) ×2 IMPLANT
GLOVE BIOGEL PI INDICATOR 6.5 (GLOVE) ×2
GLOVE BIOGEL PI INDICATOR 7.0 (GLOVE) ×4
GLOVE SURG SS PI 6.5 STRL IVOR (GLOVE) ×2 IMPLANT
GLOVE SURG SS PI 7.5 STRL IVOR (GLOVE) ×3 IMPLANT
GOWN STRL REUS W/ TWL XL LVL3 (GOWN DISPOSABLE) ×1 IMPLANT
GOWN STRL REUS W/TWL LRG LVL3 (GOWN DISPOSABLE) ×6 IMPLANT
GOWN STRL REUS W/TWL XL LVL3 (GOWN DISPOSABLE) ×3
HEMOSTAT ARISTA ABSORB 3G PWDR (MISCELLANEOUS) ×2 IMPLANT
HEMOSTAT SNOW SURGICEL 2X4 (HEMOSTASIS) ×3 IMPLANT
INST SET LAPROSCOPIC AP (KITS) ×3 IMPLANT
IV NS IRRIG 3000ML ARTHROMATIC (IV SOLUTION) IMPLANT
KIT TURNOVER KIT A (KITS) ×3 IMPLANT
MANIFOLD NEPTUNE II (INSTRUMENTS) ×3 IMPLANT
NDL INSUFFLATION 14GA 120MM (NEEDLE) ×1 IMPLANT
NEEDLE HYPO 18GX1.5 BLUNT FILL (NEEDLE) ×3 IMPLANT
NEEDLE HYPO 22GX1.5 SAFETY (NEEDLE) ×3 IMPLANT
NEEDLE INSUFFLATION 14GA 120MM (NEEDLE) ×3 IMPLANT
NS IRRIG 1000ML POUR BTL (IV SOLUTION) ×3 IMPLANT
PACK LAP CHOLE LZT030E (CUSTOM PROCEDURE TRAY) ×3 IMPLANT
PAD ARMBOARD 7.5X6 YLW CONV (MISCELLANEOUS) ×3 IMPLANT
PENCIL HANDSWITCHING (ELECTRODE) ×3 IMPLANT
RELOAD 45 VASCULAR/THIN (ENDOMECHANICALS) ×3 IMPLANT
RELOAD STAPLE 45 2.5 WHT GRN (ENDOMECHANICALS) IMPLANT
SET BASIN LINEN APH (SET/KITS/TRAYS/PACK) ×3 IMPLANT
SET TUBE IRRIG SUCTION NO TIP (IRRIGATION / IRRIGATOR) IMPLANT
SLEEVE ENDOPATH XCEL 5M (ENDOMECHANICALS) ×3 IMPLANT
SPONGE GAUZE 2X2 8PLY STER LF (GAUZE/BANDAGES/DRESSINGS) ×1
SPONGE GAUZE 2X2 8PLY STRL LF (GAUZE/BANDAGES/DRESSINGS) ×2 IMPLANT
STAPLER VISISTAT (STAPLE) ×3 IMPLANT
SUT VICRYL 0 UR6 27IN ABS (SUTURE) ×7 IMPLANT
SYR 20CC LL (SYRINGE) ×3 IMPLANT
SYS BAG RETRIEVAL 10MM (BASKET) ×1
SYSTEM BAG RETRIEVAL 10MM (BASKET) ×1 IMPLANT
TAPE PAPER 2X10 WHT MICROPORE (GAUZE/BANDAGES/DRESSINGS) ×2 IMPLANT
TROCAR ENDO BLADELESS 11MM (ENDOMECHANICALS) ×3 IMPLANT
TROCAR XCEL NON-BLD 5MMX100MML (ENDOMECHANICALS) ×3 IMPLANT
TROCAR XCEL UNIV SLVE 11M 100M (ENDOMECHANICALS) ×3 IMPLANT
TUBE CONNECTING 12'X1/4 (SUCTIONS) ×1
TUBE CONNECTING 12X1/4 (SUCTIONS) ×2 IMPLANT
TUBING INSUFFLATION (TUBING) ×3 IMPLANT
WARMER LAPAROSCOPE (MISCELLANEOUS) ×3 IMPLANT

## 2018-06-13 NOTE — Transfer of Care (Signed)
Immediate Anesthesia Transfer of Care Note  Patient: [name]  Procedure(s) Performed: LAPAROSCOPIC CHOLECYSTECTOMY (N/A Abdomen)  Patient Location: PACU  Anesthesia Type:General  Level of Consciousness: awake and patient cooperative  Airway & Oxygen Therapy: Patient Spontanous Breathing and non-rebreather face mask  Post-op Assessment: Report given to RN and Post -op Vital signs reviewed and stable  Post vital signs: Reviewed and stable  Last Vitals:  Vitals Value Taken Time  BP 108/75 06/13/2018  8:40 AM  Temp    Pulse 89 06/13/2018  8:41 AM  Resp 15 06/13/2018  8:41 AM  SpO2 100 % 06/13/2018  8:41 AM  Vitals shown include unvalidated device data.  Last Pain:  Vitals:   06/13/18 0656  TempSrc: Oral  PainSc: 5       Patients Stated Pain Goal: 5 (06/13/18 1610)  Complications: No apparent anesthesia complications

## 2018-06-13 NOTE — Discharge Instructions (Signed)
Monitored Anesthesia Care, Care After These instructions provide you with information about caring for yourself after your procedure. Your health care provider may also give you more specific instructions. Your treatment has been planned according to current medical practices, but problems sometimes occur. Call your health care provider if you have any problems or questions after your procedure. What can I expect after the procedure? After your procedure, it is common to:  Feel sleepy for several hours.  Feel clumsy and have poor balance for several hours.  Feel forgetful about what happened after the procedure.  Have poor judgment for several hours.  Feel nauseous or vomit.  Have a sore throat if you had a breathing tube during the procedure.  Follow these instructions at home: For at least 24 hours after the procedure:   Do not: ? Participate in activities in which you could fall or become injured. ? Drive. ? Use heavy machinery. ? Drink alcohol. ? Take sleeping pills or medicines that cause drowsiness. ? Make important decisions or sign legal documents. ? Take care of children on your own.  Rest. Eating and drinking  Follow the diet that is recommended by your health care provider.  If you vomit, drink water, juice, or soup when you can drink without vomiting.  Make sure you have little or no nausea before eating solid foods. General instructions  Have a responsible adult stay with you until you are awake and alert.  Take over-the-counter and prescription medicines only as told by your health care provider.  If you smoke, do not smoke without supervision.  Keep all follow-up visits as told by your health care provider. This is important. Contact a health care provider if:  You keep feeling nauseous or you keep vomiting.  You feel light-headed.  You develop a rash.  You have a fever. Get help right away if:  You have trouble breathing. This information is  not intended to replace advice given to you by your health care provider. Make sure you discuss any questions you have with your health care provider. Document Released: 12/25/2015 Document Revised: 04/25/2016 Document Reviewed: 12/25/2015 Elsevier Interactive Patient Education  2018 [employer]. Laparoscopic Cholecystectomy, Care After This sheet gives you information about how to care for yourself after your procedure. Your health care provider may also give you more specific instructions. If you have problems or questions, contact your health care provider. What can I expect after the procedure? After the procedure, it is common to have:  Pain at your incision sites. You will be given medicines to control this pain.  Mild nausea or vomiting.  Bloating and possible shoulder pain from the air-like gas that was used during the procedure.  Follow these instructions at home: Incision care   Follow instructions from your health care provider about how to take care of your incisions. Make sure you: ? Wash your hands with soap and water before you change your bandage (dressing). If soap and water are not available, use hand sanitizer. ? Change your dressing as told by your health care provider. ? Leave stitches (sutures), skin glue, or adhesive strips in place. These skin closures may need to be in place for 2 weeks or longer. If adhesive strip edges start to loosen and curl up, you may trim the loose edges. Do not remove adhesive strips completely unless your health care provider tells you to do that.  Do not take baths, swim, or use a hot tub until your health care provider approves.  Ask your health care provider if you can take showers. You may only be allowed to take sponge baths for bathing.  Check your incision area every day for signs of infection. Check for: ? More redness, swelling, or pain. ? More fluid or blood. ? Warmth. ? Pus or a bad smell. Activity  Do not drive or use  heavy machinery while taking prescription pain medicine.  Do not lift anything that is heavier than 10 lb (4.5 kg) until your health care provider approves.  Do not play contact sports until your health care provider approves.  Do not drive for 24 hours if you were given a medicine to help you relax (sedative).  Rest as needed. Do not return to work or school until your health care provider approves. General instructions  Take over-the-counter and prescription medicines only as told by your health care provider.  To prevent or treat constipation while you are taking prescription pain medicine, your health care provider may recommend that you: ? Drink enough fluid to keep your urine clear or pale yellow. ? Take over-the-counter or prescription medicines. ? Eat foods that are high in fiber, such as fresh fruits and vegetables, whole grains, and beans. ? Limit foods that are high in fat and processed sugars, such as fried and sweet foods. Contact a health care provider if:  You develop a rash.  You have more redness, swelling, or pain around your incisions.  You have more fluid or blood coming from your incisions.  Your incisions feel warm to the touch.  You have pus or a bad smell coming from your incisions.  You have a fever.  One or more of your incisions breaks open. Get help right away if:  You have trouble breathing.  You have chest pain.  You have increasing pain in your shoulders.  You faint or feel dizzy when you stand.  You have severe pain in your abdomen.  You have nausea or vomiting that lasts for more than one day.  You have leg pain. This information is not intended to replace advice given to you by your health care provider. Make sure you discuss any questions you have with your health care provider. Document Released: 09/03/2005 Document Revised: 03/24/2016 Document Reviewed: 02/20/2016 Elsevier Interactive Patient Education  2018 [employer].

## 2018-06-13 NOTE — Op Note (Signed)
Patient:  [name]  DOB:  [birth year]  MRN:  [id number]   Preop Diagnosis: Biliary colic, cholelithiasis  Postop Diagnosis: Same  Procedure: Laparoscopic cholecystectomy  Surgeon: Franky Macho, MD  Anes: General endotracheal  Indications: Patient is a 47 year old white male who presents with biliary colic secondary to cholelithiasis.  The risks and benefits of the procedure including bleeding, infection, hepatobiliary injury, and the possibility of an open procedure were fully explained to the patient, who gave informed consent.  Procedure note: The patient was placed in supine position.  After induction of general endotracheal anesthesia, the abdomen was prepped and draped using the usual sterile technique with DuraPrep.  Surgical site confirmation was performed.  A supraumbilical incision was made down to the fascia.  A Veress needle was introduced into the abdominal cavity and confirmation of placement was done using the saline drop test.  The abdomen was then insufflated to 16 mmHg pressure.  An 11 mm trocar was introduced into the abdominal cavity under direct visualization without difficulty.  The patient was placed in reverse Trendelenburg position and an additional 11 mm trocar was placed in the epigastric region and a 5 mm trochars were placed the right upper quadrant and right flank regions.  The liver was inspected and noted to be within normal limits.  The gallbladder was noted to have 2 large gallstones, 1 of which was impacted in the neck of the gallbladder.  The gallbladder was retracted in a dynamic fashion in order to provide a critical view of the triangle of Calot.  A dome down approach was used in order to facilitate better exposure.  The cystic duct was fully identified.  Its juncture to the infundibulum was fully identified.  I was able to get the impacted stone out of the neck of the gallbladder.  A vascular Endo GIA was placed across the cystic duct and fired.  The  cystic artery was ligated divided using clips.  The gallbladder was then fully removed from the liver using Bovie electrocautery.  The gallbladder was delivered through the epigastric trocar site using an Endo Catch bag.  The gallbladder fossa was inspected and no abnormal bleeding or bile leakage was noted.  The staple I will across the cystic duct was noted to be intact.  Arista and Surgicel were placed in the gallbladder fossa.  All fluid and air were then evacuated from the abdominal cavity prior to removal of the trochars.  All wounds were irrigated with normal saline.  All wounds were injected with Exparel.  The supraumbilical fascia as well as epigastric fascia were reapproximated using 0 Vicryl interrupted sutures.  All skin incisions were closed using staples.  Betadine ointment and dry sterile dressings were applied.  All tape and needle counts were correct at the end of the procedure.  Patient was extubated in the operating room and transferred to PACU in stable condition.  Complications: None  EBL: Minimal  Specimen: Gallbladder

## 2018-06-13 NOTE — Anesthesia Procedure Notes (Signed)
Procedure Name: Intubation Date/Time: 06/13/2018 7:36 AM Performed by: Vista Deck, CRNA Pre-anesthesia Checklist: Patient identified, Patient being monitored, Timeout performed, Emergency Drugs available and Suction available Patient Re-evaluated:Patient Re-evaluated prior to induction Oxygen Delivery Method: Circle System Utilized Preoxygenation: Pre-oxygenation with 100% oxygen Induction Type: IV induction Ventilation: Mask ventilation without difficulty Laryngoscope Size: Mac and 3 Grade View: Grade II Tube type: Oral Tube size: 7.0 mm Number of attempts: 1 Airway Equipment and Method: stylet and Oral airway Placement Confirmation: ETT inserted through vocal cords under direct vision,  positive ETCO2 and breath sounds checked- equal and bilateral Secured at: 21 cm Tube secured with: Tape Dental Injury: Teeth and Oropharynx as per pre-operative assessment

## 2018-06-13 NOTE — Interval H&P Note (Signed)
History and Physical Interval Note:  06/13/2018 7:13 AM  [name]  has presented today for surgery, with the diagnosis of cholelithiasis  The various methods of treatment have been discussed with the patient and family. After consideration of risks, benefits and other options for treatment, the patient has consented to  Procedure(s): LAPAROSCOPIC CHOLECYSTECTOMY (N/A) as a surgical intervention .  The patient's history has been reviewed, patient examined, no change in status, stable for surgery.  I have reviewed the patient's chart and labs.  Questions were answered to the patient's satisfaction.     Franky Macho

## 2018-06-13 NOTE — Anesthesia Postprocedure Evaluation (Signed)
Anesthesia Post Note  Patient: Shean A Fehrenbach  Procedure(s) Performed: LAPAROSCOPIC CHOLECYSTECTOMY (N/A Abdomen)  Patient location during evaluation: Short Stay Anesthesia Type: General Level of consciousness: awake and alert and patient cooperative Pain management: satisfactory to patient Vital Signs Assessment: post-procedure vital signs reviewed and stable Respiratory status: spontaneous breathing Cardiovascular status: stable Postop Assessment: no apparent nausea or vomiting Anesthetic complications: no     Last Vitals:  Vitals:   06/13/18 0910 06/13/18 0911  BP: 112/78 125/82  Pulse: 80 79  Resp: 16   Temp:    SpO2: 95% 92%    Last Pain:  Vitals:   06/13/18 0911  TempSrc:   PainSc: 6                  Zaryia Markel

## 2018-06-13 NOTE — Anesthesia Preprocedure Evaluation (Signed)
Anesthesia Evaluation  Patient identified by MRN, date of birth, ID band Patient awake    Reviewed: Allergy & Precautions, H&P , NPO status , Patient's Chart, lab work & pertinent test results, reviewed documented beta blocker date and time   Airway Mallampati: II  TM Distance: >3 FB Neck ROM: full    Dental no notable dental hx. (+) Teeth Intact, Chipped, Dental Advidsory Given   Pulmonary neg pulmonary ROS, Current Smoker,    Pulmonary exam normal breath sounds clear to auscultation       Cardiovascular Exercise Tolerance: Good negative cardio ROS   Rhythm:regular Rate:Normal     Neuro/Psych negative neurological ROS  negative psych ROS   GI/Hepatic negative GI ROS, Neg liver ROS,   Endo/Other  negative endocrine ROS  Renal/GU negative Renal ROS  negative genitourinary   Musculoskeletal   Abdominal   Peds  Hematology negative hematology ROS (+)   Anesthesia Other Findings Denies any sig PMH  Reproductive/Obstetrics negative OB ROS                             Anesthesia Physical Anesthesia Plan  ASA: II  Anesthesia Plan: General   Post-op Pain Management:    Induction:   PONV Risk Score and Plan:   Airway Management Planned:   Additional Equipment:   Intra-op Plan:   Post-operative Plan:   Informed Consent: I have reviewed the patients History and Physical, chart, labs and discussed the procedure including the risks, benefits and alternatives for the proposed anesthesia with the patient or authorized representative who has indicated his/her understanding and acceptance.   Dental Advisory Given  Plan Discussed with: CRNA and Anesthesiologist  Anesthesia Plan Comments:         Anesthesia Quick Evaluation

## 2018-06-16 ENCOUNTER — Encounter (HOSPITAL_COMMUNITY): Payer: Self-pay | Admitting: General Surgery

## 2018-06-19 ENCOUNTER — Ambulatory Visit (INDEPENDENT_AMBULATORY_CARE_PROVIDER_SITE_OTHER): Payer: Self-pay | Admitting: General Surgery

## 2018-06-19 ENCOUNTER — Encounter: Payer: Self-pay | Admitting: General Surgery

## 2018-06-19 VITALS — BP 130/85 | HR 83 | Temp 98.4°F | Resp 18 | Wt 181.0 lb

## 2018-06-19 DIAGNOSIS — Z09 Encounter for follow-up examination after completed treatment for conditions other than malignant neoplasm: Secondary | ICD-10-CM

## 2018-06-19 MED ORDER — OXYCODONE-ACETAMINOPHEN 7.5-325 MG PO TABS: 1.0000 | ORAL_TABLET | Freq: Four times a day (QID) | ORAL | 0 refills | Status: DC | PRN

## 2018-06-19 NOTE — Progress Notes (Signed)
Subjective:     [name]  Status post laparoscopic cholecystectomy.  Doing well.  Has moderate incisional pain at the epigastric segment.  No fevers, nausea, vomiting. Objective:    BP 130/85 (BP Location: Left Arm, Patient Position: Sitting, Cuff Size: Normal)   Pulse 83   Temp 98.4 F (36.9 C) (Temporal)   Resp 18   Wt 181 lb (82.1 kg)   BMI 23.88 kg/m   General:  alert, cooperative and no distress  Abdomen soft, incisions healing well.  Staples removed, Steri-Strips applied. Final pathology consistent with diagnosis.     Assessment:    Doing well postoperatively.    Plan:   May gradually resume normal activity.  Percocet has been reordered from his moderate incisional pain.  This should resolve with time.  Follow-up as needed.

## 2018-06-24 ENCOUNTER — Other Ambulatory Visit: Payer: Self-pay | Admitting: *Deleted

## 2018-06-24 ENCOUNTER — Other Ambulatory Visit: Payer: Self-pay | Admitting: Family Medicine

## 2018-06-24 ENCOUNTER — Encounter: Payer: Self-pay | Admitting: Family Medicine

## 2018-06-24 ENCOUNTER — Ambulatory Visit (INDEPENDENT_AMBULATORY_CARE_PROVIDER_SITE_OTHER): Payer: Medicare Other | Admitting: Family Medicine

## 2018-06-24 VITALS — BP 110/72 | Ht 73.0 in | Wt 195.0 lb

## 2018-06-24 DIAGNOSIS — R0683 Snoring: Secondary | ICD-10-CM | POA: Diagnosis not present

## 2018-06-24 DIAGNOSIS — R400 Somnolence: Secondary | ICD-10-CM | POA: Diagnosis not present

## 2018-06-24 DIAGNOSIS — Z23 Encounter for immunization: Secondary | ICD-10-CM | POA: Diagnosis not present

## 2018-06-24 DIAGNOSIS — K219 Gastro-esophageal reflux disease without esophagitis: Secondary | ICD-10-CM | POA: Diagnosis not present

## 2018-06-24 MED ORDER — PANTOPRAZOLE SODIUM 40 MG PO TBEC: 40.0000 mg | DELAYED_RELEASE_TABLET | Freq: Every day | ORAL | 3 refills | Status: DC

## 2018-06-24 NOTE — Progress Notes (Signed)
   Subjective:    Patient ID: [name], male    DOB: [birth year], 47 y.o.   MRN: [id number]  HPIpt arrives today to discuss snoring.  He relates a lot of significant snoring at nighttime his partner is with him today she states that he does have some pauses with breathing he does relate a lot of daytime fatigue tiredness and sleepiness denies headaches. Flu vaccine today.   Needs refill on bentyl, oxycodone, and protonix.  On further discussion patient having intermittent epigastric pain and intermittent abdominal pain from where he had the surgery.  I told him that we could refill the Protonix to help him with gastritis I do not feel he needs the dicyclomine currently.  As for the oxycodone I would not recommend that currently but he can discuss this with his surgeon if he chooses  Patient relates some soreness from previous surgery Review of Systems  Constitutional: Negative for activity change.  HENT: Negative for congestion and rhinorrhea.   Respiratory: Negative for cough and shortness of breath.   Cardiovascular: Negative for chest pain.  Gastrointestinal: Positive for abdominal pain. Negative for constipation, diarrhea, nausea and vomiting.  Genitourinary: Negative for dysuria and hematuria.  Neurological: Negative for weakness and headaches.  Psychiatric/Behavioral: Negative for behavioral problems and confusion.       Objective:   Physical Exam  Constitutional: He appears well-nourished. No distress.  HENT:  Head: Normocephalic and atraumatic.  Eyes: Right eye exhibits no discharge. Left eye exhibits no discharge.  Neck: No tracheal deviation present.  Cardiovascular: Normal rate, regular rhythm and normal heart sounds.  No murmur heard. Pulmonary/Chest: Effort normal and breath sounds normal. No respiratory distress.  Abdominal: Soft. He exhibits no distension. There is no tenderness.  Musculoskeletal: He exhibits no edema.  Lymphadenopathy:    He has no cervical  adenopathy.  Neurological: He is alert. Coordination normal.  Skin: Skin is warm and dry.  Psychiatric: He has a normal mood and affect. His behavior is normal.  Vitals reviewed.         Assessment & Plan:  Significant snoring with daytime sleepiness Patient would benefit from a sleep study We will go ahead and initiate this issue  As for his postoperative pain I recommend that he connect with his surgeon We did not refill his pain medicine We did refill reflux medication

## 2018-06-24 NOTE — Progress Notes (Unsigned)
amb ref °

## 2018-06-26 ENCOUNTER — Telehealth: Payer: Self-pay | Admitting: Family Medicine

## 2018-06-26 ENCOUNTER — Other Ambulatory Visit: Payer: Self-pay | Admitting: Family Medicine

## 2018-06-26 DIAGNOSIS — R0683 Snoring: Secondary | ICD-10-CM

## 2018-06-26 NOTE — Telephone Encounter (Signed)
Nurses, please place "order" for pt's sleep study per new protocol ° °Sleep lab will precert if necessary & notify pt  °

## 2018-06-26 NOTE — Telephone Encounter (Signed)
Split protocol if they are able to do so thank you 

## 2018-06-26 NOTE — Telephone Encounter (Signed)
Do you want to order split night sleep study or regular sleep study-Please advise

## 2018-06-26 NOTE — Telephone Encounter (Signed)
Split night sleep study ordered.  

## 2018-07-10 ENCOUNTER — Other Ambulatory Visit: Payer: Self-pay | Admitting: General Surgery

## 2018-07-10 MED ORDER — OXYCODONE-ACETAMINOPHEN 7.5-325 MG PO TABS: 1.0000 | ORAL_TABLET | Freq: Four times a day (QID) | ORAL | 0 refills | Status: DC | PRN

## 2018-07-24 ENCOUNTER — Ambulatory Visit (INDEPENDENT_AMBULATORY_CARE_PROVIDER_SITE_OTHER): Payer: Self-pay | Admitting: General Surgery

## 2018-07-24 ENCOUNTER — Encounter: Payer: Self-pay | Admitting: General Surgery

## 2018-07-24 VITALS — BP 138/81 | HR 71 | Temp 97.8°F | Resp 18 | Wt 191.8 lb

## 2018-07-24 DIAGNOSIS — Z09 Encounter for follow-up examination after completed treatment for conditions other than malignant neoplasm: Secondary | ICD-10-CM

## 2018-07-24 NOTE — Progress Notes (Signed)
Subjective:     [name]  Patient presents for wound check.  He states he had a sharp pain at the epigastric surgical site.  No swelling has been noted.  He currently has no pain present. Objective:    BP 138/81 (BP Location: Left Arm, Patient Position: Sitting, Cuff Size: Normal)   Pulse 71   Temp 97.8 F (36.6 C) (Temporal)   Resp 18   Wt 191 lb 12.8 oz (87 kg)   BMI 25.30 kg/m   General:  alert, cooperative and no distress  Abdomen soft.  All incisions have healed well.  No hernias present the epigastric trocar site.  Nontender.     Assessment:    Doing well postoperatively.    Plan:   Reassured patient that there is no evidence of hernia or infection.  He was thankful to hear that.  Follow-up as needed.

## 2018-09-06 ENCOUNTER — Emergency Department (HOSPITAL_COMMUNITY)
Admission: EM | Admit: 2018-09-06 | Discharge: 2018-09-06 | Disposition: A | Discharge status: Home / Self Care | Payer: Medicare Other | Attending: Emergency Medicine | Admitting: Emergency Medicine

## 2018-09-06 ENCOUNTER — Other Ambulatory Visit: Payer: Self-pay

## 2018-09-06 ENCOUNTER — Encounter (HOSPITAL_COMMUNITY): Payer: Self-pay | Admitting: Emergency Medicine

## 2018-09-06 ENCOUNTER — Emergency Department (HOSPITAL_COMMUNITY): Payer: Medicare Other

## 2018-09-06 DIAGNOSIS — M5442 Lumbago with sciatica, left side: Secondary | ICD-10-CM | POA: Diagnosis not present

## 2018-09-06 DIAGNOSIS — J40 Bronchitis, not specified as acute or chronic: Secondary | ICD-10-CM | POA: Diagnosis not present

## 2018-09-06 DIAGNOSIS — R05 Cough: Secondary | ICD-10-CM | POA: Diagnosis not present

## 2018-09-06 DIAGNOSIS — F17210 Nicotine dependence, cigarettes, uncomplicated: Secondary | ICD-10-CM | POA: Insufficient documentation

## 2018-09-06 MED ORDER — CYCLOBENZAPRINE HCL 10 MG PO TABS
10.0000 mg | ORAL_TABLET | Freq: Once | ORAL | Status: AC
  Administered 2018-09-06: 10 mg via ORAL
  Filled 2018-09-06: qty 1

## 2018-09-06 MED ORDER — IPRATROPIUM-ALBUTEROL 0.5-2.5 (3) MG/3ML IN SOLN
3.0000 mL | Freq: Once | RESPIRATORY_TRACT | Status: AC
  Administered 2018-09-06: 3 mL via RESPIRATORY_TRACT
  Filled 2018-09-06: qty 3

## 2018-09-06 MED ORDER — ALBUTEROL SULFATE HFA 108 (90 BASE) MCG/ACT IN AERS
2.0000 | INHALATION_SPRAY | Freq: Once | RESPIRATORY_TRACT | Status: AC
  Administered 2018-09-06: 2 via RESPIRATORY_TRACT
  Filled 2018-09-06: qty 6.7

## 2018-09-06 MED ORDER — CYCLOBENZAPRINE HCL 10 MG PO TABS: 10.0000 mg | ORAL_TABLET | Freq: Three times a day (TID) | ORAL | 0 refills | Status: DC | PRN

## 2018-09-06 MED ORDER — GUAIFENESIN-CODEINE 100-10 MG/5ML PO SOLN
10.0000 mL | Freq: Once | ORAL | Status: AC
  Administered 2018-09-06: 10 mL via ORAL
  Filled 2018-09-06: qty 10

## 2018-09-06 MED ORDER — HYDROCODONE-ACETAMINOPHEN 5-325 MG PO TABS: ORAL_TABLET | ORAL | 0 refills | Status: DC

## 2018-09-06 MED ORDER — ALBUTEROL SULFATE (2.5 MG/3ML) 0.083% IN NEBU
2.5000 mg | INHALATION_SOLUTION | Freq: Once | RESPIRATORY_TRACT | Status: AC
  Administered 2018-09-06: 2.5 mg via RESPIRATORY_TRACT
  Filled 2018-09-06: qty 3

## 2018-09-06 MED ORDER — PREDNISONE 20 MG PO TABS: 40.0000 mg | ORAL_TABLET | Freq: Every day | ORAL | 0 refills | Status: DC

## 2018-09-06 MED ORDER — PREDNISONE 50 MG PO TABS
60.0000 mg | ORAL_TABLET | Freq: Once | ORAL | Status: AC
  Administered 2018-09-06: 60 mg via ORAL
  Filled 2018-09-06: qty 1

## 2018-09-06 NOTE — Discharge Instructions (Addendum)
2 puffs of your albuterol inhaler 4 times a day as needed while you are sick.  Take the medication as directed.  You may alternate ice and heat to your back if needed.  Follow-up with your primary doctor for recheck.  Return to the ER for any worsening symptoms.

## 2018-09-06 NOTE — ED Notes (Signed)
To Rad 

## 2018-09-06 NOTE — ED Notes (Signed)
resp at bedside

## 2018-09-06 NOTE — ED Triage Notes (Signed)
Pt C/O cough that started 1 week ago. Pt states he has nasal congestion and pressure. Pt denies fever.

## 2018-09-06 NOTE — ED Notes (Signed)
Out of bed to BR 

## 2018-09-06 NOTE — ED Provider Notes (Signed)
[facility] EMERGENCY DEPARTMENT Provider Note   CSN: [id number] Arrival date & time: 09/06/18  2042     History   Chief Complaint Chief Complaint  Patient presents with  . Cough    HPI [name] is a 47 y.o. male.  HPI   [name] is a 47 y.o. male who presents to the Emergency Department complaining of persistent cough that started 1 week ago.  His cough has been nonproductive and associated with nasal congestion and wheezing.  He states that he has coughed excessively and to the point that he now has increased pain to his left lower back.  He describes a sharp stabbing type pain in his lower back when he coughs excessively and also with certain movements.  Occasionally the pain radiates down his left leg.  He states the back pain is similar to previous exacerbations of his chronic pain.  He also endorses several episodes of posttussive emesis.  He denies shortness of breath, fever, chills, abdominal pain, urine or bowel changes, and numbness or weakness of the lower extremities.  He is tried multiple over-the-counter cough and cold medications without relief.    Past Medical History:  Diagnosis Date  . Arthritis   . Chronic back pain   . History of degenerative disc disease   . History of spinal fusion     Patient Active Problem List   Diagnosis Date Noted  . Calculus of gallbladder without cholecystitis without obstruction   . Closed displaced fracture of fourth metacarpal bone of right hand, initial encounter  07/29/17 08/29/2017  . Radicular pain in right arm 04/22/2016  . Hyperlipidemia 10/16/2013  . Allergic rhinitis 01/09/2013  . Chronic low back pain 12/17/2012    Past Surgical History:  Procedure Laterality Date  . 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 CHOLECYSTECTOMY N/A 06/13/2018   Procedure: LAPAROSCOPIC  CHOLECYSTECTOMY;  Surgeon: Franky MachoJenkins, Mark, MD;  Location: [facility];  Service: General;  Laterality: N/A;  . SPINAL FUSION          Home Medications    Prior to Admission medications   Medication Sig Start Date End Date Taking? Authorizing Provider  acetaminophen (TYLENOL) 500 MG tablet Take 1,000-1,500 mg by mouth 2 (two) times daily as needed for moderate pain or headache.    [provider]  dicyclomine (BENTYL) 20 MG tablet Take 1 tablet (20 mg total) by mouth 2 (two) times daily. 06/05/18   Franky MachoJenkins, Mark, MD  diphenhydrAMINE (BENADRYL) 25 MG tablet Take 25 mg by mouth at bedtime as needed for allergies.    [provider]  Naphazoline-Pheniramine (OPCON-A) 0.027-0.315 % SOLN Place 1 drop into both eyes daily as needed (allergies).    [provider]  oxyCODONE-acetaminophen (PERCOCET) 7.5-325 MG tablet Take 1 tablet by mouth every 6 (six) hours as needed for severe pain. 07/10/18 07/10/19  Franky MachoJenkins, Mark, MD  pantoprazole (PROTONIX) 40 MG tablet Take 1 tablet (40 mg total) by mouth daily. 06/24/18   Babs SciaraLuking, Scott A, MD    Family History No family history on file.  Social History Social History   Tobacco Use  . Smoking status: Current Every Day Smoker    Packs/day: 1.00    Types: Cigarettes  . Smokeless tobacco: Never Used  Substance Use Topics  . Alcohol use: No  . Drug use: No     Allergies  Toradol [ketorolac tromethamine]   Review of Systems Review of Systems  Constitutional: Negative for appetite change, chills and fever.  HENT: Positive for congestion. Negative for sore throat and trouble swallowing.   Respiratory: Positive for cough, chest tightness and wheezing. Negative for shortness of breath.   Cardiovascular: Negative for chest pain.  Gastrointestinal: Positive for vomiting (Cough until he vomits). Negative for abdominal pain, constipation and nausea.  Genitourinary: Negative for decreased urine volume, difficulty urinating, dysuria,  flank pain, frequency and hematuria.  Musculoskeletal: Positive for back pain. Negative for arthralgias and joint swelling.  Skin: Negative for rash.  Neurological: Negative for dizziness, weakness and numbness.  Hematological: Negative for adenopathy.     Physical Exam Updated Vital Signs BP 136/81 (BP Location: Right Arm)   Pulse 64   Temp 97.8 F (36.6 C) (Oral)   Resp 20   Ht 6\' 1"  (1.854 m)   Wt 86.2 kg   SpO2 93%   BMI 25.07 kg/m   Physical Exam Vitals signs and nursing note reviewed.  Constitutional:      General: He is not in acute distress.    Appearance: Normal appearance. He is not ill-appearing or toxic-appearing.  HENT:     Head: Atraumatic.     Right Ear: Tympanic membrane and ear canal normal.     Left Ear: Tympanic membrane and ear canal normal.     Nose: Mucosal edema and congestion present.     Mouth/Throat:     Mouth: Mucous membranes are moist.     Pharynx: Oropharynx is clear. Uvula midline. No posterior oropharyngeal erythema or uvula swelling.  Neck:     Musculoskeletal: Normal range of motion.  Cardiovascular:     Rate and Rhythm: Normal rate and regular rhythm.     Pulses: Normal pulses.  Pulmonary:     Breath sounds: Wheezing present.     Comments: Diminished lung sounds bilaterally with expiratory wheezes present throughout Chest:     Chest wall: Tenderness (Mild tenderness to palpation of the bilateral upper chest) present.  Abdominal:     General: There is no distension.     Palpations: Abdomen is soft.     Tenderness: There is no abdominal tenderness. There is no guarding.  Musculoskeletal: Normal range of motion.        General: No swelling.     Comments: Diffuse tenderness to palpation of the lower lumbar spine and left lumbar paraspinal muscles.  positive straight leg raise at 30 degrees on left.  No motor weakness of the bilateral lower extremities.  Skin:    General: Skin is warm.     Findings: No rash.  Neurological:      General: No focal deficit present.     Mental Status: He is alert.     Sensory: No sensory deficit.     Motor: No weakness.     Gait: Gait normal.  Psychiatric:        Mood and Affect: Mood normal.      ED Treatments / Results  Labs (all labs ordered are listed, but only abnormal results are displayed) Labs Reviewed - No data to display  EKG None  Radiology Dg Chest 2 View  Result Date: 09/06/2018 CLINICAL DATA:  Cough EXAM: CHEST - 2 VIEW COMPARISON:  01/29/2010 FINDINGS: No focal pulmonary infiltrate or effusion. Normal cardiomediastinal silhouette. Aortic atherosclerosis. Mild diffuse interstitial prominence could be secondary to interstitial inflammatory process. No pneumothorax. IMPRESSION: No focal pulmonary infiltrate. Mild diffuse interstitial opacity, could  be secondary to interstitial inflammatory process. Electronically Signed   By: Jasmine Pang M.D.   On: 09/06/2018 21:31    Procedures Procedures (including critical care time)  Medications Ordered in ED Medications  cyclobenzaprine (FLEXERIL) tablet 10 mg (has no administration in time range)  ipratropium-albuterol (DUONEB) 0.5-2.5 (3) MG/3ML nebulizer solution 3 mL (3 mLs Nebulization Given 09/06/18 2208)  albuterol (PROVENTIL) (2.5 MG/3ML) 0.083% nebulizer solution 2.5 mg (2.5 mg Nebulization Given 09/06/18 2208)  albuterol (PROVENTIL HFA;VENTOLIN HFA) 108 (90 Base) MCG/ACT inhaler 2 puff (2 puffs Inhalation Given 09/06/18 2208)  predniSONE (DELTASONE) tablet 60 mg (60 mg Oral Given 09/06/18 2151)  guaiFENesin-codeine 100-10 MG/5ML solution 10 mL (10 mLs Oral Given 09/06/18 2150)     Initial Impression / Assessment and Plan / ED Course  I have reviewed the triage vital signs and the nursing notes.  Pertinent labs & imaging results that were available during my care of the patient were reviewed by me and considered in my medical decision making (see chart for details).     Patient with likely bronchitis,  he is well-appearing and nontoxic.  Vital signs reassuring.  On recheck, lung sounds have improved after albuterol neb.  Albuterol MDI dispensed for home use, he agrees to treatment plan with prednisone muscle relaxer and short course of pain medication.  Low back pain is felt to be acute on chronic flare brought on by persistent coughing.  He is ambulatory with a steady gait.  No focal neuro deficits on exam.  Narcotic database reviewed.  Appears appropriate for discharge home and agrees to close PCP follow-up if needed.  Return precautions discussed.  Final Clinical Impressions(s) / ED Diagnoses   Final diagnoses:  Bronchitis  Acute left-sided low back pain with left-sided sciatica    ED Discharge Orders    None       Pauline Aus, PA-C 09/06/18 2257    Bethann Berkshire, MD 09/07/18 1547

## 2018-09-06 NOTE — ED Notes (Signed)
Pt speaks of several weeks of cough   Has not seen physician  States is worse  cig abuser   Also states his back has a pulled muscle from coughing

## 2018-09-06 NOTE — ED Notes (Signed)
TT to evaluate

## 2018-09-08 ENCOUNTER — Emergency Department (HOSPITAL_COMMUNITY)
Admission: EM | Admit: 2018-09-08 | Discharge: 2018-09-08 | Disposition: A | Discharge status: Home / Self Care | Payer: Medicare Other | Attending: Emergency Medicine | Admitting: Emergency Medicine

## 2018-09-08 ENCOUNTER — Other Ambulatory Visit: Payer: Self-pay

## 2018-09-08 ENCOUNTER — Emergency Department (HOSPITAL_COMMUNITY): Payer: Medicare Other

## 2018-09-08 ENCOUNTER — Encounter (HOSPITAL_COMMUNITY): Payer: Self-pay | Admitting: Emergency Medicine

## 2018-09-08 DIAGNOSIS — R05 Cough: Secondary | ICD-10-CM | POA: Diagnosis not present

## 2018-09-08 DIAGNOSIS — J069 Acute upper respiratory infection, unspecified: Secondary | ICD-10-CM

## 2018-09-08 DIAGNOSIS — Z79899 Other long term (current) drug therapy: Secondary | ICD-10-CM | POA: Diagnosis not present

## 2018-09-08 DIAGNOSIS — F17210 Nicotine dependence, cigarettes, uncomplicated: Secondary | ICD-10-CM | POA: Insufficient documentation

## 2018-09-08 DIAGNOSIS — R079 Chest pain, unspecified: Secondary | ICD-10-CM | POA: Diagnosis not present

## 2018-09-08 DIAGNOSIS — B9789 Other viral agents as the cause of diseases classified elsewhere: Secondary | ICD-10-CM | POA: Diagnosis not present

## 2018-09-08 DIAGNOSIS — R0602 Shortness of breath: Secondary | ICD-10-CM | POA: Diagnosis not present

## 2018-09-08 MED ORDER — LIDOCAINE 5 % EX PTCH: 1.0000 | MEDICATED_PATCH | CUTANEOUS | 0 refills | Status: DC

## 2018-09-08 MED ORDER — AZITHROMYCIN 250 MG PO TABS: 250.0000 mg | ORAL_TABLET | Freq: Every day | ORAL | 0 refills | Status: DC

## 2018-09-08 NOTE — ED Triage Notes (Signed)
Pt reports cough and intermittent fever for over one week.

## 2018-09-08 NOTE — Discharge Instructions (Addendum)
If you were given a prescription, please take the prescription as you were instructed and follow the directions given on the discharge paperwork.   You were given a prescription of lidoderm patches for your back pain. If you are unable to afford this prescription you may buy over the counter Salon Pas.   Over the next several days you should rest as much as possible, and drink more fluids than usual. Liquids will help thin and loosen mucus so you can cough it up. Liquids will also help prevent dehydration. Using a cool mist humidifier or a vaporizer to increase air moisture in your home can also make it easier for you to breathe and help decrease your cough.  To help soothe a sore throat gargle with warm salt water.  Make salt water by dissolving  teaspoon salt in 1 cup warm water. You may also use throat lozenges and over the counter sore throat spray.  Please follow up with your primary care provider within 5-7 days for re-evaluation of your symptoms. If you do not have a primary care provider, information for a healthcare clinic has been provided for you to make arrangements for follow up care. Please return to the emergency department for any persistent fevers, worsening sore throat/hoarse voice, inability to swallow, persistent vomiting, chest pain, shortness of breath, coughing up blood, or any new or worsening symptoms.

## 2018-09-08 NOTE — ED Provider Notes (Addendum)
[facility] EMERGENCY DEPARTMENT Provider Note   CSN: [id number] Arrival date & time: 09/08/18  1945     History   Chief Complaint Chief Complaint  Patient presents with  . Cough    HPI [name] is a 47 y.o. male.  HPI  Patient is a 47 year old male with a history of arthritis, chronic back pain, history of spinal fusion, who presents the emergency department today complaining of persistent cough that began 1.5 weeks ago.  Reports cough is nonproductive.  Denies hemoptysis.  Denies shortness of breath or chest pain.  States that he does have some back pain when he coughs.  States he has been coughing so hard that he threw out his back.  Also reports a history of sciatica and states that his bilateral lower extremities are painful.  He also reports rhinorrhea, congestion and a sore throat.  Denies lower extremity swelling or calf pain.  No recent surgeries, hospital admissions or extended periods of travel.  No history of cancer or DVT/PE.  Patient was seen in the emergency department on 09/06/2018 with similar complaints.  At that time he was felt to have a diagnosis of bronchitis and was discharged with symptomatic treatment as well as Norco and Flexeril.    Past Medical History:  Diagnosis Date  . Arthritis   . Chronic back pain   . History of degenerative disc disease   . History of spinal fusion     Patient Active Problem List   Diagnosis Date Noted  . Calculus of gallbladder without cholecystitis without obstruction   . Closed displaced fracture of fourth metacarpal bone of right hand, initial encounter  07/29/17 08/29/2017  . Radicular pain in right arm 04/22/2016  . Hyperlipidemia 10/16/2013  . Allergic rhinitis 01/09/2013  . Chronic low back pain 12/17/2012    Past Surgical History:  Procedure Laterality Date  . 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 CHOLECYSTECTOMY N/A 06/13/2018   Procedure: LAPAROSCOPIC CHOLECYSTECTOMY;  Surgeon: Franky Macho, MD;  Location: [facility];  Service: General;  Laterality: N/A;  . SPINAL FUSION          Home Medications    Prior to Admission medications   Medication Sig Start Date End Date Taking? Authorizing Provider  acetaminophen (TYLENOL) 500 MG tablet Take 1,000-1,500 mg by mouth 2 (two) times daily as needed for moderate pain or headache.    [provider]  azithromycin (ZITHROMAX Z-PAK) 250 MG tablet Take 1 tablet (250 mg total) by mouth daily. Take 2 tablets on the first day of treatment. Then take 1 tablet per day for the next four days. 09/08/18   Bernardina Cacho S, PA-C  cyclobenzaprine (FLEXERIL) 10 MG tablet Take 1 tablet (10 mg total) by mouth 3 (three) times daily as needed. 09/06/18   Triplett, Tammy, PA-C  dicyclomine (BENTYL) 20 MG tablet Take 1 tablet (20 mg total) by mouth 2 (two) times daily. Patient not taking: Reported on 09/06/2018 06/05/18   Franky Macho, MD  HYDROcodone-acetaminophen (NORCO/VICODIN) 5-325 MG tablet Take one tab po q 4 hrs prn pain 09/06/18   Triplett, Tammy, PA-C  lidocaine (LIDODERM) 5 % Place 1 patch onto the skin daily. Remove & Discard patch within 12 hours or as directed by MD 09/08/18   Bohden Dung S, PA-C  oxyCODONE-acetaminophen (PERCOCET) 7.5-325 MG tablet Take 1 tablet by mouth every  6 (six) hours as needed for severe pain. Patient not taking: Reported on 09/06/2018 07/10/18 07/10/19  Franky MachoJenkins, Mark, MD  pantoprazole (PROTONIX) 40 MG tablet Take 1 tablet (40 mg total) by mouth daily. Patient not taking: Reported on 09/06/2018 06/24/18   Babs SciaraLuking, Scott A, MD  predniSONE (DELTASONE) 20 MG tablet Take 2 tablets (40 mg total) by mouth daily. 09/06/18   Pauline Ausriplett, Tammy, PA-C    Family History History reviewed. No pertinent family history.  Social History Social History   Tobacco Use  . Smoking status: Current  Every Day Smoker    Packs/day: 1.00    Types: Cigarettes  . Smokeless tobacco: Never Used  Substance Use Topics  . Alcohol use: No  . Drug use: No     Allergies   Toradol [ketorolac tromethamine]   Review of Systems Review of Systems  Constitutional: Negative for chills and fever.  HENT: Positive for congestion, rhinorrhea and sore throat. Negative for ear pain.   Eyes: Negative for visual disturbance.  Respiratory: Positive for cough. Negative for shortness of breath.   Cardiovascular: Negative for chest pain and leg swelling.  Gastrointestinal: Negative for abdominal pain, constipation, diarrhea, nausea and vomiting.  Genitourinary: Negative for dysuria and hematuria.  Musculoskeletal: Positive for back pain.  Skin: Negative for color change and rash.  Neurological: Negative for weakness and numbness.  All other systems reviewed and are negative.    Physical Exam Updated Vital Signs BP 132/79 (BP Location: Right Arm)   Pulse 84   Temp 97.7 F (36.5 C) (Oral)   Ht 6\' 1"  (1.854 m)   Wt 86.2 kg   SpO2 97%   BMI 25.07 kg/m   Physical Exam Vitals signs and nursing note reviewed.  Constitutional:      Appearance: He is well-developed.  HENT:     Head: Normocephalic and atraumatic.     Ears:     Comments: Bilateral TMs are obstructed by cerumen.    Nose:     Comments: Nasal turbinates swollen bilaterally.    Mouth/Throat:     Mouth: Mucous membranes are moist.     Pharynx: Posterior oropharyngeal erythema present. No oropharyngeal exudate.  Eyes:     Conjunctiva/sclera: Conjunctivae normal.  Neck:     Musculoskeletal: Neck supple.  Cardiovascular:     Rate and Rhythm: Normal rate and regular rhythm.     Heart sounds: No murmur.  Pulmonary:     Effort: Pulmonary effort is normal. No respiratory distress.     Breath sounds: No stridor. No rhonchi.     Comments: Faint end expiratory wheezes.  Diminished lung sounds throughout.  Speaking in full sentences  without tachypnea. Abdominal:     Palpations: Abdomen is soft.     Tenderness: There is no abdominal tenderness.  Musculoskeletal:     Comments: Tenderness to palpation to the bilateral thoracic paraspinous muscles and to the midline lumbar spine.  Skin:    General: Skin is warm and dry.  Neurological:     Mental Status: He is alert.     Comments: Patient ambulating throughout the emergency department with steady gait in no distress.  Normal sensation to the lower extremities.  No lower extremity swelling or calf pain.      ED Treatments / Results  Labs (all labs ordered are listed, but only abnormal results are displayed) Labs Reviewed - No data to display  EKG None  Radiology Dg Chest 2 View  Result Date: 09/08/2018 CLINICAL DATA:  47 year old male  with dry cough and chest pain for 2 weeks. Shortness of breath. Smoker. EXAM: CHEST - 2 VIEW COMPARISON:  09/06/2018 chest radiographs and earlier. FINDINGS: Lung volumes and mediastinal contours remain normal. Mild bilateral pulmonary increased interstitial markings appear stable from 2 days ago, and only mildly increased compared to 2011 radiographs. No pneumothorax or pleural effusion. No confluent pulmonary opacity. Visualized tracheal air column is within normal limits. No acute osseous abnormality identified. Negative visible bowel gas pattern. IMPRESSION: Stable nonspecific increased interstitial markings from chest radiographs 2 days ago with top differential considerations of acute viral/atypical respiratory infection versus chronic/smoking related changes. Electronically Signed   By: Odessa FlemingH  Hall M.D.   On: 09/08/2018 20:30   Dg Chest 2 View  Result Date: 09/06/2018 CLINICAL DATA:  Cough EXAM: CHEST - 2 VIEW COMPARISON:  01/29/2010 FINDINGS: No focal pulmonary infiltrate or effusion. Normal cardiomediastinal silhouette. Aortic atherosclerosis. Mild diffuse interstitial prominence could be secondary to interstitial inflammatory  process. No pneumothorax. IMPRESSION: No focal pulmonary infiltrate. Mild diffuse interstitial opacity, could be secondary to interstitial inflammatory process. Electronically Signed   By: Jasmine PangKim  Fujinaga M.D.   On: 09/06/2018 21:31    Procedures Procedures (including critical care time)  Medications Ordered in ED Medications - No data to display   Initial Impression / Assessment and Plan / ED Course  I have reviewed the triage vital signs and the nursing notes.  Pertinent labs & imaging results that were available during my care of the patient were reviewed by me and considered in my medical decision making (see chart for details).     Final Clinical Impressions(s) / ED Diagnoses   Final diagnoses:  Viral URI with cough   Presents with symptoms of persistent cough.  Was recently diagnosed with bronchitis and has been taking albuterol inhaler at home without significant resolution of his cough.  Chest x-ray today shows stable nonspecific increased interstitial markings suggestive of acute viral/atypical respiratory infection versus chronic smoking related changes.  Given findings on chest x-ray, will treat with antibiotics to cover possible atypical infection.  Patient is also complaining of back pain stating that  He threw out his back.  He has no weakness to the lower extremities and is able to ambulate without difficulty in the ED.  Doubt acute cause of back pain that would require further imaging or intervention at this time.  Suspect that he may have strained a muscle while coughing.  He was given muscle relaxers and pain medications at his last visit.  Will advised Tylenol and give Rx for lidoderm patches for his symptoms.  I also offered him a nebulizer treatment in the ED and he refused.  Flu felt to be less likely given patient is afebrile, his symptoms have also been ongoing for 1.5 weeks and he would not be a candidate for Tamiflu.  Advised him to follow-up with PCP next week for  reevaluation and to return to the ER for new or worsening symptoms in the meantime.  ED Discharge Orders         Ordered    azithromycin (ZITHROMAX Z-PAK) 250 MG tablet  Daily     09/08/18 2111    lidocaine (LIDODERM) 5 %  Every 24 hours     09/08/18 2111           Karrie MeresCouture, Kimber Esterly S, PA-C 09/08/18 2112    Karrie MeresCouture, Masha Orbach S, PA-C 09/08/18 2114    Bethann BerkshireZammit, Joseph, MD 09/08/18 2255

## 2018-09-08 NOTE — ED Notes (Signed)
Patient given discharge instruction, verbalized understand. Patient ambulatory out of the department.  

## 2018-09-12 ENCOUNTER — Ambulatory Visit (INDEPENDENT_AMBULATORY_CARE_PROVIDER_SITE_OTHER): Payer: Medicare Other | Admitting: Family Medicine

## 2018-09-12 ENCOUNTER — Encounter: Payer: Self-pay | Admitting: Family Medicine

## 2018-09-12 VITALS — BP 130/84 | Temp 98.4°F | Ht 73.0 in | Wt 208.0 lb

## 2018-09-12 DIAGNOSIS — J189 Pneumonia, unspecified organism: Secondary | ICD-10-CM | POA: Diagnosis not present

## 2018-09-12 MED ORDER — ALBUTEROL SULFATE HFA 108 (90 BASE) MCG/ACT IN AERS: 2.0000 | INHALATION_SPRAY | Freq: Four times a day (QID) | RESPIRATORY_TRACT | 2 refills | Status: DC | PRN

## 2018-09-12 MED ORDER — HYDROCODONE-ACETAMINOPHEN 10-325 MG PO TABS: 1.0000 | ORAL_TABLET | ORAL | 0 refills | Status: DC | PRN

## 2018-09-12 MED ORDER — CEFTRIAXONE SODIUM 500 MG IJ SOLR
500.0000 mg | Freq: Once | INTRAMUSCULAR | Status: AC
  Administered 2018-09-12: 500 mg via INTRAMUSCULAR

## 2018-09-12 MED ORDER — PREDNISONE 20 MG PO TABS: ORAL_TABLET | ORAL | 0 refills | Status: DC

## 2018-09-12 MED ORDER — AMOXICILLIN-POT CLAVULANATE 875-125 MG PO TABS: 1.0000 | ORAL_TABLET | Freq: Two times a day (BID) | ORAL | 0 refills | Status: DC

## 2018-09-12 NOTE — Progress Notes (Signed)
   Subjective:    Patient ID: [name], male    DOB: 1971/05/26, 47 y.o.   MRN: [id number]  Sinusitis  This is a new problem. Episode onset: one week. Associated symptoms include congestion, coughing and headaches. Pertinent negatives include no chills or ear pain. (Vomiting, abdominal pain, fever, wheezing) Past treatments include acetaminophen.   Sciatica pain down both legs. Tried hot baths and heating pad.  Patient relates severe pain down both legs relates pain is very difficult to get past  Patient also relates is been to the ER couple times these notes were reviewed x-rays reviewed patient states he is not getting any better having chest congestion coughing some wheezing denies high fever chills sweats  Review of Systems  Constitutional: Negative for activity change, chills and fever.  HENT: Positive for congestion and rhinorrhea. Negative for ear pain.   Eyes: Negative for discharge.  Respiratory: Positive for cough. Negative for wheezing.   Cardiovascular: Negative for chest pain.  Gastrointestinal: Negative for nausea and vomiting.  Musculoskeletal: Negative for arthralgias.  Neurological: Positive for headaches.       Objective:   Physical Exam Vitals signs and nursing note reviewed.  Constitutional:      Appearance: He is well-developed.  HENT:     Head: Normocephalic.     Mouth/Throat:     Pharynx: No oropharyngeal exudate.  Neck:     Musculoskeletal: Normal range of motion.  Cardiovascular:     Rate and Rhythm: Normal rate and regular rhythm.     Heart sounds: Normal heart sounds. No murmur.  Pulmonary:     Effort: Pulmonary effort is normal.     Breath sounds: Wheezing present.  Lymphadenopathy:     Cervical: No cervical adenopathy.  Skin:    General: Skin is warm and dry.  Neurological:     Motor: No abnormal muscle tone.     There is some crackles noted more so in the left base compared to the right base  No respiratory distress   O2  saturation 91% Assessment & Plan:  Reactive airway Atypical pneumonia Antibiotics prescribed warning signs discussed follow-up if progressive troubles or if worse Prednisone taper Rocephin shot Antibiotics prescribed Patient counseled to avoid smoking Patient was counseled to follow-up within 7 to 10 days if not doing better sooner if doing worse go to ER if any significant issues  Sciatica pain discomfort will do short-term course of pain medicine but we will not be doing long-term pain medicine if he feels he needs ongoing pain medicine I recommend referral to a pain management center.

## 2018-09-12 NOTE — Patient Instructions (Signed)
Community-Acquired Pneumonia, Adult  Pneumonia is an infection of the lungs. It causes swelling in the airways of the lungs. Mucus and fluid may also build up inside the airways.  One type of pneumonia can happen while a person is in a hospital. A different type can happen when a person is not in a hospital (community-acquired pneumonia).   What are the causes?    This condition is caused by germs (viruses, bacteria, or fungi). Some types of germs can be passed from one person to another. This can happen when you breathe in droplets from the cough or sneeze of an infected person.  What increases the risk?  You are more likely to develop this condition if you:   Have a long-term (chronic) disease, such as:  ? Chronic obstructive pulmonary disease (COPD).  ? Asthma.  ? Cystic fibrosis.  ? Congestive heart failure.  ? Diabetes.  ? Kidney disease.   Have HIV.   Have sickle cell disease.   Have had your spleen removed.   Do not take good care of your teeth and mouth (poor dental hygiene).   Have a medical condition that increases the risk of breathing in droplets from your own mouth and nose.   Have a weakened body defense system (immune system).   Are a smoker.   Travel to areas where the germs that cause this illness are common.   Are around certain animals or the places they live.  What are the signs or symptoms?   A dry cough.   A wet (productive) cough.   Fever.   Sweating.   Chest pain. This often happens when breathing deeply or coughing.   Fast breathing or trouble breathing.   Shortness of breath.   Shaking chills.   Feeling tired (fatigue).   Muscle aches.  How is this treated?  Treatment for this condition depends on many things. Most adults can be treated at home. In some cases, treatment must happen in a hospital. Treatment may include:   Medicines given by mouth or through an IV tube.   Being given extra oxygen.   Respiratory therapy.  In rare cases, treatment for very bad pneumonia  may include:   Using a machine to help you breathe.   Having a procedure to remove fluid from around your lungs.  Follow these instructions at home:  Medicines   Take over-the-counter and prescription medicines only as told by your doctor.  ? Only take cough medicine if you are losing sleep.   If you were prescribed an antibiotic medicine, take it as told by your doctor. Do not stop taking the antibiotic even if you start to feel better.  General instructions     Sleep with your head and neck raised (elevated). You can do this by sleeping in a recliner or by putting a few pillows under your head.   Rest as needed. Get at least 8 hours of sleep each night.   Drink enough water to keep your pee (urine) pale yellow.   Eat a healthy diet that includes plenty of vegetables, fruits, whole grains, low-fat dairy products, and lean protein.   Do not use any products that contain nicotine or tobacco. These include cigarettes, e-cigarettes, and chewing tobacco. If you need help quitting, ask your doctor.   Keep all follow-up visits as told by your doctor. This is important.  How is this prevented?  A shot (vaccine) can help prevent pneumonia. Shots are often suggested for:   People   older than 49 years of age.   People older than 49 years of age who:  ? Are having cancer treatment.  ? Have long-term (chronic) lung disease.  ? Have problems with their body's defense system.  You may also prevent pneumonia if you take these actions:   Get the flu (influenza) shot every year.   Go to the dentist as often as told.   Wash your hands often. If you cannot use soap and water, use hand sanitizer.  Contact a doctor if:   You have a fever.   You lose sleep because your cough medicine does not help.  Get help right away if:   You are short of breath and it gets worse.   You have more chest pain.   Your sickness gets worse. This is very serious if:  ? You are an older adult.  ? Your body's defense system is weak.   You  cough up blood.  Summary   Pneumonia is an infection of the lungs.   Most adults can be treated at home. Some will need treatment in a hospital.   Drink enough water to keep your pee pale yellow.   Get at least 8 hours of sleep each night.  This information is not intended to replace advice given to you by your health care provider. Make sure you discuss any questions you have with your health care provider.  Document Released: 02/20/2008 Document Revised: 05/01/2018 Document Reviewed: 05/01/2018  Elsevier Interactive Patient Education  2019 [employer]

## 2018-09-14 ENCOUNTER — Encounter (HOSPITAL_COMMUNITY): Payer: Self-pay | Admitting: Emergency Medicine

## 2018-09-14 ENCOUNTER — Emergency Department (HOSPITAL_COMMUNITY): Payer: Medicare Other

## 2018-09-14 ENCOUNTER — Emergency Department (HOSPITAL_COMMUNITY)
Admission: EM | Admit: 2018-09-14 | Discharge: 2018-09-14 | Disposition: A | Discharge status: Home / Self Care | Payer: Medicare Other | Attending: Emergency Medicine | Admitting: Emergency Medicine

## 2018-09-14 DIAGNOSIS — J209 Acute bronchitis, unspecified: Secondary | ICD-10-CM | POA: Insufficient documentation

## 2018-09-14 DIAGNOSIS — F17210 Nicotine dependence, cigarettes, uncomplicated: Secondary | ICD-10-CM | POA: Diagnosis not present

## 2018-09-14 DIAGNOSIS — Z79899 Other long term (current) drug therapy: Secondary | ICD-10-CM | POA: Diagnosis not present

## 2018-09-14 DIAGNOSIS — R062 Wheezing: Secondary | ICD-10-CM | POA: Diagnosis present

## 2018-09-14 LAB — BASIC METABOLIC PANEL
ANION GAP: 6 (ref 5–15)
BUN: 14 mg/dL (ref 6–20)
CALCIUM: 9 mg/dL (ref 8.9–10.3)
CO2: 24 mmol/L (ref 22–32)
CREATININE: 0.8 mg/dL (ref 0.61–1.24)
Chloride: 109 mmol/L (ref 98–111)
Glucose, Bld: 98 mg/dL (ref 70–99)
Potassium: 4 mmol/L (ref 3.5–5.1)
SODIUM: 139 mmol/L (ref 135–145)

## 2018-09-14 LAB — CBC WITH DIFFERENTIAL/PLATELET
Abs Immature Granulocytes: 0.36 10*3/uL — ABNORMAL HIGH (ref 0.00–0.07)
BASOS ABS: 0.1 10*3/uL (ref 0.0–0.1)
BASOS PCT: 0 %
EOS ABS: 0.1 10*3/uL (ref 0.0–0.5)
EOS PCT: 0 %
HEMATOCRIT: 43 % (ref 39.0–52.0)
Hemoglobin: 13.6 g/dL (ref 13.0–17.0)
Immature Granulocytes: 2 %
Lymphocytes Relative: 13 %
Lymphs Abs: 2.3 10*3/uL (ref 0.7–4.0)
MCH: 30.4 pg (ref 26.0–34.0)
MCHC: 31.6 g/dL (ref 30.0–36.0)
MCV: 96.2 fL (ref 80.0–100.0)
Monocytes Absolute: 1.1 10*3/uL — ABNORMAL HIGH (ref 0.1–1.0)
Monocytes Relative: 6 %
NEUTROS PCT: 79 %
NRBC: 0 % (ref 0.0–0.2)
Neutro Abs: 14.4 10*3/uL — ABNORMAL HIGH (ref 1.7–7.7)
PLATELETS: 273 10*3/uL (ref 150–400)
RBC: 4.47 MIL/uL (ref 4.22–5.81)
RDW: 14.3 % (ref 11.5–15.5)
WBC: 18.3 10*3/uL — ABNORMAL HIGH (ref 4.0–10.5)

## 2018-09-14 MED ORDER — IPRATROPIUM-ALBUTEROL 0.5-2.5 (3) MG/3ML IN SOLN
3.0000 mL | Freq: Once | RESPIRATORY_TRACT | Status: AC
  Administered 2018-09-14: 3 mL via RESPIRATORY_TRACT
  Filled 2018-09-14: qty 3

## 2018-09-14 MED ORDER — HYDROCOD POLST-CPM POLST ER 10-8 MG/5ML PO SUER
5.0000 mL | Freq: Once | ORAL | Status: AC
  Administered 2018-09-14: 5 mL via ORAL
  Filled 2018-09-14: qty 5

## 2018-09-14 MED ORDER — ALBUTEROL SULFATE (2.5 MG/3ML) 0.083% IN NEBU
2.5000 mg | INHALATION_SOLUTION | Freq: Once | RESPIRATORY_TRACT | Status: AC
  Administered 2018-09-14: 2.5 mg via RESPIRATORY_TRACT
  Filled 2018-09-14: qty 3

## 2018-09-14 MED ORDER — ALBUTEROL (5 MG/ML) CONTINUOUS INHALATION SOLN
7.5000 mg/h | INHALATION_SOLUTION | Freq: Once | RESPIRATORY_TRACT | Status: AC
  Administered 2018-09-14: 7.5 mg/h via RESPIRATORY_TRACT
  Filled 2018-09-14: qty 20

## 2018-09-14 MED ORDER — PROMETHAZINE-DM 6.25-15 MG/5ML PO SYRP: 5.0000 mL | ORAL_SOLUTION | Freq: Four times a day (QID) | ORAL | 0 refills | Status: DC | PRN

## 2018-09-14 NOTE — Discharge Instructions (Addendum)
Continue taking your antibiotics and prednisone as directed until they are finished.  Drink plenty of fluids.  Follow-up with your primary doctor for recheck.  Continue using your albuterol inhaler 2 puffs every 6 hours.

## 2018-09-14 NOTE — ED Triage Notes (Signed)
Pt states that he is having a cough he is hurting allover. Pt states that he has been having fever but has not been taking his fever.

## 2018-09-14 NOTE — ED Notes (Addendum)
Pt coughing with wet cough

## 2018-09-14 NOTE — ED Notes (Signed)
resp in for tx 

## 2018-09-14 NOTE — ED Notes (Signed)
Pt presents with flu like sx  Was seen by his physician and told he had the beginnins of pneumonia and put on an antibiotic which has not helped  Pt is a smoker

## 2018-09-14 NOTE — ED Notes (Signed)
TT in to assess  Labs in to draw  Resp called

## 2018-09-15 ENCOUNTER — Telehealth: Payer: Self-pay | Admitting: Family Medicine

## 2018-09-15 NOTE — Telephone Encounter (Signed)
Patient scheduled office visit tomorrow with Dr Lorin PicketScott for a follow up

## 2018-09-15 NOTE — Telephone Encounter (Signed)
I would recommend a follow-up office visit on Tuesday

## 2018-09-15 NOTE — Telephone Encounter (Signed)
Patient states he thinks he is running a fever,but unable to take his temp as he does not have a thermometer. He states he has been taking Tylenol and Advil to try and help with body aches. He states he is at his wits end with this. He was seen on 09/12/2018 and got no better so he went to the ed yesterday 09/14/2018 and they did not help much either. Please advise.

## 2018-09-15 NOTE — Telephone Encounter (Signed)
Pt seen on 09/12/2018 w/ Dr.Scott for flu like symptoms, pt felt as he wasn't getting any better went to ER on 09/14/2018, pt states he isn't sleeping, slight fever, not eating well, coughing, head aches, body aches, no vomiting. Advise.   Pt would also like HYDROcodone-acetaminophen (NORCO) 10-325 MG tablet and cough medicine called in if possible.   Pharmacy: [facility], [place]

## 2018-09-16 ENCOUNTER — Encounter: Payer: Self-pay | Admitting: Family Medicine

## 2018-09-16 ENCOUNTER — Ambulatory Visit (INDEPENDENT_AMBULATORY_CARE_PROVIDER_SITE_OTHER): Payer: Medicare Other | Admitting: Family Medicine

## 2018-09-16 VITALS — BP 122/80 | Temp 98.3°F | Ht 73.0 in | Wt 200.4 lb

## 2018-09-16 DIAGNOSIS — R062 Wheezing: Secondary | ICD-10-CM | POA: Diagnosis not present

## 2018-09-16 DIAGNOSIS — J4521 Mild intermittent asthma with (acute) exacerbation: Secondary | ICD-10-CM

## 2018-09-16 DIAGNOSIS — R05 Cough: Secondary | ICD-10-CM | POA: Diagnosis not present

## 2018-09-16 DIAGNOSIS — J019 Acute sinusitis, unspecified: Secondary | ICD-10-CM | POA: Diagnosis not present

## 2018-09-16 MED ORDER — PROMETHAZINE-DM 6.25-15 MG/5ML PO SYRP: 5.0000 mL | ORAL_SOLUTION | Freq: Four times a day (QID) | ORAL | 0 refills | Status: DC | PRN

## 2018-09-16 MED ORDER — LEVOFLOXACIN 500 MG PO TABS: 500.0000 mg | ORAL_TABLET | Freq: Every day | ORAL | 0 refills | Status: DC

## 2018-09-16 MED ORDER — ALBUTEROL SULFATE (2.5 MG/3ML) 0.083% IN NEBU: 2.5000 mg | INHALATION_SOLUTION | RESPIRATORY_TRACT | 12 refills | Status: DC | PRN

## 2018-09-16 MED ORDER — ALBUTEROL SULFATE (2.5 MG/3ML) 0.083% IN NEBU
2.5000 mg | INHALATION_SOLUTION | Freq: Once | RESPIRATORY_TRACT | Status: AC
  Administered 2018-09-16: 2.5 mg via RESPIRATORY_TRACT

## 2018-09-16 NOTE — Progress Notes (Addendum)
   Subjective:    Patient ID: [name], male    DOB: 06/29/1971, 47 y.o.   MRN: [id number]  HPIER followup. Pt states he is not feeling any better. Cough, congestion, wheezing. Would like to see about getting neb machine to use at home.    Coughing so hard he states he is having abdominal pain. Had gallbladder surgery in September.   Having panic attacks because he gets scared when he can't breath.   Patient relates he has had a lot of coughing chest congestion wheezing able to bring up only small amounts of phlegm denies any high fevers states the fever seems to have gone down he does state at times he gets short of breath when he is having coughing spells which makes him concerned  Review of Systems  Constitutional: Negative for activity change, chills and fever.  HENT: Positive for congestion and rhinorrhea. Negative for ear pain.   Eyes: Negative for discharge.  Respiratory: Positive for cough, shortness of breath and wheezing.   Cardiovascular: Negative for chest pain.  Gastrointestinal: Negative for nausea and vomiting.  Musculoskeletal: Negative for arthralgias.       Objective:   Physical Exam Vitals signs and nursing note reviewed.  Constitutional:      Appearance: He is well-developed.  HENT:     Head: Normocephalic.     Mouth/Throat:     Pharynx: No oropharyngeal exudate.  Neck:     Musculoskeletal: Normal range of motion.  Cardiovascular:     Rate and Rhythm: Normal rate and regular rhythm.     Heart sounds: Normal heart sounds. No murmur.  Pulmonary:     Effort: Pulmonary effort is normal.     Breath sounds: Wheezing present.  Lymphadenopathy:     Cervical: No cervical adenopathy.  Skin:    General: Skin is warm and dry.  Neurological:     Motor: No abnormal muscle tone.     Patient has a tight congested cough with some scattered expiratory wheezes Patient not respiratory distress O2 saturation before nebulizer treatment 92% after nebulizer  treatment 96% Face-to-face evaluation I believe the patient would benefit from a nebulizer machine because of his severe reactive airway and unresponsive to the inhaler more than likely will just need this nebulizer over the next month but may need it for future flareups    Assessment & Plan:  Recent viral syndrome Secondary bronchitis New antibiotic sent in I do not feel he needs to have repeat x-rays or blood work I do not feel he needs to have repeat prednisone Use albuterol neb treatments every 3-4 hours as needed If not seeing significant improvement over the course of the next several days to follow-up Warning signs discussed Went to go to the ER was discussed as well

## 2018-09-16 NOTE — ED Provider Notes (Signed)
[facility] EMERGENCY DEPARTMENT Provider Note   CSN: [id number] Arrival date & time: 09/14/18  1055     History   Chief Complaint Chief Complaint  Patient presents with  . Influenza    HPI [name] is a 47 y.o. male.  HPI  [name] is a 47 y.o. male who presents to the Emergency Department with recurrent wheezing and cough.  He was seen here 12/21 and again 12/23 for wheezing and cough.  He was also seen by his PCP on 12/27.  He complains of continued symptoms.  He has taken two courses of prednisone and using albuterol inhaler without relief.  He states that his PCP is treating him with Augmentin for pneumonia although two previous XRays here did not show evidence of pneumonia.  He also complains of generalized body aches, increased back pain that he reports as worse from coughing,  and believes he may have had a fever.  He denies chest pain, shortness of breath, vomiting, sore throat and nasal congestion.  He does continue to smoke.     Past Medical History:  Diagnosis Date  . Arthritis   . Chronic back pain   . History of degenerative disc disease   . History of spinal fusion     Patient Active Problem List   Diagnosis Date Noted  . Calculus of gallbladder without cholecystitis without obstruction   . Closed displaced fracture of fourth metacarpal bone of right hand, initial encounter  07/29/17 08/29/2017  . Radicular pain in right arm 04/22/2016  . Hyperlipidemia 10/16/2013  . Allergic rhinitis 01/09/2013  . Chronic low back pain 12/17/2012    Past Surgical History:  Procedure Laterality Date  . 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 CHOLECYSTECTOMY N/A 06/13/2018   Procedure: LAPAROSCOPIC CHOLECYSTECTOMY;  Surgeon: Franky MachoJenkins, Mark, MD;  Location: [facility];  Service: General;  Laterality: N/A;  . SPINAL FUSION        Home  Medications    Prior to Admission medications   Medication Sig Start Date End Date Taking? Authorizing Provider  acetaminophen (TYLENOL) 500 MG tablet Take 1,000-1,500 mg by mouth 2 (two) times daily as needed for moderate pain or headache.    [provider]  albuterol (PROVENTIL HFA;VENTOLIN HFA) 108 (90 Base) MCG/ACT inhaler Inhale 2 puffs into the lungs every 6 (six) hours as needed for wheezing. 09/12/18   Babs SciaraLuking, Scott A, MD  albuterol (PROVENTIL) (2.5 MG/3ML) 0.083% nebulizer solution Take 3 mLs (2.5 mg total) by nebulization every 4 (four) hours as needed for wheezing. 09/16/18   Babs SciaraLuking, Scott A, MD  cyclobenzaprine (FLEXERIL) 10 MG tablet Take 1 tablet (10 mg total) by mouth 3 (three) times daily as needed. 09/06/18   Shylo Dillenbeck, PA-C  dicyclomine (BENTYL) 20 MG tablet Take 1 tablet (20 mg total) by mouth 2 (two) times daily. 06/05/18   Franky MachoJenkins, Mark, MD  HYDROcodone-acetaminophen [facility](NORCO) 10-325 MG tablet Take 1 tablet by mouth every 4 (four) hours as needed for up to 5 days. 09/12/18 09/17/18  Babs SciaraLuking, Scott A, MD  levofloxacin (LEVAQUIN) 500 MG tablet Take 1 tablet (500 mg total) by mouth daily. 09/16/18   Babs SciaraLuking, Scott A, MD  pantoprazole (PROTONIX) 40 MG tablet Take 1 tablet (40 mg total) by mouth daily. 06/24/18   Babs SciaraLuking, Scott A, MD  promethazine-dextromethorphan (PROMETHAZINE-DM) 6.25-15 MG/5ML syrup Take 5 mLs by  mouth 4 (four) times daily as needed for cough. 09/16/18   Babs SciaraLuking, Scott A, MD    Family History History reviewed. No pertinent family history.  Social History Social History   Tobacco Use  . Smoking status: Current Every Day Smoker    Packs/day: 1.00    Types: Cigarettes  . Smokeless tobacco: Never Used  Substance Use Topics  . Alcohol use: No  . Drug use: No     Allergies   Toradol [ketorolac tromethamine]   Review of Systems Review of Systems  Constitutional: Positive for fever. Negative for appetite change and chills.  HENT: Negative for  congestion, sore throat and trouble swallowing.   Respiratory: Positive for cough, chest tightness and wheezing. Negative for shortness of breath.   Cardiovascular: Negative for chest pain.  Gastrointestinal: Negative for abdominal pain, nausea and vomiting.  Genitourinary: Negative for decreased urine volume and dysuria.  Musculoskeletal: Positive for back pain and myalgias. Negative for arthralgias.  Skin: Negative for rash.  Neurological: Negative for dizziness, weakness and numbness.  Hematological: Negative for adenopathy.     Physical Exam Updated Vital Signs BP 101/78   Pulse 71   Temp 98 F (36.7 C)   Resp (!) 23   Ht 6\' 1"  (1.854 m)   Wt 86.2 kg   SpO2 94%   BMI 25.07 kg/m   Physical Exam Vitals signs and nursing note reviewed.  Constitutional:      General: He is not in acute distress.    Appearance: He is well-developed. He is not ill-appearing or toxic-appearing.  HENT:     Head: Normocephalic and atraumatic.     Right Ear: Tympanic membrane and ear canal normal.     Left Ear: Tympanic membrane and ear canal normal.     Nose: No congestion.     Mouth/Throat:     Mouth: Mucous membranes are moist.     Pharynx: Oropharynx is clear. Uvula midline. No oropharyngeal exudate.  Neck:     Musculoskeletal: Full passive range of motion without pain, normal range of motion and neck supple. No muscular tenderness.     Trachea: Phonation normal.  Cardiovascular:     Rate and Rhythm: Normal rate and regular rhythm.     Pulses: Normal pulses.     Heart sounds: Normal heart sounds. No murmur.  Pulmonary:     Effort: Pulmonary effort is normal. No respiratory distress.     Breath sounds: No stridor. Wheezing present. No rales.     Comments: Audible wheezing throughout all lung fields.  Pt able to speak in full and complete sentences without respiratory distress. Chest:     Chest wall: No tenderness.  Abdominal:     General: There is no distension.     Palpations:  Abdomen is soft.  Musculoskeletal: Normal range of motion.        General: No swelling.  Lymphadenopathy:     Cervical: No cervical adenopathy.  Skin:    General: Skin is warm.  Neurological:     General: No focal deficit present.     Mental Status: He is alert.     Sensory: No sensory deficit.     Motor: No abnormal muscle tone.  Psychiatric:        Mood and Affect: Mood normal.      ED Treatments / Results  Labs (all labs ordered are listed, but only abnormal results are displayed) Labs Reviewed  CBC WITH DIFFERENTIAL/PLATELET - Abnormal; Notable for the following components:  Result Value   WBC 18.3 (*)    Neutro Abs 14.4 (*)    Monocytes Absolute 1.1 (*)    Abs Immature Granulocytes 0.36 (*)    All other components within normal limits  BASIC METABOLIC PANEL    EKG None  Radiology No results found.  Procedures Procedures (including critical care time)  Medications Ordered in ED Medications  ipratropium-albuterol (DUONEB) 0.5-2.5 (3) MG/3ML nebulizer solution 3 mL (3 mLs Nebulization Given 09/14/18 1235)  albuterol (PROVENTIL) (2.5 MG/3ML) 0.083% nebulizer solution 2.5 mg (2.5 mg Nebulization Given 09/14/18 1235)  albuterol (PROVENTIL,VENTOLIN) solution continuous neb (7.5 mg/hr Nebulization Given 09/14/18 1349)  chlorpheniramine-HYDROcodone (TUSSIONEX) 10-8 MG/5ML suspension 5 mL (5 mLs Oral Given 09/14/18 1516)     Initial Impression / Assessment and Plan / ED Course  I have reviewed the triage vital signs and the nursing notes.  Pertinent labs & imaging results that were available during my care of the patient were reviewed by me and considered in my medical decision making (see chart for details).     Pt with chronic cough and wheezing, continues to smoke.  Recently seen here x 2 and currently taking abx by his PCP and has had two courses of prednisone (has one day left) no fever here, no hypoxia or respiratory distress on exam.  Will give albuterol  neb and reassess.    On recheck, lung sounds improving, but wheezing still present.  No hypoxia or tachycardia.  Will order continuous neb.    After continuous neb, lung sounds are greatly improved.  Ambulated in the dept with O2 sat remaining at 94% on RA.  Elevated WBC's is felt to be related to prednisone use.  No fever or clinical indications for sepsis or PNA.  Pt has had steroids and 2 recent CXR's.  I do not feel that repeat steroids or imaging is warranted.  Pt will continue his abx, albuterol MDI and rx for anti-tussive.  He has f/u appt with PCP this week.  Strict return precautions discussed and smoking cessation encouraged.    Final Clinical Impressions(s) / ED Diagnoses   Final diagnoses:  Acute bronchitis with bronchospasm    ED Discharge Orders         Ordered    promethazine-dextromethorphan (PROMETHAZINE-DM) 6.25-15 MG/5ML syrup  4 times daily PRN,   Status:  Discontinued     09/14/18 1526           Pauline Aus, PA-C 09/16/18 1734    Vanetta Mulders, MD 09/17/18 1625

## 2018-09-19 ENCOUNTER — Telehealth: Payer: Self-pay | Admitting: Emergency Medicine

## 2018-09-19 NOTE — Telephone Encounter (Signed)
erro  neous encounter

## 2018-09-22 ENCOUNTER — Telehealth: Payer: Self-pay | Admitting: Family Medicine

## 2018-09-22 DIAGNOSIS — G8929 Other chronic pain: Secondary | ICD-10-CM

## 2018-09-22 NOTE — Telephone Encounter (Signed)
Referral ordered in EPIC. 

## 2018-09-22 NOTE — Telephone Encounter (Signed)
Please go ahead with referral thank you 

## 2018-09-22 NOTE — Telephone Encounter (Signed)
Patient called requesting referral to Dr. Gerilyn Pilgrim for pain management   We referred him back in 2017  Please advise & initiate referral in system so that I may process

## 2018-09-29 ENCOUNTER — Encounter: Payer: Self-pay | Admitting: Family Medicine

## 2018-10-07 ENCOUNTER — Ambulatory Visit (INDEPENDENT_AMBULATORY_CARE_PROVIDER_SITE_OTHER): Payer: Medicare Other | Admitting: Family Medicine

## 2018-10-07 ENCOUNTER — Encounter: Payer: Self-pay | Admitting: Family Medicine

## 2018-10-07 VITALS — BP 130/88 | Wt 199.8 lb

## 2018-10-07 DIAGNOSIS — E785 Hyperlipidemia, unspecified: Secondary | ICD-10-CM

## 2018-10-07 DIAGNOSIS — R400 Somnolence: Secondary | ICD-10-CM | POA: Diagnosis not present

## 2018-10-07 DIAGNOSIS — R0683 Snoring: Secondary | ICD-10-CM

## 2018-10-07 DIAGNOSIS — Z125 Encounter for screening for malignant neoplasm of prostate: Secondary | ICD-10-CM | POA: Diagnosis not present

## 2018-10-07 MED ORDER — DIAZEPAM 5 MG PO TABS: ORAL_TABLET | ORAL | 1 refills | Status: DC

## 2018-10-07 NOTE — Progress Notes (Signed)
   Subjective:    Patient ID: [name], male    DOB: [birth year], 48 y.o.   MRN: [id number]  HPI Pt here today for 3 week recheck from 09/16/18. Pt was seen for mild intermittent reactive airway disease. Pt was not unable to get neb machine due to cost. Pt states he has been having back pain. Pt states that he is also coughing and that has made it worse.  He relates intermittent congestion coughing but doing better breathing better using albuterol occasionally still smokes but has cut back  Has been trying to get in with pain management.  He states he is have a lot of muscle spasms wonders if he can use some Valium   Review of Systems  Constitutional: Negative for activity change, chills and fever.  HENT: Negative for congestion, ear pain and rhinorrhea.   Eyes: Negative for discharge.  Respiratory: Positive for cough. Negative for wheezing.   Cardiovascular: Negative for chest pain.  Gastrointestinal: Negative for nausea and vomiting.  Musculoskeletal: Negative for arthralgias.       Objective:   Physical Exam Vitals signs and nursing note reviewed.  Constitutional:      Appearance: He is well-developed.  HENT:     Head: Normocephalic.     Mouth/Throat:     Pharynx: No oropharyngeal exudate.  Neck:     Musculoskeletal: Normal range of motion.  Cardiovascular:     Rate and Rhythm: Normal rate and regular rhythm.     Heart sounds: Normal heart sounds. No murmur.  Pulmonary:     Effort: Pulmonary effort is normal.     Breath sounds: Normal breath sounds. No wheezing.  Lymphadenopathy:     Cervical: No cervical adenopathy.  Skin:    General: Skin is warm and dry.  Neurological:     Motor: No abnormal muscle tone.           Assessment & Plan:  Patient relates back pain with back spasms-he will be seen pain management May use Valium on a sparing basis Once he is on pain management I recommend stopping the Valium Valium is not for long-term use Caution  drowsiness  Daytime somnolence sleepiness I recommend sleep study  Pulmonary doing better patient encouraged to quit smoking  Lab work and wellness by early June

## 2018-10-10 ENCOUNTER — Other Ambulatory Visit: Payer: Self-pay | Admitting: Family Medicine

## 2018-10-12 NOTE — Telephone Encounter (Signed)
We will refuse this.  It is not good for this patient to be on ongoing Valium He has been referred to pain management

## 2018-10-24 ENCOUNTER — Ambulatory Visit (INDEPENDENT_AMBULATORY_CARE_PROVIDER_SITE_OTHER): Payer: Medicare Other | Admitting: Family Medicine

## 2018-10-24 ENCOUNTER — Encounter: Payer: Self-pay | Admitting: Family Medicine

## 2018-10-24 VITALS — Ht 73.0 in | Wt 196.6 lb

## 2018-10-24 DIAGNOSIS — G542 Cervical root disorders, not elsewhere classified: Secondary | ICD-10-CM | POA: Diagnosis not present

## 2018-10-24 MED ORDER — DIAZEPAM 5 MG PO TABS: ORAL_TABLET | ORAL | 0 refills | Status: DC

## 2018-10-24 MED ORDER — PREDNISONE 20 MG PO TABS: ORAL_TABLET | ORAL | 0 refills | Status: DC

## 2018-10-24 NOTE — Progress Notes (Signed)
   Subjective:    Patient ID: [name], male    DOB: 1971/08/20, 48 y.o.   MRN: [id number]  HPI Patient arrives with left shoulder pain for 3 days. Patient recalls no known injury. Pain radiates down the arm.  Bad pain in the neck and shoulder and out into the hand  Pt has had sinilar pain in the past   Using   Bio freeze and boltaren cream   Tylenol did not help     Positive history of lumbar fusion.  Positive history of degenerative both in the low back and neck.  This has not similar aspects.  Patient also notes Valium as prescribed per Dr. Roby Lofts last visit seen 2 weeks ago has helped his spasms considerably.  Requests a refill on this medicine      Review of Systems No headache, no major weight loss or weight gain, no chest pain no back pain abdominal pain no change in bowel habits complete ROS otherwise negative     Objective:   Physical Exam Alert vitals stable, NAD. Blood pressure good on repeat. HEENT normal. Lungs clear. Heart regular rate and rhythm. Left shoulder decent range of motion distal hand some diminished sensation finger to more the ulnar distribution. Pain with lateral rotation      Assessment & Plan:  Impression cervical neuralgia.  Discussed.  Recommend prednisone taper.  Rationale discussed.  Valium refilled x1 per patient request.  States it is helping considerably with back spasm.  Encouraged once again not to maintain if he starts narcotics via the pain specialist  Due to see neurologist in 2 weeks so can follow-up with him in this regard also

## 2018-11-05 DIAGNOSIS — M545 Low back pain: Secondary | ICD-10-CM | POA: Diagnosis not present

## 2018-11-05 DIAGNOSIS — G894 Chronic pain syndrome: Secondary | ICD-10-CM | POA: Diagnosis not present

## 2018-11-05 DIAGNOSIS — M79605 Pain in left leg: Secondary | ICD-10-CM | POA: Diagnosis not present

## 2018-11-05 DIAGNOSIS — M542 Cervicalgia: Secondary | ICD-10-CM | POA: Diagnosis not present

## 2018-11-05 DIAGNOSIS — R208 Other disturbances of skin sensation: Secondary | ICD-10-CM | POA: Diagnosis not present

## 2018-11-20 DIAGNOSIS — M461 Sacroiliitis, not elsewhere classified: Secondary | ICD-10-CM | POA: Diagnosis not present

## 2018-11-20 DIAGNOSIS — M545 Low back pain: Secondary | ICD-10-CM | POA: Diagnosis not present

## 2018-11-28 DIAGNOSIS — Z029 Encounter for administrative examinations, unspecified: Secondary | ICD-10-CM

## 2018-12-04 DIAGNOSIS — G603 Idiopathic progressive neuropathy: Secondary | ICD-10-CM | POA: Diagnosis not present

## 2018-12-04 DIAGNOSIS — M5412 Radiculopathy, cervical region: Secondary | ICD-10-CM | POA: Diagnosis not present

## 2018-12-13 ENCOUNTER — Encounter (HOSPITAL_COMMUNITY): Payer: Self-pay | Admitting: Emergency Medicine

## 2018-12-13 ENCOUNTER — Other Ambulatory Visit: Payer: Self-pay

## 2018-12-13 ENCOUNTER — Emergency Department (HOSPITAL_COMMUNITY)
Admission: EM | Admit: 2018-12-13 | Discharge: 2018-12-13 | Disposition: A | Discharge status: Home / Self Care | Payer: Medicare Other | Attending: Emergency Medicine | Admitting: Emergency Medicine

## 2018-12-13 DIAGNOSIS — Z79899 Other long term (current) drug therapy: Secondary | ICD-10-CM | POA: Diagnosis not present

## 2018-12-13 DIAGNOSIS — M542 Cervicalgia: Secondary | ICD-10-CM | POA: Diagnosis not present

## 2018-12-13 DIAGNOSIS — G542 Cervical root disorders, not elsewhere classified: Secondary | ICD-10-CM | POA: Diagnosis not present

## 2018-12-13 DIAGNOSIS — M5489 Other dorsalgia: Secondary | ICD-10-CM | POA: Diagnosis present

## 2018-12-13 DIAGNOSIS — F17210 Nicotine dependence, cigarettes, uncomplicated: Secondary | ICD-10-CM | POA: Insufficient documentation

## 2018-12-13 DIAGNOSIS — M5412 Radiculopathy, cervical region: Secondary | ICD-10-CM | POA: Diagnosis not present

## 2018-12-13 MED ORDER — ETODOLAC 300 MG PO CAPS: 300.0000 mg | ORAL_CAPSULE | Freq: Three times a day (TID) | ORAL | 0 refills | Status: DC

## 2018-12-13 MED ORDER — METHOCARBAMOL 500 MG PO TABS: 500.0000 mg | ORAL_TABLET | Freq: Two times a day (BID) | ORAL | 0 refills | Status: DC

## 2018-12-13 MED ORDER — PREDNISONE 10 MG (21) PO TBPK: ORAL_TABLET | ORAL | 0 refills | Status: AC

## 2018-12-13 MED ORDER — MORPHINE SULFATE (PF) 4 MG/ML IV SOLN
4.0000 mg | Freq: Once | INTRAVENOUS | Status: AC
  Administered 2018-12-13: 4 mg via INTRAMUSCULAR
  Filled 2018-12-13: qty 1

## 2018-12-13 NOTE — Discharge Instructions (Signed)
Take the medications as prescribed, follow-up with your doctor if the symptoms are persisting

## 2018-12-13 NOTE — ED Provider Notes (Signed)
[facility] EMERGENCY DEPARTMENT Provider Note   CSN: [id number] Arrival date & time: 12/13/18  2007    History   Chief Complaint Chief Complaint  Patient presents with  . Back Pain    HPI [name] is a 48 y.o. male.     HPI Patient planes of pain in his neck and upper back.  Patient states the symptoms have been going on for about a month.  He has tried various medications including steroids and muscle relaxants.  Still has been taking a lot of Tylenol as well as NSAIDs.  Patient states he finished his course of steroids.  He continues to have the pain.  He thinks it might be exacerbated by his increased activity at work.  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  Patient has some tingling in the fingers of both hands.  He denies any weakness.  No fevers or chills.  No chest pain or shortness of breath.  Does have a history of prior spinal fusion. Past Medical History:  Diagnosis Date  . Arthritis   . Chronic back pain   . History of degenerative disc disease   . History of spinal fusion     Patient Active Problem List   Diagnosis Date Noted  . Calculus of gallbladder without cholecystitis without obstruction   . Closed displaced fracture of fourth metacarpal bone of right hand, initial encounter  07/29/17 08/29/2017  . Radicular pain in right arm 04/22/2016  . Hyperlipidemia 10/16/2013  . Allergic rhinitis 01/09/2013  . Chronic low back pain 12/17/2012    Past Surgical History:  Procedure Laterality Date  . 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 CHOLECYSTECTOMY N/A 06/13/2018   Procedure: LAPAROSCOPIC CHOLECYSTECTOMY;  Surgeon: Franky Macho, MD;  Location: [facility];  Service: General;  Laterality: N/A;  . SPINAL FUSION          Home Medications    Prior to Admission medications   Medication Sig Start Date End  Date Taking? Authorizing Provider  acetaminophen (TYLENOL) 500 MG tablet Take 1,000-1,500 mg by mouth 2 (two) times daily as needed for moderate pain or headache.    [provider]  albuterol (PROVENTIL HFA;VENTOLIN HFA) 108 (90 Base) MCG/ACT inhaler Inhale 2 puffs into the lungs every 6 (six) hours as needed for wheezing. 09/12/18   Babs Sciara, MD  albuterol (PROVENTIL) (2.5 MG/3ML) 0.083% nebulizer solution Take 3 mLs (2.5 mg total) by nebulization every 4 (four) hours as needed for wheezing. Patient not taking: Reported on 10/07/2018 09/16/18   Babs Sciara, MD  cyclobenzaprine (FLEXERIL) 10 MG tablet Take 1 tablet (10 mg total) by mouth 3 (three) times daily as needed. Patient not taking: Reported on 10/07/2018 09/06/18   Pauline Aus, PA-C  diazepam (VALIUM) 5 MG tablet 1 bid prn 10/24/18   Merlyn Albert, MD  dicyclomine (BENTYL) 20 MG tablet Take 1 tablet (20 mg total) by mouth 2 (two) times daily. Patient not taking: Reported on 10/07/2018 06/05/18   Franky Macho, MD  etodolac (LODINE) 300 MG capsule Take 1 capsule (300 mg total) by mouth every 8 (eight) hours. 12/13/18   Linwood Dibbles, MD  methocarbamol (ROBAXIN) 500 MG tablet Take 1 tablet (500 mg total) by mouth 2 (two) times daily. 12/13/18   Linwood Dibbles, MD  pantoprazole (PROTONIX) 40 MG tablet  Take 1 tablet (40 mg total) by mouth daily. Patient not taking: Reported on 10/07/2018 06/24/18   Babs Sciara, MD  predniSONE (STERAPRED UNI-PAK 21 TAB) 10 MG (21) TBPK tablet Take 6 tablets (60 mg total) by mouth daily for 2 days, THEN 5 tablets (50 mg total) daily for 2 days, THEN 4 tablets (40 mg total) daily for 2 days, THEN 3 tablets (30 mg total) daily for 2 days, THEN 2 tablets (20 mg total) daily for 2 days, THEN 1 tablet (10 mg total) daily for 2 days. 12/13/18 12/25/18  Linwood Dibbles, MD  promethazine-dextromethorphan (PROMETHAZINE-DM) 6.25-15 MG/5ML syrup Take 5 mLs by mouth 4 (four) times daily as needed for cough. Patient not  taking: Reported on 10/07/2018 09/16/18   Babs Sciara, MD    Family History No family history on file.  Social History Social History   Tobacco Use  . Smoking status: Current Every Day Smoker    Packs/day: 1.00    Types: Cigarettes  . Smokeless tobacco: Never Used  Substance Use Topics  . Alcohol use: No  . Drug use: No     Allergies   Toradol [ketorolac tromethamine]   Review of Systems Review of Systems  All other systems reviewed and are negative.    Physical Exam Updated Vital Signs BP (!) 142/95 (BP Location: Right Arm)   Pulse 78   Temp 97.9 F (36.6 C) (Oral)   Resp 17   Ht 1.854 m (6\' 1" )   Wt 93 kg   SpO2 95%   BMI 27.05 kg/m   Physical Exam Vitals signs and nursing note reviewed.  Constitutional:      General: He is not in acute distress.    Appearance: He is well-developed.  HENT:     Head: Normocephalic and atraumatic.     Right Ear: External ear normal.     Left Ear: External ear normal.  Eyes:     General: No scleral icterus.       Right eye: No discharge.        Left eye: No discharge.     Conjunctiva/sclera: Conjunctivae normal.  Neck:     Musculoskeletal: Neck supple. No neck rigidity.     Trachea: No tracheal deviation.  Cardiovascular:     Rate and Rhythm: Normal rate and regular rhythm.  Pulmonary:     Effort: Pulmonary effort is normal. No respiratory distress.     Breath sounds: Normal breath sounds. No stridor.  Abdominal:     General: There is no distension.  Musculoskeletal:        General: No swelling or deformity.     Cervical back: He exhibits spasm.     Comments: Paraspinal muscle tenderness in the cervical and upper thoracic spine, no erythema, no edema  Skin:    General: Skin is warm and dry.     Findings: No rash.  Neurological:     Mental Status: He is alert.     Cranial Nerves: Cranial nerve deficit: no gross deficits.     Comments: Normal sensation bilaterally, normal grip strength bilaterally       ED Treatments / Results  Labs (all labs ordered are listed, but only abnormal results are displayed) Labs Reviewed - No data to display  EKG None  Radiology No results found.  Procedures Procedures (including critical care time)  Medications Ordered in ED Medications  morphine 4 MG/ML injection 4 mg (has no administration in time range)     Initial  Impression / Assessment and Plan / ED Course  I have reviewed the triage vital signs and the nursing notes.  Pertinent labs & imaging results that were available during my care of the patient were reviewed by me and considered in my medical decision making (see chart for details).   Patient has a history of chronic neck pain trouble.  He saw his primary doctor back in February for similar symptoms.  According to the records he is going to see a pain specialist.  On exam the patient has no focal neurologic or vascular deficits.  I doubt any acute neurovascular emergency.  Plan on discharge with prescription for steroids and NSAIDs as well as muscle relaxants.   Final Clinical Impressions(s) / ED Diagnoses   Final diagnoses:  Cervical neuropathy    ED Discharge Orders         Ordered    predniSONE (STERAPRED UNI-PAK 21 TAB) 10 MG (21) TBPK tablet     12/13/18 2025    etodolac (LODINE) 300 MG capsule  Every 8 hours    Note to Pharmacy:  As needed for pain   12/13/18 2025    methocarbamol (ROBAXIN) 500 MG tablet  2 times daily     12/13/18 2025           Linwood Dibbles, MD 12/13/18 2025

## 2018-12-13 NOTE — ED Triage Notes (Signed)
Pt with c/o pinched nerve in back x 3 weeks.

## 2018-12-21 ENCOUNTER — Emergency Department (HOSPITAL_COMMUNITY)
Admission: EM | Admit: 2018-12-21 | Discharge: 2018-12-21 | Disposition: A | Discharge status: Home / Self Care | Payer: Medicare Other | Attending: Emergency Medicine | Admitting: Emergency Medicine

## 2018-12-21 ENCOUNTER — Encounter (HOSPITAL_COMMUNITY): Payer: Self-pay | Admitting: *Deleted

## 2018-12-21 ENCOUNTER — Other Ambulatory Visit: Payer: Self-pay

## 2018-12-21 DIAGNOSIS — M5412 Radiculopathy, cervical region: Secondary | ICD-10-CM | POA: Insufficient documentation

## 2018-12-21 DIAGNOSIS — F17210 Nicotine dependence, cigarettes, uncomplicated: Secondary | ICD-10-CM | POA: Diagnosis not present

## 2018-12-21 DIAGNOSIS — Z79899 Other long term (current) drug therapy: Secondary | ICD-10-CM | POA: Insufficient documentation

## 2018-12-21 DIAGNOSIS — M79622 Pain in left upper arm: Secondary | ICD-10-CM | POA: Diagnosis present

## 2018-12-21 MED ORDER — DIAZEPAM 5 MG PO TABS
5.0000 mg | ORAL_TABLET | Freq: Once | ORAL | Status: AC
  Administered 2018-12-21: 5 mg via ORAL
  Filled 2018-12-21: qty 1

## 2018-12-21 MED ORDER — HYDROCODONE-ACETAMINOPHEN 5-325 MG PO TABS: 1.0000 | ORAL_TABLET | ORAL | 0 refills | Status: DC | PRN

## 2018-12-21 MED ORDER — HYDROCODONE-ACETAMINOPHEN 5-325 MG PO TABS
1.0000 | ORAL_TABLET | Freq: Once | ORAL | Status: AC
  Administered 2018-12-21: 17:00:00 1 via ORAL
  Filled 2018-12-21: qty 1

## 2018-12-21 MED ORDER — DIAZEPAM 5 MG PO TABS: ORAL_TABLET | ORAL | 0 refills | Status: DC

## 2018-12-21 NOTE — ED Triage Notes (Signed)
Pt c/o numbness down left arm for a month. Pt worked out in yard yesterday and pain is worse in left shoulder and numbness down both arms.

## 2018-12-21 NOTE — ED Provider Notes (Signed)
[facility] EMERGENCY DEPARTMENT Provider Note   CSN: [id number] Arrival date & time: 12/21/18  1641    History   Chief Complaint Chief Complaint  Patient presents with  . Shoulder Pain    HPI [name] is a 48 y.o. male.     Pt presents to the ED today with bilateral arm pain and left hand numbness.  Pt has had problems with cervical radiculopathy for months.  He is scheduled to see a pain specialist in a few weeks.  Due to covid, his appt has been pushed back.  He works at [employer] and they have been very busy due to the covid.  He feels like he's been lifting things that have been too heavy.  Pt denies any f/c.  No sob or cough.     Past Medical History:  Diagnosis Date  . Arthritis   . Chronic back pain   . History of degenerative disc disease   . History of spinal fusion     Patient Active Problem List   Diagnosis Date Noted  . Calculus of gallbladder without cholecystitis without obstruction   . Closed displaced fracture of fourth metacarpal bone of right hand, initial encounter  07/29/17 08/29/2017  . Radicular pain in right arm 04/22/2016  . Hyperlipidemia 10/16/2013  . Allergic rhinitis 01/09/2013  . Chronic low back pain 12/17/2012    Past Surgical History:  Procedure Laterality Date  . 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 CHOLECYSTECTOMY N/A 06/13/2018   Procedure: LAPAROSCOPIC CHOLECYSTECTOMY;  Surgeon: Franky Macho, MD;  Location: [facility];  Service: General;  Laterality: N/A;  . SPINAL FUSION          Home Medications    Prior to Admission medications   Medication Sig Start Date End Date Taking? Authorizing Provider  acetaminophen (TYLENOL) 500 MG tablet Take 1,000-1,500 mg by mouth 2 (two) times daily as needed for moderate pain or headache.    [provider]  albuterol (PROVENTIL HFA;VENTOLIN  HFA) 108 (90 Base) MCG/ACT inhaler Inhale 2 puffs into the lungs every 6 (six) hours as needed for wheezing. 09/12/18   Babs Sciara, MD  albuterol (PROVENTIL) (2.5 MG/3ML) 0.083% nebulizer solution Take 3 mLs (2.5 mg total) by nebulization every 4 (four) hours as needed for wheezing. Patient not taking: Reported on 10/07/2018 09/16/18   Babs Sciara, MD  cyclobenzaprine (FLEXERIL) 10 MG tablet Take 1 tablet (10 mg total) by mouth 3 (three) times daily as needed. Patient not taking: Reported on 10/07/2018 09/06/18   Pauline Aus, PA-C  diazepam (VALIUM) 5 MG tablet 1 bid prn 12/21/18   Jacalyn Lefevre, MD  dicyclomine (BENTYL) 20 MG tablet Take 1 tablet (20 mg total) by mouth 2 (two) times daily. Patient not taking: Reported on 10/07/2018 06/05/18   Franky Macho, MD  etodolac (LODINE) 300 MG capsule Take 1 capsule (300 mg total) by mouth every 8 (eight) hours. 12/13/18   Linwood Dibbles, MD  HYDROcodone-acetaminophen (NORCO/VICODIN) 5-325 MG tablet Take 1 tablet by mouth every 4 (four) hours as needed. 12/21/18   Jacalyn Lefevre, MD  methocarbamol (ROBAXIN) 500 MG tablet Take 1 tablet (500 mg total) by mouth 2 (two) times daily. 12/13/18   Linwood Dibbles, MD  pantoprazole (PROTONIX) 40 MG tablet Take 1 tablet (40 mg total) by mouth daily. Patient not taking: Reported on 10/07/2018 06/24/18  Babs Sciara, MD  predniSONE (STERAPRED UNI-PAK 21 TAB) 10 MG (21) TBPK tablet Take 6 tablets (60 mg total) by mouth daily for 2 days, THEN 5 tablets (50 mg total) daily for 2 days, THEN 4 tablets (40 mg total) daily for 2 days, THEN 3 tablets (30 mg total) daily for 2 days, THEN 2 tablets (20 mg total) daily for 2 days, THEN 1 tablet (10 mg total) daily for 2 days. 12/13/18 12/25/18  Linwood Dibbles, MD  promethazine-dextromethorphan (PROMETHAZINE-DM) 6.25-15 MG/5ML syrup Take 5 mLs by mouth 4 (four) times daily as needed for cough. Patient not taking: Reported on 10/07/2018 09/16/18   Babs Sciara, MD    Family History  History reviewed. No pertinent family history.  Social History Social History   Tobacco Use  . Smoking status: Current Every Day Smoker    Packs/day: 1.00    Types: Cigarettes  . Smokeless tobacco: Never Used  Substance Use Topics  . Alcohol use: No  . Drug use: No     Allergies   Toradol [ketorolac tromethamine]   Review of Systems Review of Systems  Musculoskeletal:       Radicular pain both arms, L>R  Neurological: Positive for numbness.  All other systems reviewed and are negative.    Physical Exam Updated Vital Signs BP 116/81 (BP Location: Right Arm)   Pulse 90   Temp (!) 96.5 F (35.8 C) (Oral)   Resp 17   Ht 6\' 1"  (1.854 m)   Wt 91.6 kg   SpO2 95%   BMI 26.65 kg/m   Physical Exam Vitals signs and nursing note reviewed.  Constitutional:      Appearance: Normal appearance.  HENT:     Head: Normocephalic and atraumatic.     Right Ear: External ear normal.     Left Ear: External ear normal.     Nose: Nose normal.     Mouth/Throat:     Mouth: Mucous membranes are moist.  Eyes:     Extraocular Movements: Extraocular movements intact.     Pupils: Pupils are equal, round, and reactive to light.  Neck:     Musculoskeletal: Normal range of motion.  Cardiovascular:     Rate and Rhythm: Normal rate and regular rhythm.     Pulses: Normal pulses.     Heart sounds: Normal heart sounds.  Pulmonary:     Effort: Pulmonary effort is normal.     Breath sounds: Normal breath sounds.  Abdominal:     General: Abdomen is flat. Bowel sounds are normal.     Palpations: Abdomen is soft.  Musculoskeletal: Normal range of motion.  Skin:    General: Skin is warm.     Capillary Refill: Capillary refill takes less than 2 seconds.  Neurological:     Mental Status: He is alert and oriented to person, place, and time.     Comments: Numbness to left 3rd, 4th, and 5th fingers  Psychiatric:        Mood and Affect: Mood normal.      ED Treatments / Results  Labs  (all labs ordered are listed, but only abnormal results are displayed) Labs Reviewed - No data to display  EKG None  Radiology No results found.  Procedures Procedures (including critical care time)  Medications Ordered in ED Medications  diazepam (VALIUM) tablet 5 mg (5 mg Oral Given 12/21/18 1712)  HYDROcodone-acetaminophen (NORCO/VICODIN) 5-325 MG per tablet 1 tablet (1 tablet Oral Given 12/21/18 1713)  Initial Impression / Assessment and Plan / ED Course  I have reviewed the triage vital signs and the nursing notes.  Pertinent labs & imaging results that were available during my care of the patient were reviewed by me and considered in my medical decision making (see chart for details).     Pt's issue is a chronic one.  He was here on 3/28 for the same.  He was given steroids which have not helped.  He has not had a rx for narcotics since December.  No valium since 2/7.  I will give him a short course of both so he can get back to work.  Return if worse.  F/u with pcp.  Final Clinical Impressions(s) / ED Diagnoses   Final diagnoses:  Cervical radiculopathy    ED Discharge Orders         Ordered    diazepam (VALIUM) 5 MG tablet     12/21/18 1701    HYDROcodone-acetaminophen (NORCO/VICODIN) 5-325 MG tablet  Every 4 hours PRN     12/21/18 1701           Jacalyn Lefevre, MD 12/21/18 1723

## 2018-12-21 NOTE — ED Notes (Signed)
Pt reports L shoulder pain "for months"  Worse for the last "3 weeks"  Here for eval

## 2018-12-24 ENCOUNTER — Ambulatory Visit (INDEPENDENT_AMBULATORY_CARE_PROVIDER_SITE_OTHER): Payer: Medicare Other | Admitting: Family Medicine

## 2018-12-24 ENCOUNTER — Telehealth: Payer: Self-pay | Admitting: Family Medicine

## 2018-12-24 ENCOUNTER — Other Ambulatory Visit: Payer: Self-pay

## 2018-12-24 DIAGNOSIS — K047 Periapical abscess without sinus: Secondary | ICD-10-CM

## 2018-12-24 MED ORDER — HYDROCODONE-ACETAMINOPHEN 10-325 MG PO TABS: 1.0000 | ORAL_TABLET | ORAL | 0 refills | Status: DC | PRN

## 2018-12-24 MED ORDER — PENICILLIN V POTASSIUM 500 MG PO TABS: 500.0000 mg | ORAL_TABLET | Freq: Four times a day (QID) | ORAL | 0 refills | Status: DC

## 2018-12-24 NOTE — Telephone Encounter (Signed)
Tried to contact patient. No voicemail set up 

## 2018-12-24 NOTE — Progress Notes (Signed)
   Subjective:    Patient ID: [name], male    DOB: 02-04-1971, 48 y.o.   MRN: [id number] Telephone visit Patient home I was present in the office Video not possible   Dental Pain   This is a new problem. Episode onset: couple days ago. Associated symptoms include a fever. Associated symptoms comments: Gum swollen. He has tried acetaminophen and NSAIDs (garling with salt water, oragel) for the symptoms. The treatment provided mild relief.   Pt states dentist is unavailable until next week. Tooth pain is on right bottom side close to back of mouth.  Patient relates bottom tooth broke off having significant pain discomfort unable to sleep because of it relates soreness around the tooth thinks it is infected.  Had a little bit of low-grade fever last night no chills no wheezing difficulty breathing  Review of Systems  Constitutional: Positive for fever.       Objective:   Physical Exam  This was a telephone visit video not possible for this patient 15 minutes spent with the patient greater than half in discussion of the health problem and what to do for      Assessment & Plan:  Abscessed tooth antibiotics pain medicine warning signs discussed follow-up if progressive troubles or worse

## 2018-12-24 NOTE — Telephone Encounter (Signed)
Pt requesting antibiotic and pain medication. Please advise. Thank you

## 2018-12-24 NOTE — Telephone Encounter (Signed)
Pt returned call and has phone visit with provider.

## 2018-12-24 NOTE — Telephone Encounter (Signed)
Telephone visit it can be this morning

## 2018-12-24 NOTE — Telephone Encounter (Signed)
Patient is requesting something for tooth pain he states dentist cant fix him in until next week but all dentist offices are closed. He went to Er on 4/5 for shoulder pain and ER doctor gave him 10 hydrocodone 5/325 pills. Please Advise.

## 2018-12-25 NOTE — Telephone Encounter (Signed)
Appointment 4/8.

## 2018-12-29 ENCOUNTER — Other Ambulatory Visit: Payer: Self-pay | Admitting: Family Medicine

## 2018-12-30 ENCOUNTER — Other Ambulatory Visit: Payer: Self-pay | Admitting: Family Medicine

## 2018-12-30 MED ORDER — PENICILLIN V POTASSIUM 500 MG PO TABS: 500.0000 mg | ORAL_TABLET | Freq: Four times a day (QID) | ORAL | 0 refills | Status: DC

## 2018-12-30 MED ORDER — HYDROCODONE-ACETAMINOPHEN 10-325 MG PO TABS: 1.0000 | ORAL_TABLET | ORAL | 0 refills | Status: AC | PRN

## 2018-12-30 NOTE — Telephone Encounter (Signed)
I sent in these refills these may be deleted as duplicate

## 2019-01-10 ENCOUNTER — Encounter (HOSPITAL_COMMUNITY): Payer: Self-pay

## 2019-01-10 ENCOUNTER — Emergency Department (HOSPITAL_COMMUNITY)
Admission: EM | Admit: 2019-01-10 | Discharge: 2019-01-10 | Disposition: A | Discharge status: Home / Self Care | Payer: Medicare Other | Attending: Emergency Medicine | Admitting: Emergency Medicine

## 2019-01-10 ENCOUNTER — Other Ambulatory Visit: Payer: Self-pay

## 2019-01-10 DIAGNOSIS — M5412 Radiculopathy, cervical region: Secondary | ICD-10-CM

## 2019-01-10 DIAGNOSIS — F17210 Nicotine dependence, cigarettes, uncomplicated: Secondary | ICD-10-CM | POA: Insufficient documentation

## 2019-01-10 DIAGNOSIS — R202 Paresthesia of skin: Secondary | ICD-10-CM | POA: Diagnosis not present

## 2019-01-10 DIAGNOSIS — M25512 Pain in left shoulder: Secondary | ICD-10-CM | POA: Diagnosis present

## 2019-01-10 LAB — COMPREHENSIVE METABOLIC PANEL
ALT: 34 U/L (ref 0–44)
AST: 23 U/L (ref 15–41)
Albumin: 4.2 g/dL (ref 3.5–5.0)
Alkaline Phosphatase: 61 U/L (ref 38–126)
Anion gap: 9 (ref 5–15)
BUN: 12 mg/dL (ref 6–20)
CO2: 24 mmol/L (ref 22–32)
Calcium: 9.5 mg/dL (ref 8.9–10.3)
Chloride: 108 mmol/L (ref 98–111)
Creatinine, Ser: 0.93 mg/dL (ref 0.61–1.24)
GFR calc Af Amer: >60 mL/min (ref >60)
GFR calc non Af Amer: >60 mL/min (ref >60)
Glucose, Bld: 136 mg/dL — ABNORMAL HIGH (ref 70–99)
Potassium: 3.5 mmol/L (ref 3.5–5.1)
Sodium: 141 mmol/L (ref 135–145)
Total Bilirubin: 0.4 mg/dL (ref 0.3–1.2)
Total Protein: 6.8 g/dL (ref 6.5–8.1)

## 2019-01-10 LAB — CBC WITH DIFFERENTIAL/PLATELET
Abs Immature Granulocytes: 0.04 10*3/uL (ref 0.00–0.07)
Basophils Absolute: 0.1 10*3/uL (ref 0.0–0.1)
Basophils Relative: 1 %
Eosinophils Absolute: 0.2 10*3/uL (ref 0.0–0.5)
Eosinophils Relative: 3 %
HCT: 45.5 % (ref 39.0–52.0)
Hemoglobin: 14.8 g/dL (ref 13.0–17.0)
Immature Granulocytes: 1 %
Lymphocytes Relative: 23 %
Lymphs Abs: 1.8 10*3/uL (ref 0.7–4.0)
MCH: 31.6 pg (ref 26.0–34.0)
MCHC: 32.5 g/dL (ref 30.0–36.0)
MCV: 97.2 fL (ref 80.0–100.0)
Monocytes Absolute: 0.5 10*3/uL (ref 0.1–1.0)
Monocytes Relative: 6 %
Neutro Abs: 5.1 10*3/uL (ref 1.7–7.7)
Neutrophils Relative %: 66 %
Platelets: 214 10*3/uL (ref 150–400)
RBC: 4.68 MIL/uL (ref 4.22–5.81)
RDW: 13.3 % (ref 11.5–15.5)
WBC: 7.6 10*3/uL (ref 4.0–10.5)
nRBC: 0 % (ref 0.0–0.2)

## 2019-01-10 LAB — ACETAMINOPHEN LEVEL: Acetaminophen (Tylenol), Serum: <10 ug/mL — ABNORMAL LOW (ref 10–30)

## 2019-01-10 MED ORDER — DEXAMETHASONE SODIUM PHOSPHATE 10 MG/ML IJ SOLN
10.0000 mg | Freq: Once | INTRAMUSCULAR | Status: AC
  Administered 2019-01-10: 09:00:00 10 mg via INTRAMUSCULAR
  Filled 2019-01-10: qty 1

## 2019-01-10 NOTE — Discharge Instructions (Signed)
We have given you a medication that helps to reduce the swelling on the nerve that is causing your pain however if you continue to have severe pain or develop numbness or weakness you may need to come back to the emergency department for repeat evaluation.  Otherwise please follow-up with the providers listed above  Please do not take any more than 1000 mg of Tylenol every 6 hours as needed.  Taking more than this can cause liver failure and even death

## 2019-01-10 NOTE — ED Provider Notes (Signed)
[facility] EMERGENCY DEPARTMENT Provider Note   CSN: [id number] Arrival date & time: 01/10/19  0813    History   Chief Complaint Chief Complaint  Patient presents with  . Shoulder Pain    HPI [name] is a 48 y.o. male.     HPI  The patient is a 48 year old male, he has a history of degenerative disc disease, history of spinal fusion and chronic back pain, he also has a history of radicular pain in his right arm however over the last month he has been complaining of pain in his left arm, it starts in his neck and radiates into his shoulder, down the posterior upper arm into the volar forearm and then into the third fourth and fifth digit which she states are also slightly numb.  This is been present for over a month, he is struggled with this intermittently in his past and his last spinal surgery was in 2006 with Dr. Wynetta Emerycram.  He reports that he is followed by Dr. Gerda DissLuking, family doctor.  He does not have a spinal surgeon at this time and is not in a pain clinic per his report.  He reports allergies to Toradol, he is not a diabetic.  Review of the medical record shows that the patient had been seen on April 5 for a cervical radiculopathy, at that time he had bilateral arm pain and left hand numbness.  At that time he had reported that he was scheduled to see a pain specialist within a couple of weeks.  The patient states he has not seen them.  The patient reports to me that he has been taking Tylenol, sometimes up to 2000 mg 4 or 5 times per day, he reports that that is the only thing that seems to help and it really does not do that much.  He has also been putting topical BenGay on this and has been using hot compresses and warm baths.  Ultimately he continues to have pain.  He reports that this seems to get worse when he lifts heavy boxes which is what he does at work.  He denies any symptoms in his legs, denies fevers.  Past Medical History:  Diagnosis Date  . Arthritis   .  Chronic back pain   . History of degenerative disc disease   . History of spinal fusion     Patient Active Problem List   Diagnosis Date Noted  . Calculus of gallbladder without cholecystitis without obstruction   . Closed displaced fracture of fourth metacarpal bone of right hand, initial encounter  07/29/17 08/29/2017  . Radicular pain in right arm 04/22/2016  . Hyperlipidemia 10/16/2013  . Allergic rhinitis 01/09/2013  . Chronic low back pain 12/17/2012    Past Surgical History:  Procedure Laterality Date  . 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 CHOLECYSTECTOMY N/A 06/13/2018   Procedure: LAPAROSCOPIC CHOLECYSTECTOMY;  Surgeon: Franky MachoJenkins, Mark, MD;  Location: [facility];  Service: General;  Laterality: N/A;  . SPINAL FUSION          Home Medications    Prior to Admission medications   Medication Sig Start Date End Date Taking? Authorizing Provider  albuterol (PROVENTIL HFA;VENTOLIN HFA) 108 (90 Base) MCG/ACT inhaler Inhale 2 puffs into the lungs every 6 (six) hours as needed for wheezing. Patient not taking: Reported on 12/24/2018 09/12/18   Lilyan PuntLuking, Scott  A, MD  albuterol (PROVENTIL) (2.5 MG/3ML) 0.083% nebulizer solution Take 3 mLs (2.5 mg total) by nebulization every 4 (four) hours as needed for wheezing. Patient not taking: Reported on 10/07/2018 09/16/18   Babs Sciara, MD  cyclobenzaprine (FLEXERIL) 10 MG tablet Take 1 tablet (10 mg total) by mouth 3 (three) times daily as needed. Patient not taking: Reported on 10/07/2018 09/06/18   Pauline Aus, PA-C  diazepam (VALIUM) 5 MG tablet 1 bid prn 12/21/18   Jacalyn Lefevre, MD  dicyclomine (BENTYL) 20 MG tablet Take 1 tablet (20 mg total) by mouth 2 (two) times daily. Patient not taking: Reported on 10/07/2018 06/05/18   Franky Macho, MD  etodolac (LODINE) 300 MG capsule Take 1 capsule (300 mg total)  by mouth every 8 (eight) hours. Patient not taking: Reported on 12/24/2018 12/13/18   Linwood Dibbles, MD  methocarbamol (ROBAXIN) 500 MG tablet Take 1 tablet (500 mg total) by mouth 2 (two) times daily. Patient not taking: Reported on 12/24/2018 12/13/18   Linwood Dibbles, MD  pantoprazole (PROTONIX) 40 MG tablet Take 1 tablet (40 mg total) by mouth daily. Patient not taking: Reported on 10/07/2018 06/24/18   Babs Sciara, MD  penicillin v potassium (VEETID) 500 MG tablet Take 1 tablet (500 mg total) by mouth 4 (four) times daily. 12/30/18   Babs Sciara, MD  promethazine-dextromethorphan (PROMETHAZINE-DM) 6.25-15 MG/5ML syrup Take 5 mLs by mouth 4 (four) times daily as needed for cough. Patient not taking: Reported on 10/07/2018 09/16/18   Babs Sciara, MD    Family History No family history on file.  Social History Social History   Tobacco Use  . Smoking status: Current Every Day Smoker    Packs/day: 1.00    Types: Cigarettes  . Smokeless tobacco: Never Used  Substance Use Topics  . Alcohol use: No  . Drug use: No     Allergies   Toradol [ketorolac tromethamine]   Review of Systems Review of Systems  Constitutional: Negative for fever.  Musculoskeletal: Positive for neck pain.  Skin: Negative for rash.  Neurological: Positive for numbness.     Physical Exam Updated Vital Signs BP 107/80   Pulse 82   Temp 97.8 F (36.6 C) (Oral)   Resp 20   Ht 1.88 m ( )   Wt 92.1 kg   SpO2 95%   BMI 26.06 kg/m   Physical Exam Constitutional:      General: He is not in acute distress.    Appearance: He is well-developed. He is not diaphoretic.  HENT:     Head: Normocephalic and atraumatic.  Eyes:     General: No scleral icterus.       Right eye: No discharge.        Left eye: No discharge.     Conjunctiva/sclera: Conjunctivae normal.  Cardiovascular:     Rate and Rhythm: Normal rate and regular rhythm.  Pulmonary:     Effort: Pulmonary effort is normal.     Breath  sounds: Normal breath sounds.  Musculoskeletal:     Comments: Tenderness of the back over the paraspinal muscles of the upper thoracic spine, there is no tenderness over the cervical spine No tenderness over the Cervical, Thoracic or Lumbar Spine  Skin:    General: Skin is warm and dry.     Findings: No rash.  Neurological:     Comments: The patient does have some decreased sensation to the left hand over the third fourth and fifth digits.  He is able to fully abduct his fingers, abduct his fingers, he is able to flex and extend at the elbow wrist and hand.  There is no definable weakness.  He does have some give out pain on the left upper extremity.  Right upper extremity and bilateral legs with normal strength and sensation.  The patient speech is clear, his coordination is normal, his gait was normal which I witnessed on his way from the waiting room to the treatment room.      ED Treatments / Results  Labs (all labs ordered are listed, but only abnormal results are displayed) Labs Reviewed  ACETAMINOPHEN LEVEL - Abnormal; Notable for the following components:      Result Value   Acetaminophen (Tylenol), Serum <10 (*)    All other components within normal limits  COMPREHENSIVE METABOLIC PANEL - Abnormal; Notable for the following components:   Glucose, Bld 136 (*)    All other components within normal limits  CBC WITH DIFFERENTIAL/PLATELET    EKG None  Radiology No results found.  Procedures Procedures (including critical care time)  Medications Ordered in ED Medications  dexamethasone (DECADRON) injection 10 mg (10 mg Intramuscular Given 01/10/19 0902)     Initial Impression / Assessment and Plan / ED Course  I have reviewed the triage vital signs and the nursing notes.  Pertinent labs & imaging results that were available during my care of the patient were reviewed by me and considered in my medical decision making (see chart for details).       At this time I  see no signs of acute new neurologic dysfunction.  He does have what appears to be a radiculopathy especially affecting his left upper extremity.  This may be C6-C7-C8 type area.  He will receive a Decadron shot.  Review of the [place] drug database shows that he has filled 3 different prescriptions for opiate medications in the last 3 weeks as well as multiple prescriptions for Valium  The patient was given a dose of Decadron, his labs were unremarkable, my interpretation is that there is been no permanent liver damage, the acetaminophen level was undetectable in the bloodstream and I counseled the patient at length regarding his use of Tylenol and how this can be severe and even life-threatening if he uses too much.  He expresses understanding.  We will avoid opiate medications on discharge given his history of multiple prescriptions, he has an allergy to anti-inflammatories.  He was given the follow-up information for neurosurgery on-call.  Final Clinical Impressions(s) / ED Diagnoses   Final diagnoses:  Cervical radiculopathy    ED Discharge Orders    None       Eber Hong, MD 01/10/19 [phone]

## 2019-01-10 NOTE — ED Triage Notes (Signed)
Pt reports has chronic left shoulder pain due to pinched nerve.  Reports pain has been present for the past month.  Denies recent injury.

## 2019-01-25 ENCOUNTER — Other Ambulatory Visit: Payer: Self-pay

## 2019-01-25 ENCOUNTER — Emergency Department (HOSPITAL_COMMUNITY)
Admission: EM | Admit: 2019-01-25 | Discharge: 2019-01-25 | Disposition: A | Discharge status: Home / Self Care | Payer: Medicare Other | Attending: Emergency Medicine | Admitting: Emergency Medicine

## 2019-01-25 ENCOUNTER — Encounter (HOSPITAL_COMMUNITY): Payer: Self-pay | Admitting: Emergency Medicine

## 2019-01-25 DIAGNOSIS — M545 Low back pain: Secondary | ICD-10-CM | POA: Diagnosis not present

## 2019-01-25 DIAGNOSIS — M549 Dorsalgia, unspecified: Secondary | ICD-10-CM

## 2019-01-25 DIAGNOSIS — F17210 Nicotine dependence, cigarettes, uncomplicated: Secondary | ICD-10-CM | POA: Diagnosis not present

## 2019-01-25 DIAGNOSIS — M546 Pain in thoracic spine: Secondary | ICD-10-CM | POA: Diagnosis not present

## 2019-01-25 DIAGNOSIS — G8929 Other chronic pain: Secondary | ICD-10-CM

## 2019-01-25 DIAGNOSIS — M5442 Lumbago with sciatica, left side: Secondary | ICD-10-CM | POA: Insufficient documentation

## 2019-01-25 MED ORDER — DIAZEPAM 5 MG PO TABS: 5.0000 mg | ORAL_TABLET | Freq: Two times a day (BID) | ORAL | 0 refills | Status: DC

## 2019-01-25 MED ORDER — HYDROCODONE-ACETAMINOPHEN 5-325 MG PO TABS
1.0000 | ORAL_TABLET | Freq: Once | ORAL | Status: AC
  Administered 2019-01-25: 1 via ORAL
  Filled 2019-01-25: qty 1

## 2019-01-25 MED ORDER — PREDNISONE 10 MG (21) PO TBPK: ORAL_TABLET | ORAL | 0 refills | Status: DC

## 2019-01-25 MED ORDER — HYDROCODONE-ACETAMINOPHEN 5-325 MG PO TABS: 1.0000 | ORAL_TABLET | ORAL | 0 refills | Status: DC | PRN

## 2019-01-25 MED ORDER — DEXAMETHASONE SODIUM PHOSPHATE 10 MG/ML IJ SOLN
10.0000 mg | Freq: Once | INTRAMUSCULAR | Status: AC
  Administered 2019-01-25: 22:00:00 10 mg via INTRAMUSCULAR
  Filled 2019-01-25: qty 1

## 2019-01-25 MED ORDER — DIAZEPAM 5 MG PO TABS
5.0000 mg | ORAL_TABLET | Freq: Once | ORAL | Status: AC
  Administered 2019-01-25: 22:00:00 5 mg via ORAL
  Filled 2019-01-25: qty 1

## 2019-01-25 NOTE — ED Triage Notes (Signed)
Pt with chronic pain to shoulder  Complaint tonight is shoulder and low back pain for the last several days   Was in a pain clinic years ago but quit- gave hime spinal injections

## 2019-01-25 NOTE — ED Provider Notes (Addendum)
[facility] EMERGENCY DEPARTMENT Provider Note   CSN: [id number] Arrival date & time: 01/25/19  2206    History   Chief Complaint Chief Complaint  Patient presents with  . Back Pain    HPI [name] is a 48 y.o. male.     Pt presents to the ED today with left upper and lower back pain.  The pain has been going on for years.  The pt has not been to the pain clinic in years.  The pt said he's not been able to sleep due to the pain.  He has numbness in both hands and in his left leg.  This is also not new.  Pt denies any trouble walking.  No bowel or bladder problems.     Past Medical History:  Diagnosis Date  . Arthritis   . Chronic back pain   . History of degenerative disc disease   . History of spinal fusion     Patient Active Problem List   Diagnosis Date Noted  . Calculus of gallbladder without cholecystitis without obstruction   . Closed displaced fracture of fourth metacarpal bone of right hand, initial encounter  07/29/17 08/29/2017  . Radicular pain in right arm 04/22/2016  . Hyperlipidemia 10/16/2013  . Allergic rhinitis 01/09/2013  . Chronic low back pain 12/17/2012    Past Surgical History:  Procedure Laterality Date  . 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 CHOLECYSTECTOMY N/A 06/13/2018   Procedure: LAPAROSCOPIC CHOLECYSTECTOMY;  Surgeon: Franky Macho, MD;  Location: [facility];  Service: General;  Laterality: N/A;  . SPINAL FUSION          Home Medications    Prior to Admission medications   Medication Sig Start Date End Date Taking? Authorizing Provider  albuterol (PROVENTIL HFA;VENTOLIN HFA) 108 (90 Base) MCG/ACT inhaler Inhale 2 puffs into the lungs every 6 (six) hours as needed for wheezing. Patient not taking: Reported on 12/24/2018 09/12/18   Babs Sciara, MD  albuterol (PROVENTIL) (2.5 MG/3ML) 0.083%  nebulizer solution Take 3 mLs (2.5 mg total) by nebulization every 4 (four) hours as needed for wheezing. Patient not taking: Reported on 10/07/2018 09/16/18   Babs Sciara, MD  cyclobenzaprine (FLEXERIL) 10 MG tablet Take 1 tablet (10 mg total) by mouth 3 (three) times daily as needed. Patient not taking: Reported on 10/07/2018 09/06/18   Triplett, Tammy, PA-C  diazepam (VALIUM) 5 MG tablet Take 1 tablet (5 mg total) by mouth 2 (two) times daily. 01/25/19   Jacalyn Lefevre, MD  dicyclomine (BENTYL) 20 MG tablet Take 1 tablet (20 mg total) by mouth 2 (two) times daily. Patient not taking: Reported on 10/07/2018 06/05/18   Franky Macho, MD  etodolac (LODINE) 300 MG capsule Take 1 capsule (300 mg total) by mouth every 8 (eight) hours. Patient not taking: Reported on 12/24/2018 12/13/18   Linwood Dibbles, MD  HYDROcodone-acetaminophen (NORCO/VICODIN) 5-325 MG tablet Take 1 tablet by mouth every 4 (four) hours as needed. 01/25/19   Jacalyn Lefevre, MD  methocarbamol (ROBAXIN) 500 MG tablet Take 1 tablet (500 mg total) by mouth 2 (two) times daily. Patient not taking: Reported on 12/24/2018 12/13/18   Linwood Dibbles, MD  pantoprazole (PROTONIX) 40 MG tablet Take 1 tablet (40 mg total) by mouth daily. Patient not taking: Reported on 10/07/2018 06/24/18   Babs Sciara, MD  penicillin v potassium (  VEETID) 500 MG tablet Take 1 tablet (500 mg total) by mouth 4 (four) times daily. 12/30/18   Babs SciaraLuking, Scott A, MD  predniSONE (STERAPRED UNI-PAK 21 TAB) 10 MG (21) TBPK tablet Take 6 tabs for 2 days, then 5 for 2 days, then 4 for 2 days, then 3 for 2 days, 2 for 2 days, then 1 for 2 days 01/25/19   Jacalyn LefevreHaviland, Kong Packett, MD  promethazine-dextromethorphan (PROMETHAZINE-DM) 6.25-15 MG/5ML syrup Take 5 mLs by mouth 4 (four) times daily as needed for cough. Patient not taking: Reported on 10/07/2018 09/16/18   Babs SciaraLuking, Scott A, MD    Family History History reviewed. No pertinent family history.  Social History Social History   Tobacco  Use  . Smoking status: Current Every Day Smoker    Packs/day: 1.00    Types: Cigarettes  . Smokeless tobacco: Never Used  Substance Use Topics  . Alcohol use: No  . Drug use: No     Allergies   Toradol [ketorolac tromethamine]   Review of Systems Review of Systems  Musculoskeletal: Positive for back pain.  All other systems reviewed and are negative.    Physical Exam Updated Vital Signs BP 134/77   Pulse 90   Temp 98.2 F (36.8 C) (Oral)   Resp 18   Ht 6\' 2"  (1.88 m)   Wt 92 kg   SpO2 98%   BMI 26.04 kg/m   Physical Exam Vitals signs and nursing note reviewed.  Constitutional:      Appearance: Normal appearance.  HENT:     Head: Normocephalic and atraumatic.     Right Ear: External ear normal.     Left Ear: External ear normal.     Nose: Nose normal.     Mouth/Throat:     Mouth: Mucous membranes are moist.  Eyes:     Extraocular Movements: Extraocular movements intact.     Conjunctiva/sclera: Conjunctivae normal.     Pupils: Pupils are equal, round, and reactive to light.  Neck:     Musculoskeletal: Normal range of motion and neck supple.  Cardiovascular:     Rate and Rhythm: Normal rate and regular rhythm.     Pulses: Normal pulses.     Heart sounds: Normal heart sounds.  Pulmonary:     Effort: Pulmonary effort is normal.     Breath sounds: Normal breath sounds.  Abdominal:     General: Abdomen is flat. Bowel sounds are normal.     Palpations: Abdomen is soft.  Musculoskeletal:       Arms:  Skin:    General: Skin is warm.     Capillary Refill: Capillary refill takes less than 2 seconds.  Neurological:     General: No focal deficit present.     Mental Status: He is alert and oriented to person, place, and time.  Psychiatric:        Mood and Affect: Mood normal.        Behavior: Behavior normal.      ED Treatments / Results  Labs (all labs ordered are listed, but only abnormal results are displayed) Labs Reviewed - No data to display   EKG None  Radiology No results found.  Procedures Procedures (including critical care time)  Medications Ordered in ED Medications  dexamethasone (DECADRON) injection 10 mg (10 mg Intramuscular Given 01/25/19 2229)  diazepam (VALIUM) tablet 5 mg (5 mg Oral Given 01/25/19 2229)  HYDROcodone-acetaminophen (NORCO/VICODIN) 5-325 MG per tablet 1 tablet (1 tablet Oral Given 01/25/19 2229)  Initial Impression / Assessment and Plan / ED Course  I have reviewed the triage vital signs and the nursing notes.  Pertinent labs & imaging results that were available during my care of the patient were reviewed by me and considered in my medical decision making (see chart for details).       Pt encouraged to f/u with pcp to manage pain.  He is d/c home with back exercises.  Return if worse.    Final Clinical Impressions(s) / ED Diagnoses   Final diagnoses:  Chronic left-sided low back pain with left-sided sciatica  Chronic upper back pain    ED Discharge Orders         Ordered    diazepam (VALIUM) 5 MG tablet  2 times daily     01/25/19 2227    HYDROcodone-acetaminophen (NORCO/VICODIN) 5-325 MG tablet  Every 4 hours PRN     01/25/19 2227    predniSONE (STERAPRED UNI-PAK 21 TAB) 10 MG (21) TBPK tablet     01/25/19 2230           Jacalyn Lefevre, MD 01/25/19 2230    Jacalyn Lefevre, MD 01/25/19 2230

## 2019-01-25 NOTE — ED Notes (Signed)
ED Provider at bedside. 

## 2019-01-29 ENCOUNTER — Encounter (HOSPITAL_COMMUNITY): Payer: Self-pay | Admitting: *Deleted

## 2019-01-29 ENCOUNTER — Other Ambulatory Visit: Payer: Self-pay

## 2019-01-29 ENCOUNTER — Emergency Department (HOSPITAL_COMMUNITY)
Admission: EM | Admit: 2019-01-29 | Discharge: 2019-01-29 | Disposition: A | Discharge status: Home / Self Care | Payer: Medicare Other | Attending: Emergency Medicine | Admitting: Emergency Medicine

## 2019-01-29 DIAGNOSIS — M5432 Sciatica, left side: Secondary | ICD-10-CM

## 2019-01-29 DIAGNOSIS — F17210 Nicotine dependence, cigarettes, uncomplicated: Secondary | ICD-10-CM | POA: Diagnosis not present

## 2019-01-29 DIAGNOSIS — Z89422 Acquired absence of other left toe(s): Secondary | ICD-10-CM | POA: Diagnosis not present

## 2019-01-29 DIAGNOSIS — G8929 Other chronic pain: Secondary | ICD-10-CM

## 2019-01-29 DIAGNOSIS — M5430 Sciatica, unspecified side: Secondary | ICD-10-CM | POA: Insufficient documentation

## 2019-01-29 DIAGNOSIS — Z79899 Other long term (current) drug therapy: Secondary | ICD-10-CM | POA: Diagnosis not present

## 2019-01-29 DIAGNOSIS — M5442 Lumbago with sciatica, left side: Secondary | ICD-10-CM | POA: Diagnosis not present

## 2019-01-29 DIAGNOSIS — M549 Dorsalgia, unspecified: Secondary | ICD-10-CM | POA: Diagnosis present

## 2019-01-29 MED ORDER — ONDANSETRON HCL 4 MG PO TABS
4.0000 mg | ORAL_TABLET | Freq: Once | ORAL | Status: AC
  Administered 2019-01-29: 4 mg via ORAL
  Filled 2019-01-29: qty 1

## 2019-01-29 MED ORDER — CYCLOBENZAPRINE HCL 10 MG PO TABS: 10.0000 mg | ORAL_TABLET | Freq: Three times a day (TID) | ORAL | 0 refills | Status: DC

## 2019-01-29 MED ORDER — INDOMETHACIN 25 MG PO CAPS
25.0000 mg | ORAL_CAPSULE | Freq: Once | ORAL | Status: AC
  Administered 2019-01-29: 16:00:00 25 mg via ORAL
  Filled 2019-01-29: qty 1

## 2019-01-29 MED ORDER — CYCLOBENZAPRINE HCL 10 MG PO TABS
10.0000 mg | ORAL_TABLET | Freq: Once | ORAL | Status: AC
  Administered 2019-01-29: 10 mg via ORAL
  Filled 2019-01-29: qty 1

## 2019-01-29 MED ORDER — MELOXICAM 7.5 MG PO TABS: 7.5000 mg | ORAL_TABLET | Freq: Every day | ORAL | 0 refills | Status: DC

## 2019-01-29 MED ORDER — TRAMADOL HCL 50 MG PO TABS
100.0000 mg | ORAL_TABLET | Freq: Once | ORAL | Status: AC
  Administered 2019-01-29: 16:00:00 100 mg via ORAL
  Filled 2019-01-29: qty 2

## 2019-01-29 NOTE — ED Provider Notes (Signed)
[facility] EMERGENCY DEPARTMENT Provider Note   CSN: [id number] Arrival date & time: 01/29/19  1311    History   Chief Complaint Chief Complaint  Patient presents with  . Back Pain    HPI [name] is a 48 y.o. male.     Patient is a 48 year old male who presents to the emergency department with a complaint of back pain left buttocks pain extending into the left leg.  The patient states that 2 to 3 days ago he stumbled and had a near fall.  He reinjured his lower back.  The patient states that he has had degenerative disc disease in the past.  He has required spinal fusion back in 2006.  He now has a problem with chronic lower back pain.  The patient states that when this problem got worse 2 to 3 days ago he did not have any loss of bowel or bladder function.  He is not had any recurrent falls.  No unusual numbness in the saddle areas.  He is not had any other new.  Neurologic problem.  The patient states that the pain seems to be getting progressively worse in spite of him using heat and Biofreeze and other conservative measures.  He presents now for assistance with his pain and discomfort.  The history is provided by the patient.    Past Medical History:  Diagnosis Date  . Arthritis   . Chronic back pain   . History of degenerative disc disease   . History of spinal fusion     Patient Active Problem List   Diagnosis Date Noted  . Calculus of gallbladder without cholecystitis without obstruction   . Closed displaced fracture of fourth metacarpal bone of right hand, initial encounter  07/29/17 08/29/2017  . Radicular pain in right arm 04/22/2016  . Hyperlipidemia 10/16/2013  . Allergic rhinitis 01/09/2013  . Chronic low back pain 12/17/2012    Past Surgical History:  Procedure Laterality Date  . 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  CHOLECYSTECTOMY N/A 06/13/2018   Procedure: LAPAROSCOPIC CHOLECYSTECTOMY;  Surgeon: Franky Macho, MD;  Location: [facility];  Service: General;  Laterality: N/A;  . SPINAL FUSION          Home Medications    Prior to Admission medications   Medication Sig Start Date End Date Taking? Authorizing Provider  albuterol (PROVENTIL HFA;VENTOLIN HFA) 108 (90 Base) MCG/ACT inhaler Inhale 2 puffs into the lungs every 6 (six) hours as needed for wheezing. Patient not taking: Reported on 12/24/2018 09/12/18   Babs Sciara, MD  albuterol (PROVENTIL) (2.5 MG/3ML) 0.083% nebulizer solution Take 3 mLs (2.5 mg total) by nebulization every 4 (four) hours as needed for wheezing. Patient not taking: Reported on 10/07/2018 09/16/18   Babs Sciara, MD  cyclobenzaprine (FLEXERIL) 10 MG tablet Take 1 tablet (10 mg total) by mouth 3 (three) times daily as needed. Patient not taking: Reported on 10/07/2018 09/06/18   Triplett, Tammy, PA-C  diazepam (VALIUM) 5 MG tablet Take 1 tablet (5 mg total) by mouth 2 (two) times daily. 01/25/19   Jacalyn Lefevre, MD  dicyclomine (BENTYL) 20 MG tablet Take 1 tablet (20 mg total) by mouth 2 (two) times daily. Patient not taking: Reported on 10/07/2018 06/05/18   Franky Macho, MD  etodolac (LODINE) 300 MG capsule Take 1 capsule (300 mg total) by mouth every  8 (eight) hours. Patient not taking: Reported on 12/24/2018 12/13/18   Linwood Dibbles, MD  HYDROcodone-acetaminophen (NORCO/VICODIN) 5-325 MG tablet Take 1 tablet by mouth every 4 (four) hours as needed. 01/25/19   Jacalyn Lefevre, MD  methocarbamol (ROBAXIN) 500 MG tablet Take 1 tablet (500 mg total) by mouth 2 (two) times daily. Patient not taking: Reported on 12/24/2018 12/13/18   Linwood Dibbles, MD  pantoprazole (PROTONIX) 40 MG tablet Take 1 tablet (40 mg total) by mouth daily. Patient not taking: Reported on 10/07/2018 06/24/18   Babs Sciara, MD  penicillin v potassium (VEETID) 500 MG tablet Take 1 tablet (500 mg total) by mouth 4  (four) times daily. 12/30/18   Babs Sciara, MD  predniSONE (STERAPRED UNI-PAK 21 TAB) 10 MG (21) TBPK tablet Take 6 tabs for 2 days, then 5 for 2 days, then 4 for 2 days, then 3 for 2 days, 2 for 2 days, then 1 for 2 days 01/25/19   Jacalyn Lefevre, MD  promethazine-dextromethorphan (PROMETHAZINE-DM) 6.25-15 MG/5ML syrup Take 5 mLs by mouth 4 (four) times daily as needed for cough. Patient not taking: Reported on 10/07/2018 09/16/18   Babs Sciara, MD    Family History No family history on file.  Social History Social History   Tobacco Use  . Smoking status: Current Every Day Smoker    Packs/day: 1.00    Types: Cigarettes  . Smokeless tobacco: Never Used  Substance Use Topics  . Alcohol use: No  . Drug use: No     Allergies   Toradol [ketorolac tromethamine]   Review of Systems Review of Systems  Constitutional: Negative for activity change.       All ROS Neg except as noted in HPI  HENT: Negative for nosebleeds.   Eyes: Negative for photophobia and discharge.  Respiratory: Negative for cough, shortness of breath and wheezing.   Cardiovascular: Negative for chest pain and palpitations.  Gastrointestinal: Negative for abdominal pain and blood in stool.  Genitourinary: Negative for dysuria, frequency and hematuria.  Musculoskeletal: Positive for back pain. Negative for arthralgias and neck pain.  Skin: Negative.   Neurological: Negative for dizziness, seizures and speech difficulty.  Psychiatric/Behavioral: Negative for confusion and hallucinations.     Physical Exam Updated Vital Signs BP 117/87   Pulse 94   Temp 98 F (36.7 C)   Resp 20   Ht  (1.854 m)   Wt 93 kg   SpO2 95%   BMI 27.05 kg/m   Physical Exam Vitals signs and nursing note reviewed.  Constitutional:      Appearance: He is well-developed. He is not toxic-appearing.  HENT:     Head: Normocephalic.     Right Ear: Tympanic membrane and external ear normal.     Left Ear: Tympanic  membrane and external ear normal.  Eyes:     General: Lids are normal.     Pupils: Pupils are equal, round, and reactive to light.  Neck:     Musculoskeletal: Normal range of motion and neck supple.     Vascular: No carotid bruit.  Cardiovascular:     Rate and Rhythm: Normal rate and regular rhythm.     Pulses: Normal pulses.     Heart sounds: Normal heart sounds.  Pulmonary:     Effort: No respiratory distress.     Breath sounds: Normal breath sounds.  Abdominal:     General: Bowel sounds are normal.     Palpations: Abdomen is soft.  Tenderness: There is no abdominal tenderness. There is no guarding.  Musculoskeletal:     Lumbar back: He exhibits decreased range of motion, pain and spasm.       Back:  Lymphadenopathy:     Head:     Right side of head: No submandibular adenopathy.     Left side of head: No submandibular adenopathy.     Cervical: No cervical adenopathy.  Skin:    General: Skin is warm and dry.  Neurological:     Mental Status: He is alert and oriented to person, place, and time.     Cranial Nerves: No cranial nerve deficit.     Sensory: No sensory deficit.     Comments: No motor or sensory deficits of the lower extremity.  No sensory deficits in the saddle area.  No foot drop appreciated.  No difficulty with balance when standing and walking.  Psychiatric:        Speech: Speech normal.      ED Treatments / Results  Labs (all labs ordered are listed, but only abnormal results are displayed) Labs Reviewed - No data to display  EKG None  Radiology No results found.  Procedures Procedures (including critical care time)  Medications Ordered in ED Medications - No data to display   Initial Impression / Assessment and Plan / ED Course  I have reviewed the triage vital signs and the nursing notes.  Pertinent labs & imaging results that were available during my care of the patient were reviewed by me and considered in my medical decision making  (see chart for details).          Final Clinical Impressions(s) / ED Diagnoses MDM  Vital signs within normal limits.  Pulse oximetry is 95% on room air.  Within normal limits by my interpretation.  There is no acute neurologic deficit appreciated.  No evidence for cauda equina or other emergent neurologic issues concerning the back.  No recent operations or procedures involving the back. Pt was evaluated on 5/10 in ED and diagnosed with chronic left low back pain.  I have asked the patient to continue to use the heat.  To rest the back is much as possible. He states he still has a portion of the steroid dose pack from 5/10.   We will add Flexeril and Mobic to patient's current treatment plan.  Patient strongly encouraged to see orthopedics concerning his recurrent back issues.  At discharge, the patient requested that he receive Valium and hydrocodone.  The patient received a prescription for Valium and hydrocodone approximately 4 days ago.  Prescription for Flexeril and Mobic provided for the patient at this time.  I have asked the patient to see his primary physician or to follow-up with orthopedics for additional evaluation and management of his ongoing pain and discomfort.   Final diagnoses:  Left sided sciatica  Chronic left-sided low back pain with left-sided sciatica    ED Discharge Orders         Ordered    cyclobenzaprine (FLEXERIL) 10 MG tablet  3 times daily     01/29/19 1558    meloxicam (MOBIC) 7.5 MG tablet  Daily     01/29/19 1558           Ivery QualeBryant, Sherleen Pangborn, PA-C 01/29/19 1624    Samuel JesterMcManus, Kathleen, DO 02/02/19 1023

## 2019-01-29 NOTE — ED Triage Notes (Signed)
Pain in left shoulder, left buttock and left leg, seen for same several days ago

## 2019-01-29 NOTE — Discharge Instructions (Addendum)
Your vital signs have been reviewed.  Fortunately no acute neurologic deficit appreciated on examination at this time.  The examination favors exacerbation of your chronic sciatica, and muscle strain.  Please continue your steroid Dosepak as prescribed on May 10.  Please add Flexeril 3 times daily and Mobic 7.5 mg daily with food.  Continue to use heat, and rest her back is much as possible.  Please see Dr. Romeo Apple, or the orthopedic specialist of your choice for follow-up of your increasing back problem.

## 2019-01-30 ENCOUNTER — Telehealth: Payer: Self-pay | Admitting: Family Medicine

## 2019-01-30 ENCOUNTER — Other Ambulatory Visit: Payer: Self-pay | Admitting: Family Medicine

## 2019-01-30 MED ORDER — PREDNISONE 20 MG PO TABS: ORAL_TABLET | ORAL | 0 refills | Status: DC

## 2019-01-30 NOTE — Telephone Encounter (Signed)
Patient was referred to pain management and released from our management of pain medication in 2016 per Dr Lorin Picket

## 2019-01-30 NOTE — Telephone Encounter (Signed)
Call in adult pred taper, f u dr Lorin Picket next week, I will not do pain meds over phone for this scenario,

## 2019-01-30 NOTE — Telephone Encounter (Signed)
Pt calling in to see if Dr. Brett Canales could help him with his sciatic pain today. Pt has scheduled an ER follow up for the sciatic pain on Tuesday May 19th with Dr. Lorin Picket. Pt states he has been to the ER twice this past week for the pain and they suggest he follow up with his PCP. Pt states he is unable to sleep or get comfortable.   [facility] - [place], [place] - [address]

## 2019-01-30 NOTE — Telephone Encounter (Signed)
Prescription was sent electronically to pharmacy and patient was notified.

## 2019-02-03 ENCOUNTER — Encounter: Payer: Self-pay | Admitting: Family Medicine

## 2019-02-03 ENCOUNTER — Ambulatory Visit (INDEPENDENT_AMBULATORY_CARE_PROVIDER_SITE_OTHER): Payer: Medicare Other | Admitting: Family Medicine

## 2019-02-03 ENCOUNTER — Ambulatory Visit (HOSPITAL_COMMUNITY)
Admission: RE | Admit: 2019-02-03 | Discharge: 2019-02-03 | Disposition: A | Discharge status: Home / Self Care | Payer: Medicare Other | Source: Ambulatory Visit | Attending: Family Medicine | Admitting: Family Medicine

## 2019-02-03 ENCOUNTER — Other Ambulatory Visit: Payer: Self-pay

## 2019-02-03 DIAGNOSIS — M545 Low back pain: Secondary | ICD-10-CM | POA: Diagnosis not present

## 2019-02-03 DIAGNOSIS — M5432 Sciatica, left side: Secondary | ICD-10-CM | POA: Diagnosis not present

## 2019-02-03 DIAGNOSIS — M4807 Spinal stenosis, lumbosacral region: Secondary | ICD-10-CM

## 2019-02-03 MED ORDER — HYDROCODONE-ACETAMINOPHEN 10-325 MG PO TABS: 1.0000 | ORAL_TABLET | ORAL | 0 refills | Status: DC | PRN

## 2019-02-03 NOTE — Progress Notes (Signed)
   Subjective:    Patient ID: [name], male    DOB: [birth year], 48 y.o.   MRN: [id number]  HPI Pt here today due to sciatic pain. Pt states this has been going on a while. Pain does radiate to buttock area and down legs. Mostly left leg but sometimes both. Has tried heat, tylenol, advil and muscle relaxer. Pt wondering if he should get a MRI of lower back.   Patient has had 8 weeks of lower back pain radiating down the left leg severe pain very difficult for him to be able to tolerate it.  Causing significant disruption in day-to-day activities has a hard time moving about getting on and off his bed moving around the house he does minimal work  Review of Systems  Constitutional: Negative for activity change.  HENT: Negative for congestion and rhinorrhea.   Respiratory: Negative for cough and shortness of breath.   Cardiovascular: Negative for chest pain.  Gastrointestinal: Negative for abdominal pain, diarrhea, nausea and vomiting.  Genitourinary: Negative for dysuria and hematuria.  Musculoskeletal: Positive for back pain. Negative for joint swelling.  Neurological: Negative for weakness and headaches.  Psychiatric/Behavioral: Negative for behavioral problems and confusion.   Has positive sciatica down the left leg    Objective:   Physical Exam Vitals signs reviewed.  Cardiovascular:     Rate and Rhythm: Normal rate and regular rhythm.     Heart sounds: Normal heart sounds. No murmur.  Pulmonary:     Effort: Pulmonary effort is normal.     Breath sounds: Normal breath sounds.  Lymphadenopathy:     Cervical: No cervical adenopathy.  Neurological:     Mental Status: He is alert.  Psychiatric:        Behavior: Behavior normal.   Positive straight leg raise on the left decreased range of motion has good strength in his legs        Assessment & Plan:  Has a history of spinal fusion of the lumbar spine Recommend lumbar sacral x-rays Pain medication limited number given  If the patient needs ongoing chronic pain management he will need to be seen via pain management we will not be doing his ongoing pain management I do believe the patient would benefit from a MRI but we will first do the lumbar sacral x-rays Gentle range of motion exercises recommended It should be noted that this patient has had several ER visits in the past few weeks because of this issue

## 2019-02-06 ENCOUNTER — Telehealth: Payer: Self-pay | Admitting: Family Medicine

## 2019-02-06 NOTE — Telephone Encounter (Signed)
Pt was informed of xray results. Please advise. Thank you

## 2019-02-06 NOTE — Addendum Note (Signed)
Addended by: Marlowe Shores on: 02/06/2019 10:48 AM   Modules accepted: Orders

## 2019-02-06 NOTE — Telephone Encounter (Signed)
Patient is requesting result of x-ray and also would like refill on hydrocodone 10/325 and a muscle relaxer due to back pain. [employer]

## 2019-02-10 ENCOUNTER — Telehealth: Payer: Self-pay | Admitting: Family Medicine

## 2019-02-10 ENCOUNTER — Other Ambulatory Visit: Payer: Self-pay | Admitting: Family Medicine

## 2019-02-10 MED ORDER — HYDROCODONE-ACETAMINOPHEN 10-325 MG PO TABS: 1.0000 | ORAL_TABLET | ORAL | 0 refills | Status: DC | PRN

## 2019-02-10 NOTE — Telephone Encounter (Signed)
See other phone message  

## 2019-02-10 NOTE — Telephone Encounter (Signed)
Patient notified and verbalized understanding. MRIs have been ordered in [facility] and are awaiting prior approval and scheduling.

## 2019-02-10 NOTE — Telephone Encounter (Signed)
Pt called to check on previous message, requested hydrocodone & a muscle relaxer & has not heard, please advise & call pt   [employer]

## 2019-02-10 NOTE — Telephone Encounter (Signed)
Patient received Hydrocodone 10/325 #20 one q4-6 hrs PRN for 5 days on 02/03/2019  Patient states he sent in refill request Friday 02/06/19 for a refill of his hydrocodone and muscle relaxer and has not heard from it and needs both refilled  Please advise (note states will need pain management for ongoing refills of pain med)

## 2019-02-10 NOTE — Telephone Encounter (Signed)
I did send in a prescription for the hydrocodone to use sparingly we will not be able to provide long-term. Also muscle relaxers are not recommended for this type of situation and the medical board does not want [place] to prescribe both pain medicine and muscle relaxers. Hopefully his MRIs are being scheduled?

## 2019-02-20 ENCOUNTER — Other Ambulatory Visit: Payer: Self-pay | Admitting: Family Medicine

## 2019-02-20 DIAGNOSIS — M4808 Spinal stenosis, sacral and sacrococcygeal region: Secondary | ICD-10-CM

## 2019-02-24 ENCOUNTER — Emergency Department (HOSPITAL_COMMUNITY)
Admission: EM | Admit: 2019-02-24 | Discharge: 2019-02-24 | Disposition: A | Discharge status: Home / Self Care | Payer: Medicare Other | Attending: Emergency Medicine | Admitting: Emergency Medicine

## 2019-02-24 ENCOUNTER — Encounter (HOSPITAL_COMMUNITY): Payer: Self-pay

## 2019-02-24 ENCOUNTER — Other Ambulatory Visit: Payer: Self-pay

## 2019-02-24 DIAGNOSIS — Z5321 Procedure and treatment not carried out due to patient leaving prior to being seen by health care provider: Secondary | ICD-10-CM | POA: Insufficient documentation

## 2019-02-24 DIAGNOSIS — M545 Low back pain: Secondary | ICD-10-CM | POA: Diagnosis not present

## 2019-02-24 NOTE — ED Triage Notes (Signed)
Pt presents to ED with complaints of lower back pain x couple days following moving furniture.

## 2019-02-25 ENCOUNTER — Encounter (HOSPITAL_COMMUNITY): Payer: Self-pay | Admitting: Emergency Medicine

## 2019-02-25 ENCOUNTER — Emergency Department (HOSPITAL_COMMUNITY)
Admission: EM | Admit: 2019-02-25 | Discharge: 2019-02-25 | Disposition: A | Discharge status: Home / Self Care | Payer: Medicare Other | Attending: Emergency Medicine | Admitting: Emergency Medicine

## 2019-02-25 ENCOUNTER — Other Ambulatory Visit: Payer: Self-pay

## 2019-02-25 DIAGNOSIS — G8929 Other chronic pain: Secondary | ICD-10-CM | POA: Diagnosis not present

## 2019-02-25 DIAGNOSIS — F17210 Nicotine dependence, cigarettes, uncomplicated: Secondary | ICD-10-CM | POA: Insufficient documentation

## 2019-02-25 DIAGNOSIS — Z79899 Other long term (current) drug therapy: Secondary | ICD-10-CM | POA: Insufficient documentation

## 2019-02-25 DIAGNOSIS — M545 Low back pain: Secondary | ICD-10-CM | POA: Diagnosis present

## 2019-02-25 DIAGNOSIS — M5441 Lumbago with sciatica, right side: Secondary | ICD-10-CM | POA: Diagnosis not present

## 2019-02-25 MED ORDER — DEXAMETHASONE SODIUM PHOSPHATE 4 MG/ML IJ SOLN
10.0000 mg | Freq: Once | INTRAMUSCULAR | Status: AC
  Administered 2019-02-25: 10 mg via INTRAMUSCULAR
  Filled 2019-02-25: qty 3

## 2019-02-25 MED ORDER — METHOCARBAMOL 500 MG PO TABS: 500.0000 mg | ORAL_TABLET | Freq: Every evening | ORAL | 0 refills | Status: DC | PRN

## 2019-02-25 NOTE — ED Provider Notes (Signed)
[facility] EMERGENCY DEPARTMENT Provider Note   CSN: [id number] Arrival date & time: 02/25/19  1209    History   Chief Complaint Chief Complaint  Patient presents with  . Back Pain    HPI [name] is a 48 y.o. male presenting for evaluation of back pain.  Patient states over the past 3 to 4 days, he has had gradually worsening back pain.  This began when he woke up one morning.  He denies precipitating event such as fall, trauma, or injury.  He has a history of chronic back pain, states it has been worse recently.  He has been taking Tylenol, Advil, using Biofreeze, Voltaren gel, and heating pads without improvement of his symptoms.  He has not seen or talked to his primary care doctor about this.  Previous constant, worse with movement.  Nothing makes it better.  Pain radiates down his right leg intermittently.  He has a history of sciatica, states this feels similar but is last longer than normal.  He denies fevers, chills, cough, abdominal pain, urinary symptoms, loss of bowel bladder control, numbness, history of cancer, history of IVDU.  Additional history obtained per chart review.  Patient has been seen in the ED and by his PCP multiple times for back pain.  Last pcp note states pt will need to f/u with a pain clinic if he has continued need for narcotics. MRI scheduled with PCP.      HPI  Past Medical History:  Diagnosis Date  . Arthritis   . Chronic back pain   . History of degenerative disc disease   . History of spinal fusion     Patient Active Problem List   Diagnosis Date Noted  . Calculus of gallbladder without cholecystitis without obstruction   . Closed displaced fracture of fourth metacarpal bone of right hand, initial encounter  07/29/17 08/29/2017  . Radicular pain in right arm 04/22/2016  . Hyperlipidemia 10/16/2013  . Allergic rhinitis 01/09/2013  . Chronic low back pain 12/17/2012    Past Surgical History:  Procedure Laterality Date  .  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 CHOLECYSTECTOMY N/A 06/13/2018   Procedure: LAPAROSCOPIC CHOLECYSTECTOMY;  Surgeon: Franky MachoJenkins, Mark, MD;  Location: [facility];  Service: General;  Laterality: N/A;  . SPINAL FUSION          Home Medications    Prior to Admission medications   Medication Sig Start Date End Date Taking? Authorizing Provider  albuterol (PROVENTIL HFA;VENTOLIN HFA) 108 (90 Base) MCG/ACT inhaler Inhale 2 puffs into the lungs every 6 (six) hours as needed for wheezing. 09/12/18   Babs SciaraLuking, Scott A, MD  albuterol (PROVENTIL) (2.5 MG/3ML) 0.083% nebulizer solution Take 3 mLs (2.5 mg total) by nebulization every 4 (four) hours as needed for wheezing. 09/16/18   Babs SciaraLuking, Scott A, MD  dicyclomine (BENTYL) 20 MG tablet Take 1 tablet (20 mg total) by mouth 2 (two) times daily. Patient not taking: Reported on 10/07/2018 06/05/18   Franky MachoJenkins, Mark, MD  methocarbamol (ROBAXIN) 500 MG tablet Take 1 tablet (500 mg total) by mouth at bedtime as needed for up to 5 doses for muscle spasms. 02/25/19   Lynde Ludwig, PA-C  pantoprazole (PROTONIX) 40 MG tablet Take 1 tablet (40 mg total) by mouth daily. 06/24/18   Babs SciaraLuking, Scott A, MD    Family History No family history on file.  Social History Social History   Tobacco Use  . Smoking status: Current Every Day Smoker    Packs/day: 1.00    Types: Cigarettes  . Smokeless tobacco: Never Used  Substance Use Topics  . Alcohol use: No  . Drug use: No     Allergies   Toradol [ketorolac tromethamine]   Review of Systems Review of Systems  Constitutional: Negative for fever.  Musculoskeletal: Positive for back pain.     Physical Exam Updated Vital Signs BP (!) 141/87 (BP Location: Right Arm)   Pulse 74   Temp 98.2 F (36.8 C) (Oral)   Resp 16   Ht 6\' 1"  (1.854 m)   Wt 95.2 kg   SpO2 95%   BMI 27.70  kg/m   Physical Exam Vitals signs and nursing note reviewed.  Constitutional:      General: He is not in acute distress.    Appearance: He is well-developed.     Comments: Appears nontoxic  HENT:     Head: Normocephalic and atraumatic.  Neck:     Musculoskeletal: Normal range of motion.     Comments: No neck tenderness Cardiovascular:     Rate and Rhythm: Normal rate and regular rhythm.     Pulses: Normal pulses.  Pulmonary:     Effort: Pulmonary effort is normal.     Breath sounds: Normal breath sounds.  Abdominal:     General: There is no distension.     Palpations: There is no mass.     Tenderness: There is no abdominal tenderness. There is no guarding or rebound.  Musculoskeletal: Normal range of motion.     Comments: ttp of bilateral back musculature. No ttp of midline spine. +SLR on R. Strength and sensation intact bilaterally. No saddle paresthesias.   Skin:    General: Skin is warm.     Capillary Refill: Capillary refill takes less than 2 seconds.     Findings: No rash.  Neurological:     Mental Status: He is alert and oriented to person, place, and time.      ED Treatments / Results  Labs (all labs ordered are listed, but only abnormal results are displayed) Labs Reviewed - No data to display  EKG None  Radiology No results found.  Procedures Procedures (including critical care time)  Medications Ordered in ED Medications  dexamethasone (DECADRON) injection 10 mg (10 mg Intramuscular Given 02/25/19 1317)     Initial Impression / Assessment and Plan / ED Course  I have reviewed the triage vital signs and the nursing notes.  Pertinent labs & imaging results that were available during my care of the patient were reviewed by me and considered in my medical decision making (see chart for details).        Patient presenting for evaluation of low back pain.  Physical exam reassuring, neurovascularly intact.  No red flags for back pain.  Pain is  reproducible with palpation of the musculature.  Likely sciatica, as he has +SLR.  Doubt fracture, I do not believe x-rays will be beneficial.  Doubt vertebral injury, infection, spinal cord compression, myelopathy, or cauda equina syndrome.  Will treat symptomatically with prednisone, muscle relaxers, muscle creams.  Patient encouraged to f/u with pcp.  At this time, patient appears safe for discharge.  Return precautions given.  Patient states he understands agrees plan.  Final Clinical Impressions(s) / ED Diagnoses   Final diagnoses:  Chronic bilateral low back pain with right-sided sciatica    ED Discharge  Orders         Ordered    methocarbamol (ROBAXIN) 500 MG tablet  At bedtime PRN     02/25/19 1315           Franchot Heidelberg, PA-C 02/25/19 1356    Maudie Flakes, MD 02/26/19 [phone]

## 2019-02-25 NOTE — Discharge Instructions (Signed)
Take prednisone as prescribed.  Take Tylenol for pain control. Use Robaxin as needed for muscle stiffness or soreness. Have caution, as this may make you tired or groggy. Do not drive or operate heavy machinery while taking this medication.  Continue using voltaren for pain control. Follow up with your primary doctor for further evaluation of your  Return to the ER if you develop high fevers, numbness, loss of bowel or bladder control, or any new or concerning symptoms.

## 2019-02-25 NOTE — ED Triage Notes (Signed)
Patient states lower back pain x 3 days. Came for same yesterday but had to leave due to time.

## 2019-03-06 ENCOUNTER — Ambulatory Visit (HOSPITAL_COMMUNITY)
Admission: RE | Admit: 2019-03-06 | Discharge: 2019-03-06 | Disposition: A | Discharge status: Home / Self Care | Payer: Medicare Other | Source: Ambulatory Visit | Attending: Family Medicine | Admitting: Family Medicine

## 2019-03-06 ENCOUNTER — Other Ambulatory Visit: Payer: Self-pay

## 2019-03-06 DIAGNOSIS — M47816 Spondylosis without myelopathy or radiculopathy, lumbar region: Secondary | ICD-10-CM | POA: Diagnosis not present

## 2019-03-06 DIAGNOSIS — I714 Abdominal aortic aneurysm, without rupture: Secondary | ICD-10-CM | POA: Diagnosis not present

## 2019-03-06 DIAGNOSIS — M4808 Spinal stenosis, sacral and sacrococcygeal region: Secondary | ICD-10-CM | POA: Diagnosis not present

## 2019-03-06 DIAGNOSIS — Z981 Arthrodesis status: Secondary | ICD-10-CM | POA: Diagnosis not present

## 2019-03-06 MED ORDER — GADOBUTROL 1 MMOL/ML IV SOLN
9.0000 mL | Freq: Once | INTRAVENOUS | Status: AC | PRN
  Administered 2019-03-06: 9 mL via INTRAVENOUS

## 2019-03-11 ENCOUNTER — Ambulatory Visit (INDEPENDENT_AMBULATORY_CARE_PROVIDER_SITE_OTHER): Payer: Medicare Other | Admitting: Family Medicine

## 2019-03-11 ENCOUNTER — Other Ambulatory Visit: Payer: Self-pay

## 2019-03-11 ENCOUNTER — Encounter: Payer: Self-pay | Admitting: Family Medicine

## 2019-03-11 VITALS — BP 128/84 | Temp 97.4°F | Ht 71.0 in | Wt 216.0 lb

## 2019-03-11 DIAGNOSIS — J301 Allergic rhinitis due to pollen: Secondary | ICD-10-CM | POA: Diagnosis not present

## 2019-03-11 DIAGNOSIS — M5441 Lumbago with sciatica, right side: Secondary | ICD-10-CM | POA: Diagnosis not present

## 2019-03-11 DIAGNOSIS — I714 Abdominal aortic aneurysm, without rupture, unspecified: Secondary | ICD-10-CM | POA: Insufficient documentation

## 2019-03-11 DIAGNOSIS — G8929 Other chronic pain: Secondary | ICD-10-CM

## 2019-03-11 DIAGNOSIS — E785 Hyperlipidemia, unspecified: Secondary | ICD-10-CM | POA: Diagnosis not present

## 2019-03-11 DIAGNOSIS — Z1211 Encounter for screening for malignant neoplasm of colon: Secondary | ICD-10-CM | POA: Diagnosis not present

## 2019-03-11 DIAGNOSIS — Z0000 Encounter for general adult medical examination without abnormal findings: Secondary | ICD-10-CM | POA: Diagnosis not present

## 2019-03-11 DIAGNOSIS — Z114 Encounter for screening for human immunodeficiency virus [HIV]: Secondary | ICD-10-CM | POA: Diagnosis not present

## 2019-03-11 DIAGNOSIS — Z125 Encounter for screening for malignant neoplasm of prostate: Secondary | ICD-10-CM

## 2019-03-11 MED ORDER — HYDROCODONE-ACETAMINOPHEN 10-325 MG PO TABS: 1.0000 | ORAL_TABLET | ORAL | 0 refills | Status: AC | PRN

## 2019-03-11 MED ORDER — PREDNISONE 20 MG PO TABS: ORAL_TABLET | ORAL | 0 refills | Status: DC

## 2019-03-11 NOTE — Patient Instructions (Signed)
Patient was counseled we will be referring him for colonoscopy Up-to-date on immunizations Counseled to quit smoking Will be referring patient for back injections

## 2019-03-11 NOTE — Progress Notes (Signed)
Subjective:    Patient ID: [name], male    DOB: 06/13/1971, 48 y.o.   MRN: [id number]  HPI The patient comes in today for a wellness visit.  Wellness exam Patient trying to do good job taking care of self He does smoke He knows he needs to quit  He also has allergies constantly uses Benadryl for this occasionally over-the-counter allergy medicine it does help  He denies any wheezing difficulty breathing or shortness of breath  Denies abdominal pain  Does relate a lot of low back pain and sciatica.  Had recent MRI which showed degenerative changes.  A review of their health history was completed.  A review of medications was also completed.  Any needed refills; none  Eating habits: health conscious  Falls/  MVA accidents in past few months: none  Regular exercise: not able to  Specialist pt sees on regular basis: none  Preventative health issues were discussed.   Additional concerns: pain in back and down leg. Would like some pain meds and prednisone.   Cough and congestion. Taking benadryl for allergies.   Review of Systems  Constitutional: Negative for activity change, appetite change and fever.  HENT: Negative for congestion and rhinorrhea.   Eyes: Negative for discharge.  Respiratory: Negative for cough and wheezing.   Cardiovascular: Negative for chest pain.  Gastrointestinal: Negative for abdominal pain, blood in stool and vomiting.  Genitourinary: Negative for difficulty urinating and frequency.  Musculoskeletal: Positive for back pain. Negative for arthralgias and neck pain.  Skin: Negative for rash.  Allergic/Immunologic: Negative for environmental allergies and food allergies.  Neurological: Negative for weakness and headaches.  Psychiatric/Behavioral: Negative for agitation.       Objective:   Physical Exam Constitutional:      Appearance: He is well-developed.  HENT:     Head: Normocephalic and atraumatic.     Right Ear: External ear  normal.     Left Ear: External ear normal.     Nose: Nose normal.  Eyes:     Pupils: Pupils are equal, round, and reactive to light.  Neck:     Musculoskeletal: Normal range of motion and neck supple.     Thyroid: No thyromegaly.  Cardiovascular:     Rate and Rhythm: Normal rate and regular rhythm.     Heart sounds: Normal heart sounds. No murmur.  Pulmonary:     Effort: Pulmonary effort is normal. No respiratory distress.     Breath sounds: Normal breath sounds. No wheezing.  Abdominal:     General: Bowel sounds are normal. There is no distension.     Palpations: Abdomen is soft. There is no mass.     Tenderness: There is no abdominal tenderness.  Genitourinary:    Penis: Normal.      Prostate: Normal.  Musculoskeletal: Normal range of motion.  Lymphadenopathy:     Cervical: No cervical adenopathy.  Skin:    General: Skin is warm and dry.     Findings: No erythema.  Neurological:     Mental Status: He is alert.     Motor: No abnormal muscle tone.  Psychiatric:        Behavior: Behavior normal.        Judgment: Judgment normal.           Assessment & Plan:  Adult wellness-complete.wellness physical was conducted today. Importance of diet and exercise were discussed in detail.  In addition to this a discussion regarding safety was also covered. We  also reviewed over immunizations and gave recommendations regarding current immunization needed for age.  In addition to this additional areas were also touched on including: Preventative health exams needed:  Colonoscopy go ahead with referral  Patient was advised yearly wellness exam  Low back pain and sciatica pain medication given short male.  Patient was encouraged that if he needs pain medicine long-term he will need to go to pain management  Referral for tach injections to orthopedics.  I do not feel the patient will need surgery  Aortic aneurysm Long discussion held regarding this we will do a follow-up in 3  years of ultrasound patient strongly encouraged to quit smoking  Lab work ordered await results

## 2019-03-17 ENCOUNTER — Encounter: Payer: Self-pay | Admitting: Family Medicine

## 2019-03-18 ENCOUNTER — Other Ambulatory Visit: Payer: Self-pay | Admitting: Family Medicine

## 2019-03-18 DIAGNOSIS — E785 Hyperlipidemia, unspecified: Secondary | ICD-10-CM | POA: Diagnosis not present

## 2019-03-18 DIAGNOSIS — Z114 Encounter for screening for human immunodeficiency virus [HIV]: Secondary | ICD-10-CM | POA: Diagnosis not present

## 2019-03-18 DIAGNOSIS — Z0000 Encounter for general adult medical examination without abnormal findings: Secondary | ICD-10-CM | POA: Diagnosis not present

## 2019-03-18 DIAGNOSIS — Z125 Encounter for screening for malignant neoplasm of prostate: Secondary | ICD-10-CM | POA: Diagnosis not present

## 2019-03-19 LAB — LIPID PANEL
Cholesterol: 181 mg/dL (ref <200)
HDL: 44 mg/dL (ref >=40)
LDL Cholesterol (Calc): 106 mg/dL (calc) — ABNORMAL HIGH
Non-HDL Cholesterol (Calc): 137 mg/dL (calc) — ABNORMAL HIGH (ref <130)
Total CHOL/HDL Ratio: 4.1 (calc) (ref <5.0)
Triglycerides: 192 mg/dL — ABNORMAL HIGH (ref <150)

## 2019-03-19 LAB — BASIC METABOLIC PANEL WITH GFR
BUN: 18 mg/dL (ref 7–25)
CO2: 26 mmol/L (ref 20–32)
Calcium: 9 mg/dL (ref 8.6–10.3)
Chloride: 110 mmol/L (ref 98–110)
Creat: 1.04 mg/dL (ref 0.70–1.33)
GFR, Est African American: 97 mL/min/{1.73_m2} (ref >=60)
GFR, Est Non African American: 83 mL/min/{1.73_m2} (ref >=60)
Glucose, Bld: 88 mg/dL (ref 65–99)
Potassium: 4.4 mmol/L (ref 3.5–5.3)
Sodium: 142 mmol/L (ref 135–146)

## 2019-03-19 LAB — HIV ANTIBODY (ROUTINE TESTING W REFLEX): HIV 1&2 Ab, 4th Generation: NONREACTIVE

## 2019-03-19 LAB — PSA: PSA: 0.5 ng/mL (ref <=4.0)

## 2019-03-22 ENCOUNTER — Encounter: Payer: Self-pay | Admitting: Family Medicine

## 2019-03-23 ENCOUNTER — Encounter: Payer: Self-pay | Admitting: Internal Medicine

## 2019-03-25 ENCOUNTER — Other Ambulatory Visit: Payer: Self-pay

## 2019-03-25 ENCOUNTER — Ambulatory Visit (INDEPENDENT_AMBULATORY_CARE_PROVIDER_SITE_OTHER): Payer: Medicare Other | Admitting: Family Medicine

## 2019-03-25 DIAGNOSIS — M5441 Lumbago with sciatica, right side: Secondary | ICD-10-CM | POA: Diagnosis not present

## 2019-03-25 DIAGNOSIS — G8929 Other chronic pain: Secondary | ICD-10-CM | POA: Diagnosis not present

## 2019-03-25 DIAGNOSIS — I714 Abdominal aortic aneurysm, without rupture, unspecified: Secondary | ICD-10-CM

## 2019-03-25 DIAGNOSIS — R1013 Epigastric pain: Secondary | ICD-10-CM | POA: Diagnosis not present

## 2019-03-25 DIAGNOSIS — E785 Hyperlipidemia, unspecified: Secondary | ICD-10-CM

## 2019-03-25 MED ORDER — HYDROCODONE-ACETAMINOPHEN 10-325 MG PO TABS: 1.0000 | ORAL_TABLET | ORAL | 0 refills | Status: DC | PRN

## 2019-03-25 MED ORDER — PANTOPRAZOLE SODIUM 40 MG PO TBEC: 40.0000 mg | DELAYED_RELEASE_TABLET | Freq: Every day | ORAL | 3 refills | Status: DC

## 2019-03-25 NOTE — Progress Notes (Signed)
   Subjective:    Patient ID: [name], male    DOB: 09-Jul-1971, 48 y.o.   MRN: [id number] telephone Back Pain This is a recurrent problem. The current episode started in the past 7 days. Pertinent negatives include no abdominal pain, chest pain, dysuria, headaches or weakness. He has tried NSAIDs (Tylenol, prednisone, pain med) for the symptoms. The treatment provided mild relief.   Pt states that he is having left shoulder pain that radiates to back. No problem urinating. Pt has had some abdominal pain; feeling of fullness. Pt had gall bladder removed recently.   Virtual Visit via Video Note  I connected with [name] on 03/25/19 at  1:10 PM EDT by a video enabled telemedicine application and verified that I am speaking with the correct person using two identifiers. Patient having intermittent epigastric symptoms at times he feels very full like he is a a large meal when he really has not at other times he feels some slight discomfort he denies vomiting dysphagia melena hematochezia fever chills Location: Patient: home Provider: office   I discussed the limitations of evaluation and management by telemedicine and the availability of in person appointments. The patient expressed understanding and agreed to proceed.  History of Present Illness:    Observations/Objective:   Assessment and Plan:   Follow Up Instructions:    I discussed the assessment and treatment plan with the patient. The patient was provided an opportunity to ask questions and all were answered. The patient agreed with the plan and demonstrated an understanding of the instructions.   The patient was advised to call back or seek an in-person evaluation if the symptoms worsen or if the condition fails to improve as anticipated.  I provided 15 minutes of non-face-to-face time during this encounter.   Vicente Males, LPN   Review of Systems  Constitutional: Negative for activity change.  HENT:  Negative for congestion and rhinorrhea.   Respiratory: Negative for cough and shortness of breath.   Cardiovascular: Negative for chest pain.  Gastrointestinal: Negative for abdominal pain, diarrhea, nausea and vomiting.       Patient feels fullness in his abdomen but no true pain  Genitourinary: Negative for dysuria and hematuria.  Musculoskeletal: Positive for back pain.  Neurological: Negative for weakness and headaches.  Psychiatric/Behavioral: Negative for behavioral problems and confusion.       Objective:   Physical Exam  Today's visit was via telephone Physical exam was not possible for this visit       Assessment & Plan:  Dyspepsia-probably related to the prednisone and PPI there is concern he may be potentially developing an ulcer with his history of smoking referral to GI may need EGD  Chronic back pain and discomfort does not appear to be surgical might benefit from injections patient is willing to see pain management small number of pain medicine prescribed by [place] but our practice will not be doing chronic daily pain medicine for the patient  Slight aortic aneurysm by previous discussion follow-up ultrasound in 3 years patient encouraged strongly to quit smoking

## 2019-03-27 ENCOUNTER — Encounter: Payer: Self-pay | Admitting: Family Medicine

## 2019-03-31 ENCOUNTER — Encounter: Payer: Self-pay | Admitting: Family Medicine

## 2019-04-03 ENCOUNTER — Ambulatory Visit
Admission: EM | Admit: 2019-04-03 | Discharge: 2019-04-03 | Disposition: A | Discharge status: Home / Self Care | Payer: Medicare Other | Attending: Emergency Medicine | Admitting: Emergency Medicine

## 2019-04-03 ENCOUNTER — Other Ambulatory Visit: Payer: Self-pay

## 2019-04-03 DIAGNOSIS — Z765 Malingerer [conscious simulation]: Secondary | ICD-10-CM

## 2019-04-03 DIAGNOSIS — G8929 Other chronic pain: Secondary | ICD-10-CM

## 2019-04-03 DIAGNOSIS — M5441 Lumbago with sciatica, right side: Secondary | ICD-10-CM

## 2019-04-03 MED ORDER — PREDNISONE 20 MG PO TABS: 20.0000 mg | ORAL_TABLET | Freq: Two times a day (BID) | ORAL | 0 refills | Status: DC

## 2019-04-03 MED ORDER — METHYLPREDNISOLONE SODIUM SUCC 125 MG IJ SOLR
125.0000 mg | Freq: Once | INTRAMUSCULAR | Status: AC
  Administered 2019-04-03: 125 mg via INTRAMUSCULAR

## 2019-04-03 NOTE — ED Provider Notes (Signed)
[facility]   [id number] 04/03/19 Arrival Time: 1735  CC: Back pain   SUBJECTIVE: History from: patient. [name] is a 48 y.o. male hx significant for chronic back pain, H/O degenerative disc disease, and H/O spinal fusion, complains of acute on chronic back pain x 3-4 days.  Symptoms began after performing chores at home.  Localizes the pain to the RT buttock.  Describes the pain as constant and 10/10.  Was prescribed 20 norco on 03/25/19 by PCP, states he has already ran out of his medication.  Symptoms are made worse with walking.  Reports similar symptoms in the past that improved with norco.  Denies fever, chills, erythema, ecchymosis, effusion, weakness, numbness and tingling, saddle paresthesias, loss of bowel or bladder function.      ROS: As per HPI. All other pertinent ROS negative.    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    Current Outpatient Medications on File Prior to Encounter  Medication Sig Dispense Refill   albuterol (PROVENTIL HFA;VENTOLIN HFA) 108 (90 Base) MCG/ACT inhaler Inhale 2 puffs into the lungs every 6 (six) hours as needed for wheezing. 1 Inhaler 2   pantoprazole (PROTONIX) 40 MG tablet Take 1 tablet (40 mg total) by mouth  daily. 30 tablet 3   [DISCONTINUED] dicyclomine (BENTYL) 20 MG tablet Take 1 tablet (20 mg total) by mouth 2 (two) times daily. (Patient not taking: Reported on 10/07/2018) 20 tablet 0   Social History   Socioeconomic History   Marital status: Legally Separated    Spouse name: Not on file   Number of children: Not on file   Years of education: Not on file   Highest education level: Not on file  Occupational History   Not on file  Social Needs   Financial resource strain: Not on file   Food insecurity    Worry: Not on file    Inability: Not on file   Transportation needs    Medical: Not on file    Non-medical: Not on file  Tobacco Use   Smoking status: Current Every Day Smoker    Packs/day: 1.00    Types: Cigarettes   Smokeless tobacco: Never Used  Substance and Sexual Activity   Alcohol use: No   Drug use: No   Sexual activity: Not on file  Lifestyle   Physical activity    Days per week: Not on file    Minutes per session: Not on file   Stress: Not on file  Relationships   Social connections    Talks on phone: Not on file    Gets together: Not on file    Attends religious service: Not on file    Active member of club or organization: Not on  file    Attends meetings of clubs or organizations: Not on file    Relationship status: Not on file   Intimate partner violence    Fear of current or ex partner: Not on file    Emotionally abused: Not on file    Physically abused: Not on file    Forced sexual activity: Not on file  Other Topics Concern   Not on file  Social History Narrative   Not on file   History reviewed. No pertinent family history.  OBJECTIVE:  Vitals:   04/03/19 1744  BP: 123/86  Pulse: 93  Resp: (!) 22  Temp: 98.3 F (36.8 C)  SpO2: 92%    General appearance: ALERT; in no acute distress.  Head: NCAT Eyes: PERRL, EOMI grossly Lungs: Normal respiratory effort; CTAB CV: RRR Musculoskeletal: Back Inspection: Skin warm,  dry, clear and intact without obvious erythema, effusion, or ecchymosis.  Palpation: Diffusely TTP over RT lower back ROM: FROM active and passive Strength: 5/5 shld abduction, 5/5 shld adduction, 5/5 elbow flexion, 5/5 elbow extension, 5/5 grip strength, 5/5 hip flexion, 5/5 knee abduction, 5/5 knee adduction, 5/5 knee flexion, 5/5 knee extension, 5/5 dorsiflexion, 5/5 plantar flexion Skin: warm and dry Neurologic: Ambulates without difficulty; Sensation intact about the upper/ lower extremities Psychological: alert and cooperative; normal mood and affect   ASSESSMENT & PLAN:  1. Chronic bilateral low back pain with right-sided sciatica   2. Drug-seeking behavior     Meds ordered this encounter  Medications   methylPREDNISolone sodium succinate (SOLU-MEDROL) 125 mg/2 mL injection 125 mg   predniSONE (DELTASONE) 20 MG tablet    Sig: Take 1 tablet (20 mg total) by mouth 2 (two) times daily with a meal for 5 days.    Dispense:  10 tablet    Refill:  0    Order Specific Question:   Supervising Provider    Answer:   Raylene Everts [[id number]]   Steroid shot given in office Continue conservative management of rest, ice, heat, and gentle stretches/ massage Prednisone prescribed.  Take as directed and to completion Follow up with PCP if symptoms persist  Go to the ER if you have any new or worsening symptoms (fever, chills, chest pain, abdominal pain, changes in bowel or bladder habits, worsening symptoms despite treatment, etc...)   [place] Controlled Substances Registry consulted for this patient. I feel the risk/benefit ratio today is NOT favorable for proceeding with a prescription for a controlled substance.   Reviewed expectations re: course of current medical issues. Questions answered. Outlined signs and symptoms indicating need for more acute intervention. Patient verbalized understanding. After Visit Summary given.    Lestine Box, PA-C 04/03/19 2542

## 2019-04-03 NOTE — Discharge Instructions (Addendum)
Steroid shot given in office Continue conservative management of rest, ice, heat, and gentle stretches/ massage Prednisone prescribed.  Take as directed and to completion Follow up with PCP if symptoms persist  Go to the ER if you have any new or worsening symptoms (fever, chills, chest pain, abdominal pain, changes in bowel or bladder habits, worsening symptoms despite treatment, etc...)

## 2019-04-07 ENCOUNTER — Ambulatory Visit (INDEPENDENT_AMBULATORY_CARE_PROVIDER_SITE_OTHER): Payer: Medicare Other | Admitting: Family Medicine

## 2019-04-07 ENCOUNTER — Other Ambulatory Visit: Payer: Self-pay

## 2019-04-07 DIAGNOSIS — M5441 Lumbago with sciatica, right side: Secondary | ICD-10-CM

## 2019-04-07 DIAGNOSIS — G8929 Other chronic pain: Secondary | ICD-10-CM

## 2019-04-07 MED ORDER — PREDNISONE 20 MG PO TABS: 20.0000 mg | ORAL_TABLET | Freq: Two times a day (BID) | ORAL | 0 refills | Status: AC

## 2019-04-07 MED ORDER — HYDROCODONE-ACETAMINOPHEN 10-325 MG PO TABS: ORAL_TABLET | ORAL | 0 refills | Status: DC

## 2019-04-07 NOTE — Progress Notes (Signed)
Subjective:    Patient ID: [name], male    DOB: 01/25/1971, 48 y.o.   MRN: [id number]  HPI Telephone only Patient relates back pain discomfort going down his leg He is tried gabapentin before without relief He has tried anti-inflammatories without relief He has had previous surgery He has had an MRI which did not show any signs of necessary repeat in regards to surgery I have referred him to a pain management center.  They have tried multiple times getting hold to him without success The patient did go to the ER it was treated with steroids He relates the pain is worse today and is requesting help Patient calls with ongoing back pain. Patient went to ER on Friday for the pain and was given steroids ut they have not helped.  Virtual Visit via Video Note  I connected with [name] on 04/07/19 at  1:10 PM EDT by a video enabled telemedicine application and verified that I am speaking with the correct person using two identifiers.  Location: Patient: home Provider: office   I discussed the limitations of evaluation and management by telemedicine and the availability of in person appointments. The patient expressed understanding and agreed to proceed.  History of Present Illness:    Observations/Objective:   Assessment and Plan:   Follow Up Instructions:    I discussed the assessment and treatment plan with the patient. The patient was provided an opportunity to ask questions and all were answered. The patient agreed with the plan and demonstrated an understanding of the instructions.   The patient was advised to call back or seek an in-person evaluation if the symptoms worsen or if the condition fails to improve as anticipated.  I provided 15 minutes of non-face-to-face time during this encounter.        Review of Systems  Constitutional: Negative for activity change.  HENT: Negative for congestion and rhinorrhea.   Respiratory: Negative for cough and  shortness of breath.   Cardiovascular: Negative for chest pain.  Gastrointestinal: Negative for abdominal pain, diarrhea, nausea and vomiting.  Genitourinary: Negative for dysuria and hematuria.  Musculoskeletal: Positive for back pain. Negative for arthralgias.  Neurological: Negative for weakness and headaches.  Psychiatric/Behavioral: Negative for behavioral problems and confusion.       Objective:   Physical Exam Today's visit was via telephone Physical exam was not possible for this visit  Patient denies using any illegal medications.  Denies abusing medications.  Drug registry was checked.      Assessment & Plan:  Chronic low back pain failed treatment with gabapentin, anti-inflammatories Previous surgery Recent MRI I recommend that this patient go forward with having consultation with pain management More than likely will benefit being on medication but this needs to be monitored on a monthly basis with urine drug testing I explained to the patient that we will no longer be prescribing pain medication  Previously we had prescribed pain medication and because of some inconsistencies had to stop prescribing  Recently we have prescribed some as needed pain medication but it is noted that the patient is having ongoing severe pain and would benefit from having pain management Phone number for pain management given to the patient  A small amount of pain medicines was sent in along with prednisone the patient was encouraged to call pain management [facility] today to become established with them for pain management  We will continue to do primary care here but we will not  be prescribing pain medicines patient is aware of this

## 2019-04-13 DIAGNOSIS — Z79899 Other long term (current) drug therapy: Secondary | ICD-10-CM | POA: Diagnosis not present

## 2019-04-13 DIAGNOSIS — M545 Low back pain: Secondary | ICD-10-CM | POA: Diagnosis not present

## 2019-04-13 DIAGNOSIS — F17210 Nicotine dependence, cigarettes, uncomplicated: Secondary | ICD-10-CM | POA: Diagnosis not present

## 2019-04-13 DIAGNOSIS — I714 Abdominal aortic aneurysm, without rupture: Secondary | ICD-10-CM | POA: Diagnosis not present

## 2019-04-13 DIAGNOSIS — G8929 Other chronic pain: Secondary | ICD-10-CM | POA: Diagnosis not present

## 2019-04-20 ENCOUNTER — Telehealth: Payer: Self-pay | Admitting: Family Medicine

## 2019-04-20 NOTE — Telephone Encounter (Signed)
Request received sent back for doc approval

## 2019-05-18 NOTE — Progress Notes (Deleted)
Referring Provider: Babs SciaraLuking, Scott A, MD Primary Care Physician:  Babs SciaraLuking, Scott A, MD Primary Gastroenterologist:  Dr. Jena Gaussourk  No chief complaint on file.   HPI:   [name] is a 48 y.o. male presenting today at the request of Luking, Jonna CoupScott A, MD. Initial consult in June 2020 for screening for colon cancer.  A second consult has been placed in July 2020 for dyspepsia.  Past medical history significant for chronic back pain and cholecystectomy in September 2019.  Patient saw PCP on 03/25/2019 and complained of intermittent slight epigastric discomfort and some early satiety.  He had recently completed 9 day course of prednisone in late June for low back pain and sciatica.  Patient was started on Protonix 40 mg daily.   Today he states   Past Medical History:  Diagnosis Date  . Arthritis   . Chronic back pain   . History of degenerative disc disease   . History of spinal fusion     Past Surgical History:  Procedure Laterality Date  . 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 CHOLECYSTECTOMY N/A 06/13/2018   Procedure: LAPAROSCOPIC CHOLECYSTECTOMY;  Surgeon: Franky MachoJenkins, Mark, MD;  Location: [facility];  Service: General;  Laterality: N/A;  . SPINAL FUSION      Current Outpatient Medications  Medication Sig Dispense Refill  . albuterol (PROVENTIL HFA;VENTOLIN HFA) 108 (90 Base) MCG/ACT inhaler Inhale 2 puffs into the lungs every 6 (six) hours as needed for wheezing. 1 Inhaler 2  . HYDROcodone-acetaminophen (NORCO) 10-325 MG tablet 1 every 4 hours as needed severe pain 15 tablet 0  . pantoprazole (PROTONIX) 40 MG tablet Take 1 tablet (40 mg total) by mouth daily. 30 tablet 3   No current facility-administered medications for this visit.     Allergies as of 05/20/2019 - Review Complete 04/07/2019  Allergen Reaction Noted  . Toradol [ketorolac tromethamine]  Itching, Swelling, and Rash 12/11/2012    No family history on file.  Social History   Socioeconomic History  . Marital status: Legally Separated    Spouse name: Not on file  . Number of children: Not on file  . Years of education: Not on file  . Highest education level: Not on file  Occupational History  . Not on file  Social Needs  . Financial resource strain: Not on file  . Food insecurity    Worry: Not on file    Inability: Not on file  . Transportation needs    Medical: Not on file    Non-medical: Not on file  Tobacco Use  . Smoking status: Current Every Day Smoker    Packs/day: 1.00    Types: Cigarettes  . Smokeless tobacco: Never Used  Substance and Sexual Activity  . Alcohol use: No  . Drug use: No  . Sexual activity: Not on file  Lifestyle  . Physical activity    Days per week: Not on file    Minutes per session: Not on file  . Stress: Not on file  Relationships  . Social Musicianconnections    Talks on phone: Not on file    Gets together: Not on file    Attends religious service: Not on file    Active member of club or organization: Not on file    Attends meetings of clubs or organizations: Not on file    Relationship status: Not on  file  . Intimate partner violence    Fear of current or ex partner: Not on file    Emotionally abused: Not on file    Physically abused: Not on file    Forced sexual activity: Not on file  Other Topics Concern  . Not on file  Social History Narrative  . Not on file    Review of Systems: Gen: Denies any fever, chills, fatigue, weight loss, lack of appetite.  CV: Denies chest pain, heart palpitations, peripheral edema, syncope.  Resp: Denies shortness of breath at rest or with exertion. Denies wheezing or cough.  GI: Denies dysphagia or odynophagia. Denies jaundice, hematemesis, fecal incontinence. GU : Denies urinary burning, urinary frequency, urinary hesitancy MS: Denies joint pain, muscle weakness, cramps, or limitation of  movement.  Derm: Denies rash, itching, dry skin Psych: Denies depression, anxiety, memory loss, and confusion Heme: Denies bruising, bleeding, and enlarged lymph nodes.  Physical Exam: There were no vitals taken for this visit. General:   Alert and oriented. Pleasant and cooperative. Well-nourished and well-developed.  Head:  Normocephalic and atraumatic. Eyes:  Without icterus, sclera clear and conjunctiva pink.  Ears:  Normal auditory acuity. Nose:  No deformity, discharge,  or lesions. Mouth:  No deformity or lesions, oral mucosa pink.  Neck:  Supple, without mass or thyromegaly. Lungs:  Clear to auscultation bilaterally. No wheezes, rales, or rhonchi. No distress.  Heart:  S1, S2 present without murmurs appreciated.  Abdomen:  +BS, soft, non-tender and non-distended. No HSM noted. No guarding or rebound. No masses appreciated.  Rectal:  Deferred  Msk:  Symmetrical without gross deformities. Normal posture. Pulses:  Normal pulses noted. Extremities:  Without clubbing or edema. Neurologic:  Alert and  oriented x4;  grossly normal neurologically. Skin:  Intact without significant lesions or rashes. Cervical Nodes:  No significant cervical adenopathy. Psych:  Alert and cooperative. Normal mood and affect.

## 2019-05-20 ENCOUNTER — Ambulatory Visit: Payer: Medicare Other | Admitting: Gastroenterology

## 2019-05-20 DIAGNOSIS — Z7901 Long term (current) use of anticoagulants: Secondary | ICD-10-CM | POA: Diagnosis not present

## 2019-05-20 DIAGNOSIS — R0902 Hypoxemia: Secondary | ICD-10-CM | POA: Diagnosis not present

## 2019-05-20 DIAGNOSIS — Z029 Encounter for administrative examinations, unspecified: Secondary | ICD-10-CM

## 2019-05-20 DIAGNOSIS — R109 Unspecified abdominal pain: Secondary | ICD-10-CM | POA: Diagnosis not present

## 2019-05-20 DIAGNOSIS — D696 Thrombocytopenia, unspecified: Secondary | ICD-10-CM | POA: Diagnosis not present

## 2019-05-20 DIAGNOSIS — Z79899 Other long term (current) drug therapy: Secondary | ICD-10-CM | POA: Diagnosis not present

## 2019-05-20 DIAGNOSIS — F17210 Nicotine dependence, cigarettes, uncomplicated: Secondary | ICD-10-CM | POA: Diagnosis not present

## 2019-05-27 ENCOUNTER — Encounter: Payer: Self-pay | Admitting: Family Medicine

## 2019-05-27 ENCOUNTER — Other Ambulatory Visit: Payer: Self-pay

## 2019-05-27 ENCOUNTER — Ambulatory Visit (INDEPENDENT_AMBULATORY_CARE_PROVIDER_SITE_OTHER): Payer: Medicare Other | Admitting: Family Medicine

## 2019-05-27 DIAGNOSIS — R197 Diarrhea, unspecified: Secondary | ICD-10-CM | POA: Diagnosis not present

## 2019-05-27 MED ORDER — CHOLESTYRAMINE 4 GM/DOSE PO POWD: ORAL | 5 refills | Status: DC

## 2019-05-27 NOTE — Progress Notes (Signed)
   Subjective:    Patient ID: [name], male    DOB: March 12, 1971, 48 y.o.   MRN: [id number]  HPIVomiting and diarrhea for about one year. Feels like he is full all the time at times. Stool is like pure water at times. Abdominal pain comes and goes. Pain is all over but mostly where his gallbladder use to be.  Patient unfortunately with frequent vomiting diarrhea in addition to this feels rundown and tired fairly profuse and ongoing ever since having his gallbladder taken out last September he denies mucus denies blood in it denies abdominal swelling does get intermittent constipation and intermittent nausea and vomiting.  Has tried various diet and avoid milk related products Virtual Visit via Telephone Note  I connected with [name] on 05/27/19 at  3:00 PM EDT by telephone and verified that I am speaking with the correct person using two identifiers.  Location: Patient: home Provider: office   I discussed the limitations, risks, security and privacy concerns of performing an evaluation and management service by telephone and the availability of in person appointments. I also discussed with the patient that there may be a patient responsible charge related to this service. The patient expressed understanding and agreed to proceed.   History of Present Illness:    Observations/Objective:   Assessment and Plan:   Follow Up Instructions:    I discussed the assessment and treatment plan with the patient. The patient was provided an opportunity to ask questions and all were answered. The patient agreed with the plan and demonstrated an understanding of the instructions.   The patient was advised to call back or seek an in-person evaluation if the symptoms worsen or if the condition fails to improve as anticipated.  I provided 16 minutes of non-face-to-face time during this encounter.      Review of Systems  Constitutional: Negative for activity change, fatigue and  fever.  HENT: Negative for congestion and rhinorrhea.   Respiratory: Negative for cough and shortness of breath.   Cardiovascular: Negative for chest pain and leg swelling.  Gastrointestinal: Positive for diarrhea, nausea and vomiting. Negative for abdominal pain.  Genitourinary: Negative for dysuria and hematuria.  Neurological: Negative for weakness and headaches.  Psychiatric/Behavioral: Negative for agitation and behavioral problems.       Objective:   Physical Exam   Today's visit was via telephone Physical exam was not possible for this visit      Assessment & Plan:  Telephone only Irritable bowel with diarrhea Possibility of gallbladder related diarrhea Try cholestyramine powder twice daily on days he is having diarrhea Keep follow-up visit with gastroenterology Patient more than likely will need EGD as well as colonoscopy patient aware and will discuss with gastroenterology

## 2019-05-29 ENCOUNTER — Other Ambulatory Visit: Payer: Self-pay

## 2019-05-29 ENCOUNTER — Encounter (HOSPITAL_COMMUNITY): Payer: Self-pay | Admitting: Emergency Medicine

## 2019-05-29 ENCOUNTER — Emergency Department (HOSPITAL_COMMUNITY)
Admission: EM | Admit: 2019-05-29 | Discharge: 2019-05-29 | Disposition: A | Discharge status: Home / Self Care | Payer: Medicare Other | Attending: Emergency Medicine | Admitting: Emergency Medicine

## 2019-05-29 DIAGNOSIS — K029 Dental caries, unspecified: Secondary | ICD-10-CM | POA: Diagnosis not present

## 2019-05-29 DIAGNOSIS — F17210 Nicotine dependence, cigarettes, uncomplicated: Secondary | ICD-10-CM | POA: Diagnosis not present

## 2019-05-29 DIAGNOSIS — K0889 Other specified disorders of teeth and supporting structures: Secondary | ICD-10-CM | POA: Insufficient documentation

## 2019-05-29 MED ORDER — CLINDAMYCIN HCL 150 MG PO CAPS: ORAL_CAPSULE | ORAL | 0 refills | Status: DC

## 2019-05-29 NOTE — ED Provider Notes (Signed)
[facility] EMERGENCY DEPARTMENT Provider Note   CSN: [id number] Arrival date & time: 05/29/19  0751     History   Chief Complaint Chief Complaint  Patient presents with   Dental Pain    HPI [name] is a 48 y.o. male.     HPI  Pt was seen at 0835. Per pt, c/o gradual onset and persistence of constant right lower tooth "pain" for the past 3 days.  Denies fevers, no intra-oral edema, no rash, no facial swelling, no dysphagia, no neck pain.   The condition is aggravated by nothing. The condition is relieved by nothing. The symptoms have been associated with no other complaints.    Past Medical History:  Diagnosis Date   Arthritis    Chronic back pain    History of degenerative disc disease    History of spinal fusion     Patient Active Problem List   Diagnosis Date Noted   Abdominal aortic aneurysm (AAA) without rupture ([place]) 03/11/2019   Calculus of gallbladder without cholecystitis without obstruction    Closed displaced fracture of fourth metacarpal bone of right hand, initial encounter  07/29/17 08/29/2017   Radicular pain in right arm 04/22/2016   Hyperlipidemia 10/16/2013   Allergic rhinitis 01/09/2013   Chronic low back pain 12/17/2012    Past Surgical History:  Procedure Laterality Date   AMPUTATION Left 04/28/2014   Procedure: 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;  Surgeon: Marcheta Grammes, DPM;  Location: [facility];  Service: Podiatry;  Laterality: Left;   CHOLECYSTECTOMY N/A 06/13/2018   Procedure: LAPAROSCOPIC CHOLECYSTECTOMY;  Surgeon: Aviva Signs, MD;  Location: [facility];  Service: General;  Laterality: N/A;   SPINAL FUSION          Home Medications    Prior to Admission medications   Medication Sig Start Date End Date Taking? Authorizing Provider  albuterol (PROVENTIL HFA;VENTOLIN HFA) 108 (90 Base) MCG/ACT inhaler Inhale 2 puffs into the lungs every 6 (six) hours as needed for wheezing. Patient not taking: Reported on 05/27/2019  09/12/18   Kathyrn Drown, MD  cholestyramine Lucrezia Starch) 4 GM/DOSE powder 1 scoop Twice daily with meals mix with water or juice prn diarrhea 05/27/19   Kathyrn Drown, MD  dicyclomine (BENTYL) 20 MG tablet Take 1 tablet (20 mg total) by mouth 2 (two) times daily. Patient not taking: Reported on 10/07/2018 06/05/18 04/03/19  Aviva Signs, MD    Family History No family history on file.  Social History Social History   Tobacco Use   Smoking status: Current Every Day Smoker    Packs/day: 1.00    Types: Cigarettes   Smokeless tobacco: Never Used  Substance Use Topics   Alcohol use: No   Drug use: No     Allergies   Toradol [ketorolac tromethamine]   Review of Systems Review of Systems   Physical Exam Updated Vital Signs BP 104/76 (BP Location: Right Arm)    Pulse 76    Temp 98.7 F (37.1 C) (Oral)    Resp 18    Ht 6\' 1"  (1.854 m)    Wt 93 kg    SpO2 100%    BMI 27.05 kg/m   Physical Exam 0840: Physical examination: Vital signs and O2 SAT: Reviewed; Constitutional: Well developed, Well nourished, Well hydrated, In no acute distress; Head and Face: Normocephalic, Atraumatic; Eyes: EOMI, PERRL, No scleral icterus; ENMT: Mouth and pharynx normal, Poor dentition, Widespread dental decay, Left TM normal, Right TM normal, Mucous membranes moist, +  lower right left 2nd molar with extensive dental decay.  No gingival erythema, edema, fluctuance, or drainage.  No intra-oral edema. No submandibular or sublingual edema. No hoarse voice, no drooling, no stridor. No trismus. ; Neck: Supple, Full range of motion, No lymphadenopathy; Cardiovascular: Regular rate and rhythm, No gallop; Respiratory: Breath sounds clear & equal bilaterally, No wheezes, Normal respiratory effort/excursion; Chest: Nontender, Movement normal; Extremities: Pulses normal, No tenderness, No edema; Neuro: AA&Ox3, Major CN grossly intact.  No gross focal motor or sensory deficits in extremities.; Skin: Color normal, No  rash, No petechiae, Warm, Dry   ED Treatments / Results  Labs (all labs ordered are listed, but only abnormal results are displayed)   EKG None  Radiology   Procedures Procedures (including critical care time)  Medications Ordered in ED Medications - No data to display   Initial Impression / Assessment and Plan / ED Course  I have reviewed the triage vital signs and the nursing notes.  Pertinent labs & imaging results that were available during my care of the patient were reviewed by me and considered in my medical decision making (see chart for details).       MDM Reviewed: previous chart, nursing note and vitals    0845:  Pt has already filled narcotic rx on 05/20/19 (#110 hydrocodone 10/APAP 350). Pt encouraged to f/u with dentist or oral surgeon for his dental needs for good continuity of care and definitive treatment.  Pt verb understanding.     Final Clinical Impressions(s) / ED Diagnoses   Final diagnoses:  None    ED Discharge Orders    None       Samuel JesterMcManus, Aziya Arena, DO 06/02/19 1041

## 2019-05-29 NOTE — Discharge Instructions (Signed)
Take the prescription as directed.  Call the dentist today to schedule a follow up appointment within the next week.  Return to the Emergency Department immediately sooner if worsening.

## 2019-05-29 NOTE — ED Triage Notes (Signed)
Patient complains of right sided dental pain x 2 days.

## 2019-06-11 ENCOUNTER — Telehealth: Payer: Self-pay | Admitting: Family Medicine

## 2019-06-11 MED ORDER — PENICILLIN V POTASSIUM 500 MG PO TABS: ORAL_TABLET | ORAL | 0 refills | Status: DC

## 2019-06-11 NOTE — Telephone Encounter (Signed)
Penicillin VK 500 mg 1 4 times daily for 7 days Unable to do pain medicine

## 2019-06-11 NOTE — Telephone Encounter (Signed)
Antibiotic sent in and pt is aware

## 2019-06-11 NOTE — Telephone Encounter (Signed)
Please advise. Thank you

## 2019-06-11 NOTE — Telephone Encounter (Signed)
Pt called, states he broke a tooth a couple months ago & went to the ER 05/29/2019, was given antibiotics  States he's been unable to get in to see a dentist   Pt requesting another round of antibiotics & pain meds for tooth  Please advise & call pt    [facility]

## 2019-06-21 NOTE — Progress Notes (Deleted)
Referring Provider: Kathyrn Drown, MD Primary Care Physician:  Kathyrn Drown, MD Primary Gastroenterologist:  Dr. Gala Romney  No chief complaint on file.   HPI:   [name] is a 48 y.o. male presenting today at the request of Luking, Elayne Snare, MD for consult colonoscopy and dyspepsia.   History of long term pain medication use for chronic low back pain.   Past Medical History:  Diagnosis Date  . Arthritis   . Chronic back pain   . History of degenerative disc disease   . History of spinal fusion     Past Surgical History:  Procedure Laterality Date  . AMPUTATION Left 04/28/2014   Procedure: 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;  Surgeon: Marcheta Grammes, DPM;  Location: [facility];  Service: Podiatry;  Laterality: Left;  . CHOLECYSTECTOMY N/A 06/13/2018   Procedure: LAPAROSCOPIC CHOLECYSTECTOMY;  Surgeon: Aviva Signs, MD;  Location: [facility];  Service: General;  Laterality: N/A;  . SPINAL FUSION      Current Outpatient Medications  Medication Sig Dispense Refill  . albuterol (PROVENTIL HFA;VENTOLIN HFA) 108 (90 Base) MCG/ACT inhaler Inhale 2 puffs into the lungs every 6 (six) hours as needed for wheezing. (Patient not taking: Reported on 05/27/2019) 1 Inhaler 2  . cholestyramine (QUESTRAN) 4 GM/DOSE powder 1 scoop Twice daily with meals mix with water or juice prn diarrhea 378 g 5  . clindamycin (CLEOCIN) 150 MG capsule 3 tabs PO TID x 10 days 90 capsule 0  . penicillin v potassium (VEETID) 500 MG tablet Take one tablet po 4 times a day for 7 days 28 tablet 0   No current facility-administered medications for this visit.     Allergies as of 06/22/2019 - Review Complete 05/29/2019  Allergen Reaction Noted  . Toradol [ketorolac tromethamine] Itching, Swelling, and Rash 12/11/2012    No family history on file.  Social History   Socioeconomic History  . Marital status: Legally Separated    Spouse name: Not on file  . Number of children: Not on file  . Years of  education: Not on file  . Highest education level: Not on file  Occupational History  . Not on file  Social Needs  . Financial resource strain: Not on file  . Food insecurity    Worry: Not on file    Inability: Not on file  . Transportation needs    Medical: Not on file    Non-medical: Not on file  Tobacco Use  . Smoking status: Current Every Day Smoker    Packs/day: 1.00    Types: Cigarettes  . Smokeless tobacco: Never Used  Substance and Sexual Activity  . Alcohol use: No  . Drug use: No  . Sexual activity: Not on file  Lifestyle  . Physical activity    Days per week: Not on file    Minutes per session: Not on file  . Stress: Not on file  Relationships  . Social Herbalist on phone: Not on file    Gets together: Not on file    Attends religious service: Not on file    Active member of club or organization: Not on file    Attends meetings of clubs or organizations: Not on file    Relationship status: Not on file  . Intimate partner violence    Fear of current or ex partner: Not on file    Emotionally abused: Not on file    Physically abused: Not on file  Forced sexual activity: Not on file  Other Topics Concern  . Not on file  Social History Narrative  . Not on file    Review of Systems: Gen: Denies any fever, chills, fatigue, weight loss, lack of appetite.  CV: Denies chest pain, heart palpitations, peripheral edema, syncope.  Resp: Denies shortness of breath at rest or with exertion. Denies wheezing or cough.  GI: Denies dysphagia or odynophagia. Denies jaundice, hematemesis, fecal incontinence. GU : Denies urinary burning, urinary frequency, urinary hesitancy MS: Denies joint pain, muscle weakness, cramps, or limitation of movement.  Derm: Denies rash, itching, dry skin Psych: Denies depression, anxiety, memory loss, and confusion Heme: Denies bruising, bleeding, and enlarged lymph nodes.  Physical Exam: There were no vitals taken for this  visit. General:   Alert and oriented. Pleasant and cooperative. Well-nourished and well-developed.  Head:  Normocephalic and atraumatic. Eyes:  Without icterus, sclera clear and conjunctiva pink.  Ears:  Normal auditory acuity. Nose:  No deformity, discharge,  or lesions. Mouth:  No deformity or lesions, oral mucosa pink.  Neck:  Supple, without mass or thyromegaly. Lungs:  Clear to auscultation bilaterally. No wheezes, rales, or rhonchi. No distress.  Heart:  S1, S2 present without murmurs appreciated.  Abdomen:  +BS, soft, non-tender and non-distended. No HSM noted. No guarding or rebound. No masses appreciated.  Rectal:  Deferred  Msk:  Symmetrical without gross deformities. Normal posture. Pulses:  Normal pulses noted. Extremities:  Without clubbing or edema. Neurologic:  Alert and  oriented x4;  grossly normal neurologically. Skin:  Intact without significant lesions or rashes. Cervical Nodes:  No significant cervical adenopathy. Psych:  Alert and cooperative. Normal mood and affect.

## 2019-06-22 ENCOUNTER — Telehealth: Payer: Self-pay | Admitting: Internal Medicine

## 2019-06-22 ENCOUNTER — Encounter: Payer: Self-pay | Admitting: Internal Medicine

## 2019-06-22 ENCOUNTER — Ambulatory Visit: Payer: Medicare Other | Admitting: Gastroenterology

## 2019-06-22 DIAGNOSIS — F17210 Nicotine dependence, cigarettes, uncomplicated: Secondary | ICD-10-CM | POA: Diagnosis not present

## 2019-06-22 DIAGNOSIS — M549 Dorsalgia, unspecified: Secondary | ICD-10-CM | POA: Diagnosis not present

## 2019-06-22 DIAGNOSIS — M62838 Other muscle spasm: Secondary | ICD-10-CM | POA: Diagnosis not present

## 2019-06-22 DIAGNOSIS — Z79899 Other long term (current) drug therapy: Secondary | ICD-10-CM | POA: Diagnosis not present

## 2019-06-22 DIAGNOSIS — G8929 Other chronic pain: Secondary | ICD-10-CM | POA: Diagnosis not present

## 2019-06-22 NOTE — Telephone Encounter (Signed)
Patient was a no show and letter sent  °

## 2019-07-24 DIAGNOSIS — Z79899 Other long term (current) drug therapy: Secondary | ICD-10-CM | POA: Diagnosis not present

## 2019-07-24 DIAGNOSIS — F17210 Nicotine dependence, cigarettes, uncomplicated: Secondary | ICD-10-CM | POA: Diagnosis not present

## 2019-07-27 ENCOUNTER — Emergency Department (HOSPITAL_COMMUNITY)
Admission: EM | Admit: 2019-07-27 | Discharge: 2019-07-27 | Disposition: A | Discharge status: Home / Self Care | Payer: Medicare Other | Attending: Emergency Medicine | Admitting: Emergency Medicine

## 2019-07-27 ENCOUNTER — Encounter (HOSPITAL_COMMUNITY): Payer: Self-pay | Admitting: Emergency Medicine

## 2019-07-27 ENCOUNTER — Other Ambulatory Visit: Payer: Self-pay

## 2019-07-27 DIAGNOSIS — F17210 Nicotine dependence, cigarettes, uncomplicated: Secondary | ICD-10-CM | POA: Insufficient documentation

## 2019-07-27 DIAGNOSIS — M545 Low back pain, unspecified: Secondary | ICD-10-CM

## 2019-07-27 DIAGNOSIS — Z79899 Other long term (current) drug therapy: Secondary | ICD-10-CM | POA: Diagnosis not present

## 2019-07-27 DIAGNOSIS — M549 Dorsalgia, unspecified: Secondary | ICD-10-CM | POA: Diagnosis present

## 2019-07-27 MED ORDER — HYDROMORPHONE HCL 1 MG/ML IJ SOLN
1.0000 mg | Freq: Once | INTRAMUSCULAR | Status: AC
  Administered 2019-07-27: 1 mg via INTRAMUSCULAR
  Filled 2019-07-27: qty 1

## 2019-07-27 NOTE — ED Triage Notes (Signed)
Pt c/o lower back pain that radiates down bilateral legs x 2 days.

## 2019-07-27 NOTE — ED Provider Notes (Signed)
[facility] EMERGENCY DEPARTMENT Provider Note   CSN: [id number] Arrival date & time: 07/27/19  2011     History   Chief Complaint Chief Complaint  Patient presents with  . Back Pain    HPI [name] is a 48 y.o. male.     The history is provided by the patient. No language interpreter was used.  Back Pain Location:  Lumbar spine Quality:  Aching Pain severity:  Moderate Pain is:  Same all the time Onset quality:  Gradual Timing:  Constant Progression:  Worsening Chronicity:  Chronic Relieved by:  Nothing Worsened by:  Nothing Ineffective treatments:  None tried Pt complains of exacerbation of back pain tonight.  Pt reports he has chronic back pain.  Pt had a spinal fusion over 10 years ago.  Pt states he is out of pain medication.  Pt's reports show he is followed by pain mangement.  Pt reports it is to hard to go.   Past Medical History:  Diagnosis Date  . Arthritis   . Chronic back pain   . History of degenerative disc disease   . History of spinal fusion     Patient Active Problem List   Diagnosis Date Noted  . Abdominal aortic aneurysm (AAA) without rupture ([facility]) 03/11/2019  . Calculus of gallbladder without cholecystitis without obstruction   . Closed displaced fracture of fourth metacarpal bone of right hand, initial encounter  07/29/17 08/29/2017  . Radicular pain in right arm 04/22/2016  . Hyperlipidemia 10/16/2013  . Allergic rhinitis 01/09/2013  . Chronic low back pain 12/17/2012    Past Surgical History:  Procedure Laterality Date  . 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 CHOLECYSTECTOMY N/A 06/13/2018   Procedure: LAPAROSCOPIC CHOLECYSTECTOMY;  Surgeon: Franky Macho, MD;  Location: [facility];  Service: General;  Laterality: N/A;  . SPINAL FUSION          Home Medications    Prior to Admission medications    Medication Sig Start Date End Date Taking? Authorizing Provider  albuterol (PROVENTIL HFA;VENTOLIN HFA) 108 (90 Base) MCG/ACT inhaler Inhale 2 puffs into the lungs every 6 (six) hours as needed for wheezing. Patient not taking: Reported on 05/27/2019 09/12/18   Babs Sciara, MD  cholestyramine Lanetta Inch) 4 GM/DOSE powder 1 scoop Twice daily with meals mix with water or juice prn diarrhea 05/27/19   Babs Sciara, MD  clindamycin (CLEOCIN) 150 MG capsule 3 tabs PO TID x 10 days 05/29/19   Samuel Jester, DO  penicillin v potassium (VEETID) 500 MG tablet Take one tablet po 4 times a day for 7 days 06/11/19   Babs Sciara, MD  dicyclomine (BENTYL) 20 MG tablet Take 1 tablet (20 mg total) by mouth 2 (two) times daily. Patient not taking: Reported on 10/07/2018 06/05/18 04/03/19  Franky Macho, MD    Family History No family history on file.  Social History Social History   Tobacco Use  . Smoking status: Current Every Day Smoker    Packs/day: 1.00    Types: Cigarettes  . Smokeless tobacco: Never Used  Substance Use Topics  . Alcohol use: No  . Drug use: No     Allergies   Toradol [ketorolac tromethamine]   Review of Systems Review of Systems  Musculoskeletal: Positive for back pain.  All other systems reviewed and are negative.    Physical  Exam Updated Vital Signs BP 130/79 (BP Location: Right Arm)   Pulse 84   Temp 98.5 F (36.9 C) (Oral)   Resp 18   Ht 6\' 1"  (1.854 m)   Wt 90.7 kg   SpO2 93%   BMI 26.39 kg/m   Physical Exam Vitals signs and nursing note reviewed.  Constitutional:      Appearance: He is well-developed.  HENT:     Head: Normocephalic and atraumatic.  Eyes:     Conjunctiva/sclera: Conjunctivae normal.  Neck:     Musculoskeletal: Neck supple.  Cardiovascular:     Rate and Rhythm: Normal rate and regular rhythm.     Heart sounds: No murmur.  Pulmonary:     Effort: Pulmonary effort is normal. No respiratory distress.     Breath sounds:  Normal breath sounds.  Abdominal:     General: Abdomen is flat.     Palpations: Abdomen is soft.     Tenderness: There is no abdominal tenderness.  Musculoskeletal:        General: Tenderness present.     Comments: Tender lower lumbar spine  Pain diffusly to palpation    Skin:    General: Skin is warm and dry.  Neurological:     General: No focal deficit present.     Mental Status: He is alert.  Psychiatric:        Mood and Affect: Mood normal.      ED Treatments / Results  Labs (all labs ordered are listed, but only abnormal results are displayed) Labs Reviewed - No data to display  EKG None  Radiology No results found.  Procedures Procedures (including critical care time)  Medications Ordered in ED Medications  HYDROmorphone (DILAUDID) injection 1 mg (1 mg Intramuscular Given 07/27/19 2220)     Initial Impression / Assessment and Plan / ED Course  I have reviewed the triage vital signs and the nursing notes.  Pertinent labs & imaging results that were available during my care of the patient were reviewed by me and considered in my medical decision making (see chart for details).        Pt given Dilaudid 1mg  IM.  Pt advised to call his MD tomorrow to discuss his pain mangement.   PMP reviewed, An After Visit Summary was printed and given to the patient.  Final Clinical Impressions(s) / ED Diagnoses   Final diagnoses:  Acute bilateral low back pain, unspecified whether sciatica present    ED Discharge Orders    None       Sidney Ace 07/27/19 2230    Noemi Chapel, MD 07/29/19 [phone]

## 2019-07-27 NOTE — Discharge Instructions (Signed)
Discuss pain management with your Provider.  You will need to let your pain management doctor know that you were given a pain injection here tonight so that they are aware.  The Emergency Department does not do chronic pain management.

## 2019-08-12 DIAGNOSIS — Z79899 Other long term (current) drug therapy: Secondary | ICD-10-CM | POA: Diagnosis not present

## 2019-08-12 DIAGNOSIS — R892 Abnormal level of other drugs, medicaments and biological substances in specimens from other organs, systems and tissues: Secondary | ICD-10-CM | POA: Diagnosis not present

## 2019-08-12 DIAGNOSIS — G8929 Other chronic pain: Secondary | ICD-10-CM | POA: Diagnosis not present

## 2019-08-12 DIAGNOSIS — M545 Low back pain: Secondary | ICD-10-CM | POA: Diagnosis not present

## 2019-08-19 ENCOUNTER — Emergency Department (HOSPITAL_COMMUNITY): Payer: Medicare Other

## 2019-08-19 ENCOUNTER — Encounter (HOSPITAL_COMMUNITY): Payer: Self-pay

## 2019-08-19 ENCOUNTER — Other Ambulatory Visit: Payer: Self-pay

## 2019-08-19 ENCOUNTER — Emergency Department (HOSPITAL_COMMUNITY)
Admission: EM | Admit: 2019-08-19 | Discharge: 2019-08-19 | Disposition: A | Discharge status: Home / Self Care | Payer: Medicare Other | Attending: Emergency Medicine | Admitting: Emergency Medicine

## 2019-08-19 DIAGNOSIS — W19XXXA Unspecified fall, initial encounter: Secondary | ICD-10-CM | POA: Insufficient documentation

## 2019-08-19 DIAGNOSIS — M546 Pain in thoracic spine: Secondary | ICD-10-CM | POA: Diagnosis not present

## 2019-08-19 DIAGNOSIS — S299XXA Unspecified injury of thorax, initial encounter: Secondary | ICD-10-CM | POA: Diagnosis not present

## 2019-08-19 DIAGNOSIS — Z79899 Other long term (current) drug therapy: Secondary | ICD-10-CM | POA: Diagnosis not present

## 2019-08-19 DIAGNOSIS — S4992XA Unspecified injury of left shoulder and upper arm, initial encounter: Secondary | ICD-10-CM | POA: Diagnosis not present

## 2019-08-19 DIAGNOSIS — F17210 Nicotine dependence, cigarettes, uncomplicated: Secondary | ICD-10-CM | POA: Diagnosis not present

## 2019-08-19 DIAGNOSIS — M25512 Pain in left shoulder: Secondary | ICD-10-CM | POA: Insufficient documentation

## 2019-08-19 DIAGNOSIS — R0781 Pleurodynia: Secondary | ICD-10-CM | POA: Diagnosis not present

## 2019-08-19 MED ORDER — HYDROMORPHONE HCL 2 MG/ML IJ SOLN
2.0000 mg | Freq: Once | INTRAMUSCULAR | Status: AC
  Administered 2019-08-19: 2 mg via INTRAMUSCULAR
  Filled 2019-08-19: qty 1

## 2019-08-19 NOTE — ED Provider Notes (Signed)
[facility]ENCY [facility]PARTMENT Provider Note   CSN: [id number] Arrival date & time: 08/19/19  [id number]     History   Chief Complaint Chief Complaint  Patient presents with  . Fall    HPI [name] is a 48 y.o. male.      Patient states he had a fall on Sunday.  Presenting with left shoulder pain left posterior thoracic back pain.  Denies any numbness or weakness to the left hand.  Denies any neck pain.  Patient states it hurts to cough hurts to take a deep breath per tickly on the left posterior rib area.  Patient's been seen in the past for cervical radiculopathy and chronic low back pain.  Patient last seen with a complaint of low back pain on November 9.  No head injury with a fall no loss of consciousness.  Patient states he has a smoker's cough.  Denies any fevers.     Past Medical History:  Diagnosis Date  . Arthritis   . Chronic back pain   . History of degenerative disc disease   . History of spinal fusion     Patient Active Problem List   Diagnosis Date Noted  . Abdominal aortic aneurysm (AAA) without rupture (HCC) 03/11/2019  . Calculus of gallbladder without cholecystitis without obstruction   . Closed displaced fracture of fourth metacarpal bone of right hand, initial encounter  07/29/17 08/29/2017  . Radicular pain in right arm 04/22/2016  . Hyperlipidemia 10/16/2013  . Allergic rhinitis 01/09/2013  . Chronic low back pain 12/17/2012    Past Surgical History:  Procedure Laterality Date  . 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 CHOLECYSTECTOMY N/A 06/13/2018   Procedure: LAPAROSCOPIC CHOLECYSTECTOMY;  Surgeon: Franky MachoJenkins, Mark, MD;  Location: [facility];  Service: General;  Laterality: N/A;  . SPINAL FUSION          Home Medications    Prior to Admission medications   Medication Sig Start Date End Date Taking? Authorizing  Provider  albuterol (PROVENTIL HFA;VENTOLIN HFA) 108 (90 Base) MCG/ACT inhaler Inhale 2 puffs into the lungs every 6 (six) hours as needed for wheezing. Patient not taking: Reported on 05/27/2019 09/12/18   Babs SciaraLuking, Eriyonna Matsushita A, MD  cholestyramine Lanetta Inch(QUESTRAN) 4 GM/DOSE powder 1 scoop Twice daily with meals mix with water or juice prn diarrhea 05/27/19   Babs SciaraLuking, Elian Gloster A, MD  clindamycin (CLEOCIN) 150 MG capsule 3 tabs PO TID x 10 days 05/29/19   Samuel JesterMcManus, Kathleen, DO  penicillin v potassium (VEETID) 500 MG tablet Take one tablet po 4 times a day for 7 days 06/11/19   Babs SciaraLuking, Kaiel Weide A, MD  dicyclomine (BENTYL) 20 MG tablet Take 1 tablet (20 mg total) by mouth 2 (two) times daily. Patient not taking: Reported on 10/07/2018 06/05/18 04/03/19  Franky MachoJenkins, Mark, MD    Family History No family history on file.  Social History Social History   Tobacco Use  . Smoking status: Current Every Day Smoker    Packs/day: 1.00    Types: Cigarettes  . Smokeless tobacco: Never Used  Substance Use Topics  . Alcohol use: No  . Drug use: No     Allergies   Toradol [ketorolac tromethamine]   Review of Systems Review of Systems  Constitutional: Negative for chills and fever.  HENT: Negative for congestion, rhinorrhea and sore throat.   Eyes: Negative for visual  disturbance.  Respiratory: Positive for cough. Negative for shortness of breath.   Cardiovascular: Negative for chest pain and leg swelling.  Gastrointestinal: Negative for abdominal pain, diarrhea, nausea and vomiting.  Genitourinary: Negative for dysuria.  Musculoskeletal: Positive for back pain. Negative for neck pain.  Skin: Negative for rash.  Neurological: Negative for dizziness, weakness, light-headedness, numbness and headaches.  Hematological: Does not bruise/bleed easily.  Psychiatric/Behavioral: Negative for confusion.     Physical Exam Updated Vital Signs BP (!) 129/96 (BP Location: Right Arm)   Pulse 76   Temp 98.7 F (37.1 C) (Oral)    Resp 18   Ht 1.854 m (6\' 1" )   SpO2 97%   BMI 26.39 kg/m   Physical Exam Vitals signs and nursing note reviewed.  Constitutional:      Appearance: Normal appearance. He is well-developed.  HENT:     Head: Normocephalic and atraumatic.  Eyes:     Extraocular Movements: Extraocular movements intact.     Conjunctiva/sclera: Conjunctivae normal.     Pupils: Pupils are equal, round, and reactive to light.  Neck:     Musculoskeletal: Normal range of motion and neck supple. No muscular tenderness.  Cardiovascular:     Rate and Rhythm: Normal rate and regular rhythm.     Heart sounds: No murmur.  Pulmonary:     Effort: Pulmonary effort is normal. No respiratory distress.     Breath sounds: Normal breath sounds.  Abdominal:     Palpations: Abdomen is soft.     Tenderness: There is no abdominal tenderness.  Musculoskeletal:     Comments: The patient was in some discomfort of range of motion at the left shoulder.  No obvious deformity or dislocation.  Good movement at the elbow wrist and hands.  Radial pulses 2+.  No tenderness to palpation over the cervical or thoracic spine area.  There is some tenderness to palpation left posterior thoracic back area.  Skin:    General: Skin is warm and dry.  Neurological:     General: No focal deficit present.     Mental Status: He is alert and oriented to person, place, and time.     Cranial Nerves: No cranial nerve deficit.     Sensory: No sensory deficit.     Motor: No weakness.      ED Treatments / Results  Labs (all labs ordered are listed, but only abnormal results are displayed) Labs Reviewed - No data to display  EKG None  Radiology Dg Ribs Unilateral W/chest Left  Result Date: 08/19/2019 CLINICAL DATA:  Pt c/o Lt rib pain, Lt, shoulder pain, and Lt upper back pain since falling Sunday. Hx nonsmoker. EXAM: LEFT RIBS AND CHEST - 3+ VIEW COMPARISON:  Chest radiographs, 09/08/2018. FINDINGS: No rib fracture or rib lesion. Cardiac  silhouette is normal in size. No mediastinal or hilar masses. Lungs show prominent bronchovascular markings but are otherwise clear. No pleural effusion or pneumothorax. IMPRESSION: 1. No rib fracture or rib lesion. 2. No acute cardiopulmonary disease. Electronically Signed   By: Lajean Manes M.D.   On: 08/19/2019 09:00   Dg Thoracic Spine 2 View  Result Date: 08/19/2019 CLINICAL DATA:  Pt c/o Lt rib pain, Lt, shoulder pain, and Lt upper back pain since falling Sunday. Hx nonsmoker. EXAM: THORACIC SPINE 2 VIEWS COMPARISON:  07/04/2015 FINDINGS: No fracture, spondylolisthesis or bone lesion. Mild disc degenerative changes noted along the mid to lower thoracic spine. Soft tissues are unremarkable. IMPRESSION: No fracture or acute finding.  Electronically Signed   By: Amie Portland M.D.   On: 08/19/2019 09:01   Dg Shoulder Left  Result Date: 08/19/2019 CLINICAL DATA:  Pt c/o Lt rib pain, Lt, shoulder pain, and Lt upper back pain since falling Sunday. Hx nonsmoker. EXAM: LEFT SHOULDER - 2+ VIEW COMPARISON:  None. FINDINGS: There is no evidence of fracture or dislocation. There is no evidence of arthropathy or other focal bone abnormality. Soft tissues are unremarkable. IMPRESSION: Negative. Electronically Signed   By: Amie Portland M.D.   On: 08/19/2019 08:59    Procedures Procedures (including critical care time)  Medications Ordered in ED Medications  HYDROmorphone (DILAUDID) injection 2 mg (2 mg Intramuscular Given 08/19/19 0752)     Initial Impression / Assessment and Plan / ED Course  I have reviewed the triage vital signs and the nursing notes.  Pertinent labs & imaging results that were available during my care of the patient were reviewed by me and considered in my medical decision making (see chart for details).        Patient x-rays of left shoulder left thoracic spine chest x-ray with concentration of left ribs without any acute bony abnormalities.  Patient is not tachycardic not  hypoxic.  Symptoms in the shoulder and the upper part of the back could perhaps be radicular in nature.  But patient without any significant neck pain at this point in time.  In addition no evidence of any rib injury.  Patient received IM hydromorphone here with improvement.  Patient not a candidate for outpatient narcotics.  Based on database review he gets them frequently and just got some the end of November.  Refer patient back to primary care for the pain management components.  May need additional evaluation.  Patient in no acute distress.  Patient stable for discharge home.  Final Clinical Impressions(s) / ED Diagnoses   Final diagnoses:  Fall, initial encounter  Acute pain of left shoulder  Acute left-sided thoracic back pain    ED Discharge Orders    None       Vanetta Mulders, MD 08/19/19 [phone]

## 2019-08-19 NOTE — ED Triage Notes (Signed)
Pt reports he slipped on his deck Sunday and fell.  C/O pain in left shoulder and upper back.

## 2019-08-19 NOTE — Discharge Instructions (Addendum)
Follow-up with your primary care doctor or pain management for additional pain control.  Today's x-rays of your chest with concentration of the left-sided ribs and left shoulder and thoracic spine without any acute bony injuries.  Implying that this is more muscular in nature.

## 2019-08-23 ENCOUNTER — Encounter (HOSPITAL_COMMUNITY): Payer: Self-pay | Admitting: Emergency Medicine

## 2019-08-23 ENCOUNTER — Other Ambulatory Visit: Payer: Self-pay

## 2019-08-23 ENCOUNTER — Emergency Department (HOSPITAL_COMMUNITY)
Admission: EM | Admit: 2019-08-23 | Discharge: 2019-08-23 | Disposition: A | Discharge status: Home / Self Care | Payer: Medicare Other | Attending: Emergency Medicine | Admitting: Emergency Medicine

## 2019-08-23 DIAGNOSIS — H5340 Unspecified visual field defects: Secondary | ICD-10-CM | POA: Diagnosis not present

## 2019-08-23 DIAGNOSIS — F17210 Nicotine dependence, cigarettes, uncomplicated: Secondary | ICD-10-CM | POA: Diagnosis not present

## 2019-08-23 MED ORDER — FLUORESCEIN SODIUM 1 MG OP STRP
1.0000 | ORAL_STRIP | Freq: Once | OPHTHALMIC | Status: DC
  Filled 2019-08-23: qty 1

## 2019-08-23 MED ORDER — TETRACAINE HCL 0.5 % OP SOLN
2.0000 [drp] | Freq: Once | OPHTHALMIC | Status: DC
  Filled 2019-08-23: qty 4

## 2019-08-23 MED ORDER — OXYCODONE-ACETAMINOPHEN 5-325 MG PO TABS
1.0000 | ORAL_TABLET | Freq: Once | ORAL | Status: AC
  Administered 2019-08-23: 1 via ORAL
  Filled 2019-08-23: qty 1

## 2019-08-23 NOTE — Discharge Instructions (Signed)
Please go to the ophthalmologist office tomorrow at 8:30 AM for further evaluation and treatment of your vision changes.  It is important that you go to this appointment so that they can determine what is going on with your eye.  Please return to the emergency department if you develop any new or worsening symptoms.

## 2019-08-23 NOTE — ED Triage Notes (Signed)
Patient c/o vision change and pain in left eye. Per patient woke yesterday morning with headache, took some tylenol and went back to sleep. Patient reports waking with pain and extreme blurred vision in left eye only. Per patient still has headache. Denies any known injury. Denies headache, blurred speech, weakness, or facial drooping.

## 2019-08-23 NOTE — ED Provider Notes (Addendum)
[facility] EMERGENCY DEPARTMENT Provider Note   CSN: [id number] Arrival date & time: 08/23/19  1355     History   Chief Complaint Chief Complaint  Patient presents with  . Visual Field Change    HPI [name] is a 48 y.o. male with history of chronic back pain who presents with a 2-day history of left eye vision change, mostly peripheral and blurry.  Patient has had associated headache that gets worse when he rubs his eye.  Denies any injury or known foreign object that hit his eye.  Denies any weakness, slurred speech, numbness.  No interventions taken prior to arrival.     HPI  Past Medical History:  Diagnosis Date  . Arthritis   . Chronic back pain   . History of degenerative disc disease   . History of spinal fusion     Patient Active Problem List   Diagnosis Date Noted  . Abdominal aortic aneurysm (AAA) without rupture ([facility]) 03/11/2019  . Calculus of gallbladder without cholecystitis without obstruction   . Closed displaced fracture of fourth metacarpal bone of right hand, initial encounter  07/29/17 08/29/2017  . Radicular pain in right arm 04/22/2016  . Hyperlipidemia 10/16/2013  . Allergic rhinitis 01/09/2013  . Chronic low back pain 12/17/2012    Past Surgical History:  Procedure Laterality Date  . AMPUTATION Left 04/28/2014   Procedure: 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;  Surgeon: Marcheta Grammes, DPM;  Location: [facility];  Service: Podiatry;  Laterality: Left;  . CHOLECYSTECTOMY N/A 06/13/2018   Procedure: LAPAROSCOPIC CHOLECYSTECTOMY;  Surgeon: Aviva Signs, MD;  Location: [facility];  Service: General;  Laterality: N/A;  . SPINAL FUSION          Home Medications    Prior to Admission medications   Medication Sig Start Date End Date Taking? Authorizing Provider  albuterol (PROVENTIL HFA;VENTOLIN HFA) 108 (90 Base) MCG/ACT inhaler Inhale 2 puffs into the lungs every 6 (six) hours as needed for wheezing. Patient not taking: Reported on 05/27/2019  09/12/18   Kathyrn Drown, MD  cholestyramine Lucrezia Starch) 4 GM/DOSE powder 1 scoop Twice daily with meals mix with water or juice prn diarrhea 05/27/19   Kathyrn Drown, MD  clindamycin (CLEOCIN) 150 MG capsule 3 tabs PO TID x 10 days 05/29/19   Francine Graven, DO  penicillin v potassium (VEETID) 500 MG tablet Take one tablet po 4 times a day for 7 days 06/11/19   Kathyrn Drown, MD  dicyclomine (BENTYL) 20 MG tablet Take 1 tablet (20 mg total) by mouth 2 (two) times daily. Patient not taking: Reported on 10/07/2018 06/05/18 04/03/19  Aviva Signs, MD    Family History No family history on file.  Social History Social History   Tobacco Use  . Smoking status: Current Every Day Smoker    Packs/day: 1.00    Types: Cigarettes  . Smokeless tobacco: Never Used  Substance Use Topics  . Alcohol use: No  . Drug use: No     Allergies   Toradol [ketorolac tromethamine]   Review of Systems Review of Systems  Constitutional: Negative for chills and fever.  HENT: Negative for facial swelling and sore throat.   Eyes: Positive for photophobia, pain and visual disturbance. Negative for discharge, redness and itching.  Respiratory: Negative for shortness of breath.   Cardiovascular: Negative for chest pain.  Gastrointestinal: Negative for abdominal pain, nausea and vomiting.  Genitourinary: Negative for dysuria.  Musculoskeletal: Negative for back pain.  Skin: Negative  for rash and wound.  Neurological: Positive for headaches. Negative for weakness and numbness.  Psychiatric/Behavioral: The patient is not nervous/anxious.      Physical Exam Updated Vital Signs BP 130/85 (BP Location: Right Arm)   Pulse 89   Temp 98.8 F (37.1 C) (Oral)   Resp 18   Ht 6\' 1"  (1.854 m)   Wt 90.7 kg   SpO2 92%   BMI 26.39 kg/m   Physical Exam Vitals signs and nursing note reviewed.  Constitutional:      General: He is not in acute distress.    Appearance: He is well-developed. He is not  diaphoretic.  HENT:     Head: Normocephalic and atraumatic.     Mouth/Throat:     Pharynx: No oropharyngeal exudate.  Eyes:     General: Lids are normal. Lids are everted, no foreign bodies appreciated. Vision grossly intact. No scleral icterus.       Right eye: No discharge.        Left eye: No discharge.     Intraocular pressure: Right eye pressure is 15 mmHg. Left eye pressure is 20 mmHg. Measurements were taken using a handheld tonometer.    Extraocular Movements: Extraocular movements intact.     Conjunctiva/sclera: Conjunctivae normal.     Right eye: Right conjunctiva is not injected.     Left eye: Left conjunctiva is not injected.     Pupils: Pupils are equal, round, and reactive to light. Pupils are equal.     Comments: Left upper visual field patient cannot see; he describes it is not black and not necessarily blurry, but cannot see my fingers; no consensual photophobia; unable to visualize anything significant on fundoscopic exam   Visual Acuity  Right Eye Distance: 20/25 Left Eye Distance: 20/70 Bilateral Distance: 20/30     Neck:     Musculoskeletal: Normal range of motion and neck supple.     Thyroid: No thyromegaly.  Cardiovascular:     Rate and Rhythm: Normal rate and regular rhythm.     Heart sounds: Normal heart sounds. No murmur. No friction rub. No gallop.   Pulmonary:     Effort: Pulmonary effort is normal. No respiratory distress.     Breath sounds: Normal breath sounds. No stridor. No wheezing or rales.  Abdominal:     General: Bowel sounds are normal. There is no distension.     Palpations: Abdomen is soft.     Tenderness: There is no abdominal tenderness. There is no guarding or rebound.  Lymphadenopathy:     Cervical: No cervical adenopathy.  Skin:    General: Skin is warm and dry.     Coloration: Skin is not pale.     Findings: No rash.  Neurological:     Mental Status: He is alert.     Coordination: Coordination normal.     Comments: CN 3-12  intact; normal sensation throughout; 5/5 strength in all 4 extremities; equal bilateral grip strength      ED Treatments / Results  Labs (all labs ordered are listed, but only abnormal results are displayed) Labs Reviewed - No data to display  EKG None  Radiology No results found.  Procedures Procedures (including critical care time)  Medications Ordered in ED Medications  fluorescein ophthalmic strip 1 strip (1 strip Left Eye Handoff 08/23/19 1502)  tetracaine (PONTOCAINE) 0.5 % ophthalmic solution 2 drop (2 drops Left Eye Handoff 08/23/19 1502)  oxyCODONE-acetaminophen (PERCOCET/ROXICET) 5-325 MG per tablet 1 tablet (1 tablet Oral  Given 08/23/19 1632)     Initial Impression / Assessment and Plan / ED Course  I have reviewed the triage vital signs and the nursing notes.  Pertinent labs & imaging results that were available during my care of the patient were reviewed by me and considered in my medical decision making (see chart for details).        Patient presenting with left eye blurry vision and peripheral field defect.  Patient has associated photophobia.  There is no uptake on fluorescein staining.  Pressures are within normal limits.  No significant pain with EOMs.  No consensual photophobia.  Otherwise, normal neuro exam without focal deficits.  I discussed patient case with ophthalmologist, Dr. Charlotte Sanes who advised this could be a retinal detachment or vascular occlusion.  There is low suspicion for optic neuritis, per ophthalmology.  She advised for patient to go to her office at 830 tomorrow morning.  I appreciate her assistance with the patient.  Patient understands and agrees with plan.    Final Clinical Impressions(s) / ED Diagnoses   Final diagnoses:  Visual field defect    ED Discharge Orders    None           Emi Holes, PA-C 08/23/19 1716    Bethann Berkshire, MD 08/28/19 [phone]

## 2019-08-24 ENCOUNTER — Telehealth: Payer: Self-pay | Admitting: Family Medicine

## 2019-08-24 NOTE — Telephone Encounter (Signed)
Patient is requesting refill on pain medication for eyeball pain was seen in ER on 11/9,12/2 and 12/6.[place] please advise

## 2019-08-24 NOTE — Telephone Encounter (Signed)
Patient advised that Dr Nicki Reaper could not prescribe patient any pain medication. Patient verbalized understanding and declined referral to pain management and stated he would go back to ER to get some pain medication.

## 2019-08-24 NOTE — Telephone Encounter (Signed)
I have talked with this patient numerous times how we will not be able to prescribe pain medicines  I have offered him referral to pain management If you would like to have another referral to pain management That would be [facility] or other pain management center in [place]

## 2019-08-25 DIAGNOSIS — R519 Headache, unspecified: Secondary | ICD-10-CM | POA: Diagnosis not present

## 2019-08-25 DIAGNOSIS — H5712 Ocular pain, left eye: Secondary | ICD-10-CM | POA: Diagnosis not present

## 2019-09-13 ENCOUNTER — Emergency Department (HOSPITAL_COMMUNITY): Payer: Medicare Other

## 2019-09-13 ENCOUNTER — Other Ambulatory Visit: Payer: Self-pay

## 2019-09-13 ENCOUNTER — Emergency Department (HOSPITAL_COMMUNITY)
Admission: EM | Admit: 2019-09-13 | Discharge: 2019-09-13 | Disposition: A | Discharge status: Home / Self Care | Payer: Medicare Other | Attending: Emergency Medicine | Admitting: Emergency Medicine

## 2019-09-13 ENCOUNTER — Encounter (HOSPITAL_COMMUNITY): Payer: Self-pay

## 2019-09-13 DIAGNOSIS — M25512 Pain in left shoulder: Secondary | ICD-10-CM | POA: Insufficient documentation

## 2019-09-13 DIAGNOSIS — Z79899 Other long term (current) drug therapy: Secondary | ICD-10-CM | POA: Diagnosis not present

## 2019-09-13 DIAGNOSIS — F17210 Nicotine dependence, cigarettes, uncomplicated: Secondary | ICD-10-CM | POA: Insufficient documentation

## 2019-09-13 MED ORDER — LIDOCAINE 5 % EX PTCH
2.0000 | MEDICATED_PATCH | CUTANEOUS | Status: DC
  Filled 2019-09-13: qty 2

## 2019-09-13 MED ORDER — METHOCARBAMOL 500 MG PO TABS: 500.0000 mg | ORAL_TABLET | Freq: Two times a day (BID) | ORAL | 0 refills | Status: DC

## 2019-09-13 MED ORDER — NAPROXEN 500 MG PO TABS: 500.0000 mg | ORAL_TABLET | Freq: Two times a day (BID) | ORAL | 0 refills | Status: DC

## 2019-09-13 MED ORDER — MELOXICAM 7.5 MG PO TABS: 7.5000 mg | ORAL_TABLET | Freq: Every day | ORAL | 0 refills | Status: DC

## 2019-09-13 NOTE — Discharge Instructions (Addendum)
As discussed, your x-ray was negative for any broken bones or dislocations. Your pain is likely due to a muscle strain. I am sending you home with a prescription for Naproxen which is a stronger ibuprofen. Do not mix with other over the counter medications. I am also sending you home with Robaxin which is a muscle relaxer. Medication can cause drowsiness so do not drive or operate machinery while on the medication. You may also use over the counter lidoderm patches and Voltaren gel as needed for pain. I have included a paper on ice therapy. Follow-up with your PCP if you symptoms do not improve within the next week. Return to the ER for new or worsening symptoms.

## 2019-09-13 NOTE — ED Provider Notes (Signed)
[facility] EMERGENCY DEPARTMENT Provider Note   CSN: [id number] Arrival date & time: 09/13/19  1353     History Chief Complaint  Patient presents with  . Shoulder Pain    [name] is a 48 y.o. male with a past medical history significant for arthritis, chronic back pain, and abdominal aortic aneurysm without rupture who presents to the ED due to sudden onset of posterior left shoulder pain x2 days.  Patient notes he was lifting a heavy box over his head when he dropped it and felt immediate left shoulder pain.  Patient states he has had left shoulder pain in the past which feels similar to this.  Chart reviewed.  Patient was seen on 08/19/2019 with similar left shoulder pain.  Patient has tried Tylenol and heating pads with moderate relief.  He states left shoulder pain is worse with movement especially overhead movement.  He admits to chronic numbness and tingling with his left arm into fingers 3, 4 and 5, but states this is not a recent issue.  Patient denies fever and chills.  Patient denies erythema, warmth, and edema of the left shoulder.  Patient states pain is so severe sometimes it causes shortness of breath.  Patient denies chest pain and neck pain.     Past Medical History:  Diagnosis Date  . Arthritis   . Chronic back pain   . History of degenerative disc disease   . History of spinal fusion     Patient Active Problem List   Diagnosis Date Noted  . Abdominal aortic aneurysm (AAA) without rupture ([facility]) 03/11/2019  . Calculus of gallbladder without cholecystitis without obstruction   . Closed displaced fracture of fourth metacarpal bone of right hand, initial encounter  07/29/17 08/29/2017  . Radicular pain in right arm 04/22/2016  . Hyperlipidemia 10/16/2013  . Allergic rhinitis 01/09/2013  . Chronic low back pain 12/17/2012    Past Surgical History:  Procedure Laterality Date  . 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 CHOLECYSTECTOMY N/A 06/13/2018   Procedure: LAPAROSCOPIC CHOLECYSTECTOMY;  Surgeon: Aviva Signs, MD;  Location: [facility];  Service: General;  Laterality: N/A;  . SPINAL FUSION         No family history on file.  Social History   Tobacco Use  . Smoking status: Current Every Day Smoker    Packs/day: 1.00    Types: Cigarettes  . Smokeless tobacco: Never Used  Substance Use Topics  . Alcohol use: No  . Drug use: No    Home Medications Prior to Admission medications   Medication Sig Start Date End Date Taking? Authorizing Provider  albuterol (PROVENTIL HFA;VENTOLIN HFA) 108 (90 Base) MCG/ACT inhaler Inhale 2 puffs into the lungs every 6 (six) hours as needed for wheezing. Patient not taking: Reported on 05/27/2019 09/12/18   Kathyrn Drown, MD  cholestyramine Lucrezia Starch) 4 GM/DOSE powder 1 scoop Twice daily with meals mix with water or juice prn diarrhea 05/27/19   Kathyrn Drown, MD  clindamycin (CLEOCIN) 150 MG capsule 3 tabs PO TID x 10 days 05/29/19   Francine Graven, DO  methocarbamol (ROBAXIN) 500 MG tablet Take 1 tablet (500 mg total) by mouth 2 (two) times daily. 09/13/19   Suzy Bouchard, PA-C  naproxen (NAPROSYN) 500 MG tablet Take 1 tablet (500 mg total) by mouth 2 (two) times daily. 09/13/19  Claudette StaplerAberman, Alem Fahl C, PA-C  penicillin v potassium (VEETID) 500 MG tablet Take one tablet po 4 times a day for 7 days 06/11/19   Babs SciaraLuking, Scott A, MD  dicyclomine (BENTYL) 20 MG tablet Take 1 tablet (20 mg total) by mouth 2 (two) times daily. Patient not taking: Reported on 10/07/2018 06/05/18 04/03/19  Franky MachoJenkins, Mark, MD    Allergies    Lyrica [pregabalin] and Toradol [ketorolac tromethamine]  Review of Systems   Review of Systems  Constitutional: Negative for chills and fever.  Respiratory: Positive for shortness of breath (due to severe pain).   Cardiovascular: Negative for chest pain.   Musculoskeletal: Positive for arthralgias and myalgias. Negative for gait problem, joint swelling and neck pain.  Skin: Negative for color change.  Neurological: Positive for numbness (chronic left arm numbness/tingling).    Physical Exam Updated Vital Signs BP (!) 130/100 (BP Location: Right Arm)   Pulse 71   Temp 98 F (36.7 C) (Oral)   Resp 18   Ht 6\' 1"  (1.854 m)   Wt 90.7 kg   SpO2 96%   BMI 26.39 kg/m   Physical Exam Vitals and nursing note reviewed.  Constitutional:      General: He is not in acute distress.    Appearance: He is not ill-appearing.  HENT:     Head: Normocephalic.  Eyes:     Conjunctiva/sclera: Conjunctivae normal.  Cardiovascular:     Rate and Rhythm: Normal rate and regular rhythm.     Pulses: Normal pulses.     Heart sounds: Normal heart sounds. No murmur. No friction rub. No gallop.   Pulmonary:     Effort: Pulmonary effort is normal.     Breath sounds: Normal breath sounds.  Abdominal:     General: Abdomen is flat. There is no distension.     Palpations: Abdomen is soft.     Tenderness: There is no abdominal tenderness. There is no guarding or rebound.  Musculoskeletal:     Cervical back: Neck supple.     Right lower leg: No edema.     Left lower leg: No edema.     Comments: Reproducible tenderness to palpation over posterior aspect of left shoulder. No erythema, warmth, or edema. No deformity or crepitus. Limited overhead ROM due to pain. Radial pulse intact. Sensation of bilateral upper extremities intact. No cervical, thoracic, or lumbar midline tenderness. Mild tenderness over left thoracic paraspinal region. Full ROM of elbow and wrist. No tenderness over elbow or wrist.  Skin:    General: Skin is warm.  Neurological:     General: No focal deficit present.     Mental Status: He is alert.     ED Results / Procedures / Treatments   Labs (all labs ordered are listed, but only abnormal results are displayed) Labs Reviewed - No data to  display  EKG None  Radiology DG Shoulder Left  Result Date: 09/13/2019 CLINICAL DATA:  Left shoulder ring for 2 days after lifting a heavy box. EXAM: LEFT SHOULDER - 2+ VIEW COMPARISON:  August 19, 2019 FINDINGS: There is no evidence of fracture or dislocation. There is no evidence of arthropathy or other focal bone abnormality. Soft tissues are unremarkable. IMPRESSION: Negative. Electronically Signed   By: Sherian ReinWei-Chen  Lin M.D.   On: 09/13/2019 14:24    Procedures Procedures (including critical care time)  Medications Ordered in ED Medications  lidocaine (LIDODERM) 5 % 2 patch (has no administration in time range)    ED Course  I have reviewed the triage vital signs and the nursing notes.  Pertinent labs & imaging results that were available during my care of the patient were reviewed by me and considered in my medical decision making (see chart for details).    MDM Rules/Calculators/A&P                     48 year old male presents to the ED for an evaluation of left shoulder pain after lifting a heavy box 2 days ago.  Chart reviewed and patient has been seen in the ED for similar complaint earlier this month. Stable vitals. Patient in no acute distress and non-ill appearing. Tenderness to palpation over posterior aspect of left shoulder with no edema, warmth, or erythema. Doubt septic arthritis at this time. Limited overhead ROM due to pain. Neurovascularly intact. X-ray personally reviewed which is negative for bony fractures and dislocation. Suspect left shoulder pain related to muscle strain. No neck pain at this time, so doubt radicular nature of pain. Will treat conservatively with Naproxen, Lidoderm patches, Robaxin, and Voltaren gel. Patient states he has taken Naproxen before with no allergic reaction. Patient advised that Robaxin can cause drowsiness and to not drive or operate machinery while on the medication. Patient given list of PCPs in the area. Advised to follow-up with  PCP if symptoms do not improve within the next week. Strict ED precautions discussed with patient. Patient states understanding and agrees to plan. Patient discharged home in no acute distress and stable vitals  Final Clinical Impression(s) / ED Diagnoses Final diagnoses:  Acute pain of left shoulder    Rx / DC Orders ED Discharge Orders         Ordered    methocarbamol (ROBAXIN) 500 MG tablet  2 times daily     09/13/19 1644    meloxicam (MOBIC) 7.5 MG tablet  Daily,   Status:  Discontinued     09/13/19 1644    naproxen (NAPROSYN) 500 MG tablet  2 times daily     09/13/19 [address] 09/13/19 1656    Bethann Berkshire, MD 09/13/19 2216

## 2019-09-13 NOTE — ED Triage Notes (Signed)
Pt presents to ED with complaints of left shoulder pain x 2 days. Pt states his shoulder started hurting after he was lifting a big box and went to lift it above his head, it was too heavy and went to drop it and then started with the pain in the left shoulder.

## 2019-09-18 ENCOUNTER — Emergency Department (HOSPITAL_COMMUNITY)
Admission: EM | Admit: 2019-09-18 | Discharge: 2019-09-18 | Disposition: A | Discharge status: Home / Self Care | Payer: Medicare HMO | Attending: Emergency Medicine | Admitting: Emergency Medicine

## 2019-09-18 ENCOUNTER — Other Ambulatory Visit: Payer: Self-pay

## 2019-09-18 ENCOUNTER — Encounter (HOSPITAL_COMMUNITY): Payer: Self-pay | Admitting: *Deleted

## 2019-09-18 DIAGNOSIS — F17210 Nicotine dependence, cigarettes, uncomplicated: Secondary | ICD-10-CM | POA: Diagnosis not present

## 2019-09-18 DIAGNOSIS — M25512 Pain in left shoulder: Secondary | ICD-10-CM | POA: Insufficient documentation

## 2019-09-18 MED ORDER — CYCLOBENZAPRINE HCL 10 MG PO TABS: 10.0000 mg | ORAL_TABLET | Freq: Two times a day (BID) | ORAL | 0 refills | Status: DC | PRN

## 2019-09-18 MED ORDER — DIAZEPAM 5 MG PO TABS
5.0000 mg | ORAL_TABLET | Freq: Once | ORAL | Status: AC
  Administered 2019-09-18: 17:00:00 5 mg via ORAL
  Filled 2019-09-18: qty 1

## 2019-09-18 NOTE — Discharge Instructions (Signed)
You were seen in the emergency department today for continued left shoulder pain.  This may be a rotator cuff issue or may be a specific muscle issue.  We are sending home with exercises to try to help improve your shoulder mobility and discomfort.  Please stop taking Robaxin and start taking Flexeril.  - Flexeril is the muscle relaxer I have prescribed, this is meant to help with muscle tightness. Be aware that this medication may make you drowsy therefore the first time you take this it should be at a time you are in an environment where you can rest. Do not drive or operate heavy machinery when taking this medication. Do not drink alcohol or take other sedating medications with this medicine such as narcotics or benzodiazepines.   You make take Tylenol and or ibuprofen per over the counter dosing with these medications.   We have prescribed you new medication(s) today. Discuss the medications prescribed today with your pharmacist as they can have adverse effects and interactions with your other medicines including over the counter and prescribed medications. Seek medical evaluation if you start to experience new or abnormal symptoms after taking one of these medicines, seek care immediately if you start to experience difficulty breathing, feeling of your throat closing, facial swelling, or rash as these could be indications of a more serious allergic reaction  Try applying heat to the area to see if this helps.  Please follow-up with Dr. Romeo Apple, the orthopedic surgeon, provided in your discharge instructions within 3 days for reevaluation.  Return to the ER for new or worsening symptoms or any other concerns.

## 2019-09-18 NOTE — ED Provider Notes (Signed)
[facility] EMERGENCY DEPARTMENT Provider Note   CSN: [id number] Arrival date & time: 09/18/19  1557     History Chief Complaint  Patient presents with  . Shoulder Pain    [name] is a 49 y.o. male with a history of chronic back pain, prior spinal fusion, arthritis, hyperlipidemia, & AAA without rupture who presents to the ED with complaints of continued left shoulder pain for about 1 week. Patient states he was doing a lot of lifting of items and placing them onto shelves when he developed L shoulder pain. Discomfort is more to the posterior shoulder, constant, worse with movement, no significant alleviating factors. Given robaxin & naproxen @ last visit which he has tried without relief. Also has tried lidocaine patches without much change. When he has had pain like this in the past narcotics & valium has helped. Denies direct trauma/falls. Denies chest pain/dyspnea. Denies numbness, tingling, weakness, saddle anesthesia, incontinence to bowel/bladder, fever, chills, IV drug use, dysuria, or hx of cancer. Denies prior neck or shoulder surgeries.  [name]  HPI     Past Medical History:  Diagnosis Date  . Arthritis   . Chronic back pain   . History of degenerative disc disease   . History of spinal fusion     Patient Active Problem List   Diagnosis Date Noted  . Abdominal aortic aneurysm (AAA) without rupture ([facility]) 03/11/2019  . Calculus of gallbladder without cholecystitis without obstruction   . Closed displaced fracture of fourth metacarpal bone of right hand, initial encounter  07/29/17 08/29/2017  . Radicular pain in right arm 04/22/2016  . Hyperlipidemia 10/16/2013  . Allergic rhinitis 01/09/2013  . Chronic low back pain 12/17/2012    Past Surgical History:  Procedure Laterality Date  . 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  CHOLECYSTECTOMY N/A 06/13/2018   Procedure: LAPAROSCOPIC CHOLECYSTECTOMY;  Surgeon: Franky Macho, MD;  Location: [facility];  Service: General;  Laterality: N/A;  . SPINAL FUSION         History reviewed. No pertinent family history.  Social History   Tobacco Use  . Smoking status: Current Every Day Smoker    Packs/day: 1.00    Types: Cigarettes  . Smokeless tobacco: Never Used  Substance Use Topics  . Alcohol use: No  . Drug use: No    Home Medications Prior to Admission medications   Medication Sig Start Date End Date Taking? Authorizing Provider  albuterol (PROVENTIL HFA;VENTOLIN HFA) 108 (90 Base) MCG/ACT inhaler Inhale 2 puffs into the lungs every 6 (six) hours as needed for wheezing. Patient not taking: Reported on 05/27/2019 09/12/18   Babs Sciara, MD  cholestyramine Lanetta Inch) 4 GM/DOSE powder 1 scoop Twice daily with meals mix with water or juice prn diarrhea 05/27/19   Babs Sciara, MD  clindamycin (CLEOCIN) 150 MG capsule 3 tabs PO TID x 10 days 05/29/19   Samuel Jester, DO  methocarbamol (ROBAXIN) 500 MG tablet Take 1 tablet (500 mg total) by mouth 2 (two) times daily. 09/13/19   Mannie Stabile, PA-C  naproxen (NAPROSYN) 500 MG tablet Take 1 tablet (500 mg total) by mouth 2 (two) times daily. 09/13/19   Mannie Stabile, PA-C  penicillin v potassium (VEETID) 500 MG tablet Take one tablet po 4 times a day for 7 days 06/11/19   Babs Sciara, MD  dicyclomine (BENTYL)  20 MG tablet Take 1 tablet (20 mg total) by mouth 2 (two) times daily. Patient not taking: Reported on 10/07/2018 06/05/18 04/03/19  Franky Macho, MD    Allergies    Lyrica [pregabalin] and Toradol [ketorolac tromethamine]  Review of Systems   Review of Systems  Constitutional: Negative for chills, fever and unexpected weight change.  Respiratory: Negative for shortness of breath.   Cardiovascular: Negative for chest pain.  Gastrointestinal: Negative for abdominal pain, nausea and vomiting.    Genitourinary: Negative for dysuria.  Musculoskeletal: Positive for arthralgias.  Neurological: Negative for weakness and numbness.       Negative for saddle anesthesia or bowel/bladder incontinence.     Physical Exam Updated Vital Signs BP 118/88 (BP Location: Right Arm)   Pulse 91   Temp 98.3 F (36.8 C) (Oral)   Resp 16   SpO2 94%   Physical Exam Vitals and nursing note reviewed.  Constitutional:      General: He is not in acute distress.    Appearance: Normal appearance. He is not ill-appearing or toxic-appearing.  HENT:     Head: Normocephalic and atraumatic.  Neck:     Comments: No midline tenderness.  Cardiovascular:     Rate and Rhythm: Normal rate.     Pulses:          Radial pulses are 2+ on the right side and 2+ on the left side.  Pulmonary:     Effort: No respiratory distress.     Breath sounds: Normal breath sounds.  Musculoskeletal:     Cervical back: Normal range of motion and neck supple.     Comments: Upper extremities: No obvious deformity, appreciable swelling, edema, erythema, ecchymosis, warmth, or open wounds. Patient has intact AROM throughout with the exception of L shoulder flexion- able to get just past 90 degrees prior to onset of pain. Tender to palpation to the supraspinatus & subscapularis muscles of the L shoulder. No specific point/focal bony tenderness. Pain with empty can test of the LUE but no obvious strength deficit.   Skin:    General: Skin is warm and dry.     Capillary Refill: Capillary refill takes less than 2 seconds.  Neurological:     Mental Status: He is alert.     Comments: Alert. Clear speech. Sensation grossly intact to bilateral upper extremities. 5/5 symmetric grip strength. Ambulatory. Able to perform OK sign, thumbs up, and cross 2nd/3rd digits bilaterally.   Psychiatric:        Mood and Affect: Mood normal.        Behavior: Behavior normal.     ED Results / Procedures / Treatments   Labs (all labs ordered are  listed, but only abnormal results are displayed) Labs Reviewed - No data to display  EKG None  Radiology No results found.  Procedures Procedures (including critical care time)  Medications Ordered in ED Medications  diazepam (VALIUM) tablet 5 mg (has no administration in time range)    ED Course  I have reviewed the triage vital signs and the nursing notes.  Pertinent labs & imaging results that were available during my care of the patient were reviewed by me and considered in my medical decision making (see chart for details).    MDM Rules/Calculators/A&P                      Patient returns to the ED with complaints of continued L shoulder pain after heavy lifting.  Nontoxic appearing,  resting comfortably, vitals WNL.  No fever, erythema, or warmth, does not seem consistent w/ septic joint.  No rashes to indicate shingles.  Recent xray negative, no direct trauma or point/focal bony tenderness- doubt fracture/dislocation.  No chest pain/dyspnea, reproducible on palpation & with ROM- doubt acute cardiopulmonary process.  NVI distally. Likely muscular, potentially rotator cuff issue. Will give 1 time dose of valium in the ED, but avoid further controlled substances with prescriptions. [place] Controlled Substance reporting System reviewed. Trial flexeril- discussed no driving/operating heavy machinery. Provided rotator cuff type exercises/stretching. Orthopedics follow up.   I discussed results, treatment plan, need for follow-up, and return precautions with the patient. Provided opportunity for questions, patient confirmed understanding and is in agreement with plan.   Final Clinical Impression(s) / ED Diagnoses Final diagnoses:  Left shoulder pain, unspecified chronicity    Rx / DC Orders ED Discharge Orders         Ordered    cyclobenzaprine (FLEXERIL) 10 MG tablet  2 times daily PRN     09/18/19 1647           Lenyx Boody, Glynda Jaeger, PA-C 09/18/19  1650    Maudie Flakes, MD 09/18/19 1753

## 2019-09-18 NOTE — ED Triage Notes (Signed)
Chronic left shoulder pain 

## 2019-09-30 ENCOUNTER — Other Ambulatory Visit: Payer: Self-pay

## 2019-09-30 ENCOUNTER — Encounter: Payer: Self-pay | Admitting: Orthopedic Surgery

## 2019-09-30 ENCOUNTER — Ambulatory Visit (INDEPENDENT_AMBULATORY_CARE_PROVIDER_SITE_OTHER): Payer: Medicare HMO | Admitting: Orthopedic Surgery

## 2019-09-30 VITALS — BP 134/77 | HR 90 | Ht 73.0 in | Wt 214.0 lb

## 2019-09-30 DIAGNOSIS — M546 Pain in thoracic spine: Secondary | ICD-10-CM

## 2019-09-30 DIAGNOSIS — G8929 Other chronic pain: Secondary | ICD-10-CM

## 2019-09-30 NOTE — Progress Notes (Signed)
Chief Complaint  Patient presents with  . Shoulder Pain    left     49 year old male with chronic midthoracic back pain previously seen by Dr. Francesca Jewett for multiple injections comes in with a 1 month history of left-sided periscapular pain unrelieved by oral pain medication naproxen and muscle relaxer.  Review of Systems  Musculoskeletal: Positive for back pain, myalgias and neck pain.  Neurological: Positive for sensory change.  All other systems reviewed and are negative.  Well-developed well-nourished male seems to be in discomfort BP 134/77   Pulse 90   Ht 6\' 1"  (1.854 m)   Wt 214 lb (97.1 kg)   BMI 28.23 kg/m   Left side of his parascapular region is severely tender bony area is nontender right side is normal  Notes are received and copied from [facility] emergency room dated September 17, 2018 at that time he was seen for similar periscapular pain which she relates to lifting heavy items at work.  This was 1 of 2 visits that he had to the emergency room for similar symptoms  They put him on Flexeril at that time.  He reports no relief from the Flexeril  Medical decision making   My personal interpretation of this film There is an x-ray of his left shoulder which shows normal glenohumeral joint normal bony anatomy no degenerative changes  Encounter Diagnosis  Name Primary?  . Chronic thoracic spine pain Yes   Exacerbation of chronic pain thoracic spine Personal interpretation of images  Patient will need referral to neurosurgery I will coordinate this with his medical doctor

## 2019-09-30 NOTE — Patient Instructions (Signed)
We will get Dr Gerda Diss to order your MRI and referral to Neurosurgery

## 2019-10-07 ENCOUNTER — Telehealth: Payer: Self-pay | Admitting: Family Medicine

## 2019-10-07 MED ORDER — PENICILLIN V POTASSIUM 500 MG PO TABS: 500.0000 mg | ORAL_TABLET | Freq: Four times a day (QID) | ORAL | 0 refills | Status: DC

## 2019-10-07 NOTE — Telephone Encounter (Signed)
Pt would like antibiotic for toothache. Dr. Lorin Picket has been removed as PCP per patient at ER visit on 12/27.

## 2019-10-07 NOTE — Telephone Encounter (Signed)
Please put myself back as his primary care provider Penicillin VK 500 mg, 1 taken 4 times daily for the next 7 days

## 2019-10-07 NOTE — Telephone Encounter (Signed)
Er note states he told them he didn't have a PCP and they gave him a list of PCPs in area but he would like you to stay PCP and he doesn't know why they did that

## 2019-10-07 NOTE — Telephone Encounter (Signed)
Prescription sent electronically to pharmacy. Patient notified. 

## 2019-10-07 NOTE — Telephone Encounter (Signed)
Clarify with the patient if he is declaring to the ER that he has no PCP and he will need to go to urgent care  If we are still his primary care doctor I can prescribe an antibiotic for his tooth but we will not be prescribing pain meds

## 2019-10-18 ENCOUNTER — Emergency Department (HOSPITAL_COMMUNITY)
Admission: EM | Admit: 2019-10-18 | Discharge: 2019-10-18 | Disposition: A | Discharge status: Home / Self Care | Payer: Medicare HMO | Attending: Emergency Medicine | Admitting: Emergency Medicine

## 2019-10-18 ENCOUNTER — Other Ambulatory Visit: Payer: Self-pay

## 2019-10-18 ENCOUNTER — Encounter (HOSPITAL_COMMUNITY): Payer: Self-pay | Admitting: Emergency Medicine

## 2019-10-18 DIAGNOSIS — M5431 Sciatica, right side: Secondary | ICD-10-CM

## 2019-10-18 DIAGNOSIS — F17210 Nicotine dependence, cigarettes, uncomplicated: Secondary | ICD-10-CM | POA: Insufficient documentation

## 2019-10-18 DIAGNOSIS — Z79899 Other long term (current) drug therapy: Secondary | ICD-10-CM | POA: Insufficient documentation

## 2019-10-18 DIAGNOSIS — M5441 Lumbago with sciatica, right side: Secondary | ICD-10-CM | POA: Diagnosis not present

## 2019-10-18 DIAGNOSIS — M5442 Lumbago with sciatica, left side: Secondary | ICD-10-CM | POA: Insufficient documentation

## 2019-10-18 DIAGNOSIS — M79605 Pain in left leg: Secondary | ICD-10-CM | POA: Diagnosis present

## 2019-10-18 MED ORDER — CELECOXIB 200 MG PO CAPS: 200.0000 mg | ORAL_CAPSULE | Freq: Two times a day (BID) | ORAL | 0 refills | Status: DC

## 2019-10-18 MED ORDER — METHYLPREDNISOLONE 4 MG PO TBPK: ORAL_TABLET | ORAL | 0 refills | Status: DC

## 2019-10-18 MED ORDER — OXYCODONE-ACETAMINOPHEN 5-325 MG PO TABS
2.0000 | ORAL_TABLET | Freq: Once | ORAL | Status: AC
  Administered 2019-10-18: 2 via ORAL
  Filled 2019-10-18: qty 2

## 2019-10-18 MED ORDER — BACLOFEN 10 MG PO TABS: 10.0000 mg | ORAL_TABLET | Freq: Three times a day (TID) | ORAL | 0 refills | Status: DC

## 2019-10-18 NOTE — ED Triage Notes (Signed)
Patient complains of bilateral leg pain that began yesterday. Patient has been taking tylenol, ibuprofen, and hydrocodone for the pain.

## 2019-10-18 NOTE — ED Notes (Signed)
Pt or both Dr Gerda Diss and Romeo Apple  Chronic pain issues  Here for pain to lowers legs since yesterday unrelieved by OTC and narcotics

## 2019-10-18 NOTE — ED Notes (Signed)
When giving ordered meds  Asked pt if he had more at home since he reported taking one for relief yesterday   When asked if he has spoken to Dr Romeo Apple about his back he replied, "he does bones"  Pt did not respond when asked if he has had pain clinic referrals

## 2019-10-18 NOTE — Discharge Instructions (Addendum)
SEEK IMMEDIATE MEDICAL ATTENTION IF: New numbness, tingling, weakness, or problem with the use of your arms or legs.  Severe back pain not relieved with medications.  Change in bowel or bladder control.  Increasing pain in any areas of the body (such as chest or abdominal pain).  Shortness of breath, dizziness or fainting.  Nausea (feeling sick to your stomach), vomiting, fever, or sweats.  

## 2019-10-18 NOTE — ED Provider Notes (Signed)
[facility] EMERGENCY DEPARTMENT Provider Note   CSN: [id number] Arrival date & time: 10/18/19  1320     History Chief Complaint  Patient presents with  . Leg Pain    [name] is a 49 y.o. male.  The history is provided by the patient and medical records. No language interpreter was used.  Back Pain Location:  Lumbar spine Quality:  Aching, shooting and stabbing Radiates to:  L posterior upper leg and R posterior upper leg Pain severity:  Severe Pain is:  Unable to specify Onset quality:  Gradual Duration:  1 day Timing:  Constant Progression:  Worsening Chronicity:  Recurrent Context: not emotional stress, not falling, not jumping from heights, not lifting heavy objects, not MCA, not MVA, not occupational injury, not pedestrian accident, not physical stress, not recent illness, not recent injury and not twisting   Relieved by:  Narcotics Worsened by:  Ambulation, bending, coughing, lying down, movement, sitting, twisting, touching, palpation and standing Ineffective treatments:  Cold packs, heating pad, ibuprofen, NSAIDs, lying down and OTC medications Associated symptoms: no abdominal pain, no abdominal swelling, no bladder incontinence, no bowel incontinence, no chest pain, no dysuria, no fever, no headaches, no leg pain, no numbness, no paresthesias, no pelvic pain, no perianal numbness, no tingling, no weakness and no weight loss   Risk factors: lack of exercise, obesity and vascular disease   Risk factors: no hx of cancer, no hx of osteoporosis, no menopause, not pregnant, no recent surgery and no steroid use        Past Medical History:  Diagnosis Date  . Arthritis   . Chronic back pain   . History of degenerative disc disease   . History of spinal fusion     Patient Active Problem List   Diagnosis Date Noted  . Abdominal aortic aneurysm (AAA) without rupture (HCC) 03/11/2019  . Calculus of gallbladder without cholecystitis without obstruction   .  Closed displaced fracture of fourth metacarpal bone of right hand, initial encounter  07/29/17 08/29/2017  . Radicular pain in right arm 04/22/2016  . Cervical radiculopathy 04/20/2016  . Cervical spondylosis 04/20/2016  . Hyperlipidemia 10/16/2013  . Allergic rhinitis 01/09/2013  . Chronic low back pain 12/17/2012    Past Surgical History:  Procedure Laterality Date  . 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 CHOLECYSTECTOMY N/A 06/13/2018   Procedure: LAPAROSCOPIC CHOLECYSTECTOMY;  Surgeon: Franky Macho, MD;  Location: [facility];  Service: General;  Laterality: N/A;  . SPINAL FUSION         Family History  Problem Relation Age of Onset  . Healthy Mother   . Aneurysm Father     Social History   Tobacco Use  . Smoking status: Current Every Day Smoker    Packs/day: 1.00    Types: Cigarettes  . Smokeless tobacco: Never Used  Substance Use Topics  . Alcohol use: No  . Drug use: No    Home Medications Prior to Admission medications   Medication Sig Start Date End Date Taking? Authorizing Provider  albuterol (PROVENTIL HFA;VENTOLIN HFA) 108 (90 Base) MCG/ACT inhaler Inhale 2 puffs into the lungs every 6 (six) hours as needed for wheezing. Patient not taking: Reported on 05/27/2019 09/12/18   Babs Sciara, MD  baclofen (LIORESAL) 10 MG tablet Take 1 tablet (10 mg total) by mouth 3 (three) times daily. 10/18/19  Arthor Captain, PA-C  celecoxib (CELEBREX) 200 MG capsule Take 1 capsule (200 mg total) by mouth 2 (two) times daily. 10/18/19   Arthor Captain, PA-C  cholestyramine Lanetta Inch) 4 GM/DOSE powder 1 scoop Twice daily with meals mix with water or juice prn diarrhea Patient not taking: Reported on 09/30/2019 05/27/19   Babs Sciara, MD  methylPREDNISolone (MEDROL DOSEPAK) 4 MG TBPK tablet Use as directed 10/18/19   Arthor Captain, PA-C  penicillin  v potassium (VEETID) 500 MG tablet Take 1 tablet (500 mg total) by mouth 4 (four) times daily. 10/07/19   Babs Sciara, MD  dicyclomine (BENTYL) 20 MG tablet Take 1 tablet (20 mg total) by mouth 2 (two) times daily. Patient not taking: Reported on 10/07/2018 06/05/18 04/03/19  Franky Macho, MD    Allergies    Lyrica [pregabalin] and Toradol [ketorolac tromethamine]  Review of Systems   Review of Systems  Constitutional: Negative for fever and weight loss.  Cardiovascular: Negative for chest pain.  Gastrointestinal: Negative for abdominal pain and bowel incontinence.  Genitourinary: Negative for bladder incontinence, dysuria and pelvic pain.  Musculoskeletal: Positive for back pain.  Neurological: Negative for tingling, weakness, numbness, headaches and paresthesias.    Physical Exam Updated Vital Signs BP 99/82 (BP Location: Right Arm)   Pulse 79   Temp 98.1 F (36.7 C) (Oral)   Resp 16   Ht 6\' 1"  (1.854 m)   Wt 97.1 kg   SpO2 95%   BMI 28.23 kg/m   Physical Exam Vitals and nursing note reviewed.  Constitutional:      General: He is not in acute distress.    Appearance: He is well-developed. He is not diaphoretic.     Comments: Appears uncomfortable  HENT:     Head: Normocephalic and atraumatic.  Eyes:     General: No scleral icterus.    Conjunctiva/sclera: Conjunctivae normal.  Cardiovascular:     Rate and Rhythm: Normal rate and regular rhythm.     Heart sounds: Normal heart sounds.  Pulmonary:     Effort: Pulmonary effort is normal. No respiratory distress.     Breath sounds: Normal breath sounds.  Abdominal:     Palpations: Abdomen is soft.     Tenderness: There is no abdominal tenderness.  Musculoskeletal:     Cervical back: Normal range of motion and neck supple.     Comments: Obese abdomen No midline tenderness BL paraspinal tenderness + straight leg test BL Normal strength and DTRs  Skin:    General: Skin is warm and dry.  Neurological:      General: No focal deficit present.     Mental Status: He is alert and oriented to person, place, and time.  Psychiatric:        Behavior: Behavior normal.     ED Results / Procedures / Treatments   Labs (all labs ordered are listed, but only abnormal results are displayed) Labs Reviewed - No data to display  EKG None  Radiology No results found.  Procedures Procedures (including critical care time)  Medications Ordered in ED Medications  oxyCODONE-acetaminophen (PERCOCET/ROXICET) 5-325 MG per tablet 2 tablet (has no administration in time range)    ED Course  I have reviewed the triage vital signs and the nursing notes.  Pertinent labs & imaging results that were available during my care of the patient were reviewed by me and considered in my medical decision making (see chart for details).    MDM Rules/Calculators/A&P  Patient with back pain.  No neurological deficits and normal neuro exam.  Patient can walk but states is painful.  No loss of bowel or bladder control.  No concern for cauda equina.  No fever, night sweats, weight loss, h/o cancer, IVDU.  RICE protocol and pain medicine indicated and discussed with patient.   Final Clinical Impression(s) / ED Diagnoses Final diagnoses:  Bilateral sciatica    Rx / DC Orders ED Discharge Orders         Ordered    celecoxib (CELEBREX) 200 MG capsule  2 times daily     10/18/19 1423    baclofen (LIORESAL) 10 MG tablet  3 times daily     10/18/19 1423    methylPREDNISolone (MEDROL DOSEPAK) 4 MG TBPK tablet     10/18/19 1423           Margarita Mail, PA-C 10/18/19 1435    Sherwood Gambler, MD 10/19/19 [phone]

## 2019-10-19 ENCOUNTER — Emergency Department (HOSPITAL_COMMUNITY)
Admission: EM | Admit: 2019-10-19 | Discharge: 2019-10-19 | Discharge status: Against Medical Advice | Payer: Medicare HMO | Attending: Emergency Medicine | Admitting: Emergency Medicine

## 2019-10-19 ENCOUNTER — Encounter (HOSPITAL_COMMUNITY): Payer: Self-pay | Admitting: Emergency Medicine

## 2019-10-19 DIAGNOSIS — F17210 Nicotine dependence, cigarettes, uncomplicated: Secondary | ICD-10-CM | POA: Insufficient documentation

## 2019-10-19 DIAGNOSIS — M5432 Sciatica, left side: Secondary | ICD-10-CM

## 2019-10-19 DIAGNOSIS — M5441 Lumbago with sciatica, right side: Secondary | ICD-10-CM | POA: Diagnosis not present

## 2019-10-19 DIAGNOSIS — M545 Low back pain: Secondary | ICD-10-CM | POA: Diagnosis not present

## 2019-10-19 DIAGNOSIS — M5442 Lumbago with sciatica, left side: Secondary | ICD-10-CM | POA: Diagnosis not present

## 2019-10-19 DIAGNOSIS — M5431 Sciatica, right side: Secondary | ICD-10-CM

## 2019-10-19 MED ORDER — METHYLPREDNISOLONE 4 MG PO TABS
4.0000 mg | ORAL_TABLET | Freq: Every day | ORAL | Status: DC
  Filled 2019-10-19 (×3): qty 1

## 2019-10-19 MED ORDER — BACLOFEN 10 MG PO TABS
10.0000 mg | ORAL_TABLET | Freq: Once | ORAL | Status: AC
  Administered 2019-10-19: 08:00:00 10 mg via ORAL
  Filled 2019-10-19: qty 1

## 2019-10-19 MED ORDER — CELECOXIB 100 MG PO CAPS
200.0000 mg | ORAL_CAPSULE | Freq: Once | ORAL | Status: AC
  Administered 2019-10-19: 08:00:00 200 mg via ORAL
  Filled 2019-10-19: qty 2

## 2019-10-19 NOTE — ED Triage Notes (Signed)
Bilateral leg pain. Seen yest for same. Did not get rx filled yet.

## 2019-10-19 NOTE — ED Provider Notes (Signed)
[facility] EMERGENCY DEPARTMENT Provider Note   CSN: [id number] Arrival date & time: 10/19/19  0086     History Chief Complaint  Patient presents with  . Leg Pain    [name] is a 49 y.o. male.  HPI He presents for evaluation and treatment of back pain, and buttock pain.  He states this pain has been present for 2 days.  He was previously evaluated for the same pain, yesterday.  He was given 3 prescriptions which he has not yet started taking.  He states the pain medicine that he came yesterday has "worn off."  He has chronic pain, previously received high-dose long-term narcotic therapy by a pain management doctor as recently as November 2020.  He stopped seeing that doctor because "it was too far to go."  He denies recent trauma.  There are no other known modifying factors.    Past Medical History:  Diagnosis Date  . Arthritis   . Chronic back pain   . History of degenerative disc disease   . History of spinal fusion     Patient Active Problem List   Diagnosis Date Noted  . Abdominal aortic aneurysm (AAA) without rupture ([place]) 03/11/2019  . Calculus of gallbladder without cholecystitis without obstruction   . Closed displaced fracture of fourth metacarpal bone of right hand, initial encounter  07/29/17 08/29/2017  . Radicular pain in right arm 04/22/2016  . Cervical radiculopathy 04/20/2016  . Cervical spondylosis 04/20/2016  . Hyperlipidemia 10/16/2013  . Allergic rhinitis 01/09/2013  . Chronic low back pain 12/17/2012    Past Surgical History:  Procedure Laterality Date  . AMPUTATION Left 04/28/2014   Procedure: 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;  Surgeon: Marcheta Grammes, DPM;  Location: [facility];  Service: Podiatry;  Laterality: Left;  . CHOLECYSTECTOMY N/A 06/13/2018   Procedure: LAPAROSCOPIC CHOLECYSTECTOMY;  Surgeon: Aviva Signs, MD;  Location: [facility];  Service: General;  Laterality: N/A;  . SPINAL FUSION         Family History  Problem Relation  Age of Onset  . Healthy Mother   . Aneurysm Father     Social History   Tobacco Use  . Smoking status: Current Every Day Smoker    Packs/day: 1.00    Types: Cigarettes  . Smokeless tobacco: Never Used  Substance Use Topics  . Alcohol use: No  . Drug use: No    Home Medications Prior to Admission medications   Medication Sig Start Date End Date Taking? Authorizing Provider  albuterol (PROVENTIL HFA;VENTOLIN HFA) 108 (90 Base) MCG/ACT inhaler Inhale 2 puffs into the lungs every 6 (six) hours as needed for wheezing. Patient not taking: Reported on 05/27/2019 09/12/18   Kathyrn Drown, MD  baclofen (LIORESAL) 10 MG tablet Take 1 tablet (10 mg total) by mouth 3 (three) times daily. 10/18/19   Margarita Mail, PA-C  celecoxib (CELEBREX) 200 MG capsule Take 1 capsule (200 mg total) by mouth 2 (two) times daily. 10/18/19   Margarita Mail, PA-C  cholestyramine Lucrezia Starch) 4 GM/DOSE powder 1 scoop Twice daily with meals mix with water or juice prn diarrhea Patient not taking: Reported on 09/30/2019 05/27/19   Kathyrn Drown, MD  methylPREDNISolone (MEDROL DOSEPAK) 4 MG TBPK tablet Use as directed 10/18/19   Margarita Mail, PA-C  penicillin v potassium (VEETID) 500 MG tablet Take 1 tablet (500 mg total) by mouth 4 (four) times daily. 10/07/19   Kathyrn Drown, MD  dicyclomine (BENTYL) 20 MG tablet Take 1 tablet (  20 mg total) by mouth 2 (two) times daily. Patient not taking: Reported on 10/07/2018 06/05/18 04/03/19  Franky Macho, MD    Allergies    Lyrica [pregabalin] and Toradol [ketorolac tromethamine]  Review of Systems   Review of Systems  All other systems reviewed and are negative.   Physical Exam Updated Vital Signs BP (!) 121/94 (BP Location: Right Arm)   Pulse 82   Temp 98.6 F (37 C) (Oral)   Resp 12   Ht 6\' 1"  (1.854 m)   Wt 90.7 kg   SpO2 97%   BMI 26.39 kg/m   Physical Exam Vitals and nursing note reviewed.  Constitutional:      General: He is not in acute  distress.    Appearance: He is well-developed. He is not ill-appearing, toxic-appearing or diaphoretic.     Comments: Lying semirecumbent on stretcher, sits up easily.  HENT:     Head: Normocephalic and atraumatic.     Right Ear: External ear normal.     Left Ear: External ear normal.  Eyes:     Conjunctiva/sclera: Conjunctivae normal.     Pupils: Pupils are equal, round, and reactive to light.  Neck:     Trachea: Phonation normal.  Cardiovascular:     Rate and Rhythm: Normal rate.  Pulmonary:     Effort: Pulmonary effort is normal. No respiratory distress.     Breath sounds: Normal breath sounds. No stridor.  Musculoskeletal:        General: Normal range of motion.     Cervical back: Normal range of motion and neck supple.     Comments: Lumbar spine nontender to palpation.  Skin:    General: Skin is warm and dry.  Neurological:     Mental Status: He is alert and oriented to person, place, and time.     Cranial Nerves: No cranial nerve deficit.     Sensory: No sensory deficit.     Motor: No abnormal muscle tone.     Coordination: Coordination normal.  Psychiatric:        Mood and Affect: Mood normal.        Behavior: Behavior normal.        Thought Content: Thought content normal.        Judgment: Judgment normal.     ED Results / Procedures / Treatments   Labs (all labs ordered are listed, but only abnormal results are displayed) Labs Reviewed - No data to display  EKG None  Radiology No results found.  Procedures Procedures (including critical care time)  Medications Ordered in ED Medications  baclofen (LIORESAL) tablet 10 mg (10 mg Oral Given 10/19/19 0747)  celecoxib (CELEBREX) capsule 200 mg (200 mg Oral Given 10/19/19 0747)    ED Course  I have reviewed the triage vital signs and the nursing notes.  Pertinent labs & imaging results that were available during my care of the patient were reviewed by me and considered in my medical decision making (see chart  for details).  Clinical Course as of Oct 19 1045  Mon Oct 19, 2019  Oct 21, 2019 Patient was offered medications which she was prescribed yesterday, since he has not yet started taking the prescriptions.  This is felt to be treatment for chronic pain.   [EW]    Clinical Course User Index [EW] 5573, MD   MDM Rules/Calculators/A&P  No data found.    Medical Decision Making: Patient with nonspecific back pain, working diagnosis of sciatica, seen yesterday and returned after 1 prescriptions which were given.  Treatment begun recently prescribed medications.  Patient chose to leave AGAINST MEDICAL ADVICE after receiving 2-3 other medications.  Doubt lumbar myelopathy  CRITICAL CARE- No Performed by: Mancel Bale   Nursing Notes Reviewed/ Care Coordinated Applicable Imaging Reviewed Interpretation of Laboratory Data incorporated into ED treatment  The patient appears reasonably screened and/or stabilized for discharge and I doubt any other medical condition or other [facility] requiring further screening, evaluation, or treatment in the ED at this time prior to discharge.  AGAINST MEDICAL ADVICE  Final Clinical Impression(s) / ED Diagnoses Final diagnoses:  None    Rx / DC Orders ED Discharge Orders    None       Mancel Bale, MD 10/20/19 1050

## 2019-10-19 NOTE — ED Notes (Signed)
Pt states that he wanted to leave and he would get his prescriptions fill and take them.

## 2019-10-27 DIAGNOSIS — R5382 Chronic fatigue, unspecified: Secondary | ICD-10-CM | POA: Diagnosis not present

## 2019-10-27 DIAGNOSIS — M2550 Pain in unspecified joint: Secondary | ICD-10-CM | POA: Diagnosis not present

## 2019-11-03 DIAGNOSIS — R972 Elevated prostate specific antigen [PSA]: Secondary | ICD-10-CM | POA: Diagnosis not present

## 2019-11-03 DIAGNOSIS — M62838 Other muscle spasm: Secondary | ICD-10-CM | POA: Diagnosis not present

## 2019-11-03 DIAGNOSIS — R5382 Chronic fatigue, unspecified: Secondary | ICD-10-CM | POA: Diagnosis not present

## 2019-11-03 DIAGNOSIS — G8929 Other chronic pain: Secondary | ICD-10-CM | POA: Diagnosis not present

## 2019-11-03 DIAGNOSIS — D510 Vitamin B12 deficiency anemia due to intrinsic factor deficiency: Secondary | ICD-10-CM | POA: Diagnosis not present

## 2019-11-03 DIAGNOSIS — E7849 Other hyperlipidemia: Secondary | ICD-10-CM | POA: Diagnosis not present

## 2019-11-03 DIAGNOSIS — I110 Hypertensive heart disease with heart failure: Secondary | ICD-10-CM | POA: Diagnosis not present

## 2019-11-03 DIAGNOSIS — R5383 Other fatigue: Secondary | ICD-10-CM | POA: Diagnosis not present

## 2019-11-03 DIAGNOSIS — E559 Vitamin D deficiency, unspecified: Secondary | ICD-10-CM | POA: Diagnosis not present

## 2019-11-11 DIAGNOSIS — J449 Chronic obstructive pulmonary disease, unspecified: Secondary | ICD-10-CM | POA: Diagnosis not present

## 2019-11-11 DIAGNOSIS — E559 Vitamin D deficiency, unspecified: Secondary | ICD-10-CM | POA: Diagnosis not present

## 2019-11-11 DIAGNOSIS — M2550 Pain in unspecified joint: Secondary | ICD-10-CM | POA: Diagnosis not present

## 2019-12-03 ENCOUNTER — Emergency Department (HOSPITAL_COMMUNITY)
Admission: EM | Admit: 2019-12-03 | Discharge: 2019-12-03 | Disposition: A | Discharge status: Home / Self Care | Payer: Medicare HMO | Attending: Emergency Medicine | Admitting: Emergency Medicine

## 2019-12-03 ENCOUNTER — Emergency Department (HOSPITAL_COMMUNITY): Payer: Medicare HMO

## 2019-12-03 ENCOUNTER — Other Ambulatory Visit: Payer: Self-pay

## 2019-12-03 ENCOUNTER — Encounter (HOSPITAL_COMMUNITY): Payer: Self-pay | Admitting: *Deleted

## 2019-12-03 DIAGNOSIS — F17210 Nicotine dependence, cigarettes, uncomplicated: Secondary | ICD-10-CM | POA: Diagnosis not present

## 2019-12-03 DIAGNOSIS — S299XXA Unspecified injury of thorax, initial encounter: Secondary | ICD-10-CM | POA: Diagnosis present

## 2019-12-03 DIAGNOSIS — Y929 Unspecified place or not applicable: Secondary | ICD-10-CM | POA: Diagnosis not present

## 2019-12-03 DIAGNOSIS — S2231XA Fracture of one rib, right side, initial encounter for closed fracture: Secondary | ICD-10-CM | POA: Insufficient documentation

## 2019-12-03 DIAGNOSIS — Y9389 Activity, other specified: Secondary | ICD-10-CM | POA: Insufficient documentation

## 2019-12-03 DIAGNOSIS — X500XXA Overexertion from strenuous movement or load, initial encounter: Secondary | ICD-10-CM | POA: Insufficient documentation

## 2019-12-03 DIAGNOSIS — Y999 Unspecified external cause status: Secondary | ICD-10-CM | POA: Diagnosis not present

## 2019-12-03 MED ORDER — IBUPROFEN 800 MG PO TABS
800.0000 mg | ORAL_TABLET | Freq: Once | ORAL | Status: AC
  Administered 2019-12-03: 800 mg via ORAL
  Filled 2019-12-03: qty 1

## 2019-12-03 MED ORDER — OXYCODONE-ACETAMINOPHEN 5-325 MG PO TABS: 1.0000 | ORAL_TABLET | ORAL | 0 refills | Status: DC | PRN

## 2019-12-03 MED ORDER — LIDOCAINE 5 % EX PTCH
1.0000 | MEDICATED_PATCH | CUTANEOUS | Status: DC
  Administered 2019-12-03: 1 via TRANSDERMAL
  Filled 2019-12-03: qty 1

## 2019-12-03 MED ORDER — METHOCARBAMOL 500 MG PO TABS: 500.0000 mg | ORAL_TABLET | Freq: Two times a day (BID) | ORAL | 0 refills | Status: DC

## 2019-12-03 NOTE — ED Triage Notes (Signed)
Patient presents to the ED with right side abdominal pain since lifting an object at work one day ago.

## 2019-12-03 NOTE — Discharge Instructions (Signed)
Take Percocet as needed for severe pain Take Robaxin as needed for muscle pain/spasms Take Tylenol or Ibuprofen for mild-moderate pain Return for worsening symptoms

## 2019-12-03 NOTE — ED Provider Notes (Signed)
[facility] EMERGENCY DEPARTMENT Provider Note   CSN: [id number] Arrival date & time: 12/03/19  0755     History Chief Complaint  Patient presents with  . Abdominal Pain    right    [name] is a 49 y.o. male with chronic pain syndrome who presents with right rib pain.  Patient states that he was lifting something heavy yesterday and he coughed hard and had an acute onset of stabbing and burning pain in the right lower rib.  He states that he has to cough often because he smokes and has allergies.  The pain is severe in nature and gets worse with coughing and deep breathing.  He feels his muscles are very tight. He has had similar symptoms before and was told that it was likely a torn muscle.  He states he is also cracked ribs from coughing too hard before.  He is status post cholecystectomy  HPI     Past Medical History:  Diagnosis Date  . Arthritis   . Chronic back pain   . History of degenerative disc disease   . History of spinal fusion     Patient Active Problem List   Diagnosis Date Noted  . Abdominal aortic aneurysm (AAA) without rupture ([place]) 03/11/2019  . Calculus of gallbladder without cholecystitis without obstruction   . Closed displaced fracture of fourth metacarpal bone of right hand, initial encounter  07/29/17 08/29/2017  . Radicular pain in right arm 04/22/2016  . Cervical radiculopathy 04/20/2016  . Cervical spondylosis 04/20/2016  . Hyperlipidemia 10/16/2013  . Allergic rhinitis 01/09/2013  . Chronic low back pain 12/17/2012    Past Surgical History:  Procedure Laterality Date  . AMPUTATION Left 04/28/2014   Procedure: 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;  Surgeon: Marcheta Grammes, DPM;  Location: [facility];  Service: Podiatry;  Laterality: Left;  . CHOLECYSTECTOMY N/A 06/13/2018   Procedure: LAPAROSCOPIC CHOLECYSTECTOMY;  Surgeon: Aviva Signs, MD;  Location: [facility];  Service: General;  Laterality: N/A;  . SPINAL FUSION         Family  History  Problem Relation Age of Onset  . Healthy Mother   . Aneurysm Father     Social History   Tobacco Use  . Smoking status: Current Every Day Smoker    Packs/day: 1.00    Types: Cigarettes  . Smokeless tobacco: Never Used  Substance Use Topics  . Alcohol use: No  . Drug use: No    Home Medications Prior to Admission medications   Medication Sig Start Date End Date Taking? Authorizing Provider  albuterol (PROVENTIL HFA;VENTOLIN HFA) 108 (90 Base) MCG/ACT inhaler Inhale 2 puffs into the lungs every 6 (six) hours as needed for wheezing. Patient not taking: Reported on 05/27/2019 09/12/18   Kathyrn Drown, MD  baclofen (LIORESAL) 10 MG tablet Take 1 tablet (10 mg total) by mouth 3 (three) times daily. 10/18/19   Margarita Mail, PA-C  celecoxib (CELEBREX) 200 MG capsule Take 1 capsule (200 mg total) by mouth 2 (two) times daily. 10/18/19   Margarita Mail, PA-C  cholestyramine Lucrezia Starch) 4 GM/DOSE powder 1 scoop Twice daily with meals mix with water or juice prn diarrhea Patient not taking: Reported on 09/30/2019 05/27/19   Kathyrn Drown, MD  methylPREDNISolone (MEDROL DOSEPAK) 4 MG TBPK tablet Use as directed 10/18/19   Margarita Mail, PA-C  penicillin v potassium (VEETID) 500 MG tablet Take 1 tablet (500 mg total) by mouth 4 (four) times daily. 10/07/19   Sallee Lange  A, MD  dicyclomine (BENTYL) 20 MG tablet Take 1 tablet (20 mg total) by mouth 2 (two) times daily. Patient not taking: Reported on 10/07/2018 06/05/18 04/03/19  Franky Macho, MD    Allergies    Lyrica [pregabalin] and Toradol [ketorolac tromethamine]  Review of Systems   Review of Systems  Respiratory: Positive for shortness of breath.   Cardiovascular: Negative for chest pain.       +rib pain  Gastrointestinal: Negative for abdominal pain, nausea and vomiting.    Physical Exam Updated Vital Signs BP (!) 122/106 (BP Location: Right Arm)   Pulse 77   Temp 98.2 F (36.8 C) (Oral)   Resp 18   Ht 6\' 1"  (1.854  m)   Wt 95.3 kg   SpO2 100%   BMI 27.71 kg/m   Physical Exam Vitals and nursing note reviewed.  Constitutional:      General: He is not in acute distress.    Appearance: He is well-developed. He is not ill-appearing.  HENT:     Head: Normocephalic and atraumatic.  Eyes:     General: No scleral icterus.       Right eye: No discharge.        Left eye: No discharge.     Conjunctiva/sclera: Conjunctivae normal.     Pupils: Pupils are equal, round, and reactive to light.  Cardiovascular:     Rate and Rhythm: Normal rate and regular rhythm.  Pulmonary:     Effort: Pulmonary effort is normal. No respiratory distress.     Breath sounds: Normal breath sounds.  Chest:     Chest wall: Tenderness (right anterior rib) present.  Abdominal:     General: Bowel sounds are normal. There is no distension.     Palpations: Abdomen is soft.     Tenderness: There is abdominal tenderness in the right upper quadrant.     Comments: Well healed laparoscopic scars  Musculoskeletal:     Cervical back: Normal range of motion.  Skin:    General: Skin is warm and dry.  Neurological:     Mental Status: He is alert and oriented to person, place, and time.  Psychiatric:        Behavior: Behavior normal.     ED Results / Procedures / Treatments   Labs (all labs ordered are listed, but only abnormal results are displayed) Labs Reviewed - No data to display  EKG None  Radiology DG Ribs Unilateral W/Chest Right  Result Date: 12/03/2019 CLINICAL DATA:  Chest pain EXAM: RIGHT RIBS AND CHEST - 3+ VIEW COMPARISON:  Chest and ribs August 19, 2019 FINDINGS: Frontal chest as well as oblique and cone-down rib images were obtained. Lungs are hyperexpanded, stable. There is minimal atelectasis in the left base. The lungs elsewhere are clear. Heart size and pulmonary vascularity are normal. No adenopathy. There is an incomplete nondisplaced fracture in the anterior right eighth rib. There is evidence of old  trauma with remodeling involving the anterior right seventh rib. No other evident fracture. No pneumothorax or pleural effusion. IMPRESSION: Incomplete nondisplaced fracture anterior right eighth rib. Old fracture with remodeling anterior right seventh rib. No pneumothorax. Lungs are clear except for minimal left base atelectasis. Electronically Signed   By: August 21, 2019 III M.D.   On: 12/03/2019 09:05    Procedures Procedures (including critical care time)  Medications Ordered in ED Medications  lidocaine (LIDODERM) 5 % 1 patch (1 patch Transdermal Patch Applied 12/03/19 0903)  ibuprofen (ADVIL) tablet 800 mg (  800 mg Oral Given 12/03/19 0684)    ED Course  I have reviewed the triage vital signs and the nursing notes.  Pertinent labs & imaging results that were available during my care of the patient were reviewed by me and considered in my medical decision making (see chart for details).  49 year old male presents with acute, severe right lower rib pain after lifting and coughing hard yesterday.  Vital signs are reassuring.  Heart is regular rate and rhythm.  Lungs are clear to auscultation.  X-ray was obtained which shows an acute fracture of the anterior right eighth rib.  Will give prescription for pain medicine.  He was offered incentive spirometer and I explained this is to prevent pneumonia however he is declining this stating he will not use it.  He is advised to return if worsening.  MDM Rules/Calculators/A&P                      Final Clinical Impression(s) / ED Diagnoses Final diagnoses:  Closed fracture of one rib of right side, initial encounter    Rx / DC Orders ED Discharge Orders    None       Bethel Born, PA-C 12/03/19 0930    Donnetta Hutching, MD 12/07/19 [phone]

## 2019-12-21 DIAGNOSIS — M545 Low back pain: Secondary | ICD-10-CM | POA: Diagnosis not present

## 2019-12-21 DIAGNOSIS — E7849 Other hyperlipidemia: Secondary | ICD-10-CM | POA: Diagnosis not present

## 2019-12-21 DIAGNOSIS — M7552 Bursitis of left shoulder: Secondary | ICD-10-CM | POA: Diagnosis not present

## 2019-12-21 DIAGNOSIS — M2550 Pain in unspecified joint: Secondary | ICD-10-CM | POA: Diagnosis not present

## 2019-12-31 ENCOUNTER — Other Ambulatory Visit: Payer: Self-pay

## 2019-12-31 ENCOUNTER — Emergency Department (HOSPITAL_COMMUNITY)
Admission: EM | Admit: 2019-12-31 | Discharge: 2019-12-31 | Disposition: A | Discharge status: Home / Self Care | Payer: Medicare HMO | Attending: Emergency Medicine | Admitting: Emergency Medicine

## 2019-12-31 ENCOUNTER — Encounter (HOSPITAL_COMMUNITY): Payer: Self-pay | Admitting: Emergency Medicine

## 2019-12-31 DIAGNOSIS — Z79899 Other long term (current) drug therapy: Secondary | ICD-10-CM | POA: Diagnosis not present

## 2019-12-31 DIAGNOSIS — K0889 Other specified disorders of teeth and supporting structures: Secondary | ICD-10-CM | POA: Diagnosis not present

## 2019-12-31 DIAGNOSIS — F17210 Nicotine dependence, cigarettes, uncomplicated: Secondary | ICD-10-CM | POA: Insufficient documentation

## 2019-12-31 MED ORDER — AMOXICILLIN-POT CLAVULANATE 875-125 MG PO TABS: 1.0000 | ORAL_TABLET | Freq: Two times a day (BID) | ORAL | 0 refills | Status: DC

## 2019-12-31 NOTE — ED Provider Notes (Signed)
[facility] EMERGENCY DEPARTMENT Provider Note   CSN: [id number] Arrival date & time: 12/31/19  1347     History Chief Complaint  Patient presents with  . Dental Pain    [name] is a 49 y.o. male with PMH significant for degenerative disc disease and chronic back pain who presents to the ED with dental pain.  Patient reports that he has a chipped tooth on the right lower jaw that has been causing him some significant discomfort.  He noticed some right-sided facial swelling this morning which prompted him to come to the ED for evaluation.  Patient reports that it has been difficult for him to fall asleep due to his discomfort.  He is requesting pain medication as he recently ran out of his pain pills.  He is in between primary care providers due to disagreement.  He does not see a dentist regularly and states that he typically waits until these events happen that require dental intervention.  He called into [facility] and has an appointment scheduled for the middle of next week for dental extraction.  He denies any fevers or chills, neck pain, difficulty swallowing, difficulty breathing, wheezing, difficulty tolerating secretions, trismus, or other symptoms.  HPI     Past Medical History:  Diagnosis Date  . Arthritis   . Chronic back pain   . History of degenerative disc disease   . History of spinal fusion     Patient Active Problem List   Diagnosis Date Noted  . Abdominal aortic aneurysm (AAA) without rupture (HCC) 03/11/2019  . Calculus of gallbladder without cholecystitis without obstruction   . Closed displaced fracture of fourth metacarpal bone of right hand, initial encounter  07/29/17 08/29/2017  . Radicular pain in right arm 04/22/2016  . Cervical radiculopathy 04/20/2016  . Cervical spondylosis 04/20/2016  . Hyperlipidemia 10/16/2013  . Allergic rhinitis 01/09/2013  . Chronic low back pain 12/17/2012    Past Surgical History:  Procedure  Laterality Date  . 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 CHOLECYSTECTOMY N/A 06/13/2018   Procedure: LAPAROSCOPIC CHOLECYSTECTOMY;  Surgeon: Franky Macho, MD;  Location: [facility];  Service: General;  Laterality: N/A;  . SPINAL FUSION         Family History  Problem Relation Age of Onset  . Healthy Mother   . Aneurysm Father     Social History   Tobacco Use  . Smoking status: Current Every Day Smoker    Packs/day: 1.00    Types: Cigarettes  . Smokeless tobacco: Never Used  Substance Use Topics  . Alcohol use: No  . Drug use: No    Home Medications Prior to Admission medications   Medication Sig Start Date End Date Taking? Authorizing Provider  albuterol (PROVENTIL HFA;VENTOLIN HFA) 108 (90 Base) MCG/ACT inhaler Inhale 2 puffs into the lungs every 6 (six) hours as needed for wheezing. Patient not taking: Reported on 05/27/2019 09/12/18   Babs Sciara, MD  amoxicillin-clavulanate (AUGMENTIN) 875-125 MG tablet Take 1 tablet by mouth every 12 (twelve) hours. 12/31/19   Lorelee New, PA-C  baclofen (LIORESAL) 10 MG tablet Take 1 tablet (10 mg total) by mouth 3 (three) times daily. 10/18/19   Arthor Captain, PA-C  celecoxib (CELEBREX) 200 MG capsule Take 1 capsule (200 mg total) by mouth 2 (two) times daily. 10/18/19   Arthor Captain, PA-C  cholestyramine (QUESTRAN) 4 GM/DOSE powder 1 scoop Twice daily with meals mix with water or juice prn diarrhea Patient not taking: Reported on 09/30/2019 05/27/19   Kathyrn Drown, MD  methocarbamol (ROBAXIN) 500 MG tablet Take 1 tablet (500 mg total) by mouth 2 (two) times daily. 12/03/19   Recardo Evangelist, PA-C  methylPREDNISolone (MEDROL DOSEPAK) 4 MG TBPK tablet Use as directed 10/18/19   Margarita Mail, PA-C  oxyCODONE-acetaminophen (PERCOCET/ROXICET) 5-325 MG tablet Take 1 tablet by mouth every 4 (four) hours  as needed for severe pain. 12/03/19   Recardo Evangelist, PA-C  penicillin v potassium (VEETID) 500 MG tablet Take 1 tablet (500 mg total) by mouth 4 (four) times daily. 10/07/19   Kathyrn Drown, MD  dicyclomine (BENTYL) 20 MG tablet Take 1 tablet (20 mg total) by mouth 2 (two) times daily. Patient not taking: Reported on 10/07/2018 06/05/18 04/03/19  Aviva Signs, MD    Allergies    Lyrica [pregabalin] and Toradol [ketorolac tromethamine]  Review of Systems   Review of Systems  Constitutional: Negative for chills and fever.  HENT: Positive for dental problem and facial swelling. Negative for drooling, trouble swallowing and voice change.   Respiratory: Negative for shortness of breath and wheezing.   Musculoskeletal: Negative for neck pain.    Physical Exam Updated Vital Signs BP 127/82 (BP Location: Right Arm)   Pulse 75   Temp 98 F (36.7 C) (Oral)   Resp 18   Ht 6\' 1"  (1.854 m)   Wt 95 kg   SpO2 95%   BMI 27.63 kg/m   Physical Exam Vitals and nursing note reviewed. Exam conducted with a chaperone present.  Constitutional:      Appearance: Normal appearance.  HENT:     Head: Normocephalic and atraumatic.     Mouth/Throat:     Comments: Patent oropharynx.  No tonsillar hypertrophy or uvular deviation.  No tongue swelling or floor of mouth induration.  #29 tooth is broken and there is some surrounding erythema.  There is evidence of numerous prior dental extractions.  Poor dentition throughout.  No masses or areas of fluctuance concerning for abscess.  No trismus.  Tolerating secretions well. Eyes:     General: No scleral icterus.    Conjunctiva/sclera: Conjunctivae normal.  Cardiovascular:     Rate and Rhythm: Normal rate and regular rhythm.     Pulses: Normal pulses.     Heart sounds: Normal heart sounds.  Pulmonary:     Effort: Pulmonary effort is normal. No respiratory distress.     Breath sounds: Normal breath sounds.  Musculoskeletal:     Cervical back: Normal  range of motion. No rigidity.  Skin:    General: Skin is dry.     Capillary Refill: Capillary refill takes less than 2 seconds.  Neurological:     Mental Status: He is alert and oriented to person, place, and time.     GCS: GCS eye subscore is 4. GCS verbal subscore is 5. GCS motor subscore is 6.  Psychiatric:        Mood and Affect: Mood normal.        Behavior: Behavior normal.        Thought Content: Thought content normal.     ED Results / Procedures / Treatments   Labs (all labs ordered are listed, but only abnormal results are displayed) Labs Reviewed - No data to display  EKG None  Radiology No results found.  Procedures Procedures (including critical care  time)  Medications Ordered in ED Medications - No data to display  ED Course  I have reviewed the triage vital signs and the nursing notes.  Pertinent labs & imaging results that were available during my care of the patient were reviewed by me and considered in my medical decision making (see chart for details).    MDM Rules/Calculators/A&P                      Patient is presenting to the ED for pain symptoms related to his broken #29 tooth.  There is some surrounding erythema concerning for infection and will treat with Augmentin x7 days.  No identifiable abscess amenable to drainage.  Advising that patient continue ibuprofen given his reported pain and swelling.  Patient reports that he does not like applying ice, however I stated that it would be beneficial for his facial swelling symptoms.  While patient is requesting a prescription for pain medication, I reviewed the PDMP and patient had received a 30-day prescription for tramadol on 12/09/2019 and a 30-day prescription for oxycodone on 12/21/2019.  Patient has medical insurance, but does not have a primary care provider.  He states that it is due to a disagreement that he had with his PCP.  I strongly urged patient to get established with a primary care provider  for ongoing evaluation and management of his health and wellbeing.  Also recommending that he receive regular dental care moving forward given his poor dentition throughout.  Patient will need to be seen for his dental extraction, as scheduled, next week.  Patient does not have any trismus, difficulty tolerating secretions, wheezing, breathing difficulty, floor of mouth induration, or difficulty swallowing that would otherwise be concerning for a large deep tissue abscess.  Strict ED return precautions discussed.  Patient voices understanding and is agreeable to the plan.  Final Clinical Impression(s) / ED Diagnoses Final diagnoses:  Pain, dental    Rx / DC Orders ED Discharge Orders         Ordered    amoxicillin-clavulanate (AUGMENTIN) 875-125 MG tablet  Every 12 hours     12/31/19 1742           Elvera Maria 12/31/19 1745    Pollyann Savoy, MD 12/31/19 2009

## 2019-12-31 NOTE — Discharge Instructions (Addendum)
Please go to your scheduled appointment with [facility] next week.  Call again to be placed on their waiting list in the event a spot opens up sooner.  Please call around to the other dental offices to inquire as to how soon they would be able to accommodate you.  You have been seen in the Emergency Department (ED) today for dental pain.  Please take your prescribed antibiotic.  You may take pain medication as needed but ONLY as prescribed.  You may also take over-the-counter pain medication such as ibuprofen or Tylenol according to the label instructions unless a doctor has previously told you to avoid this type of medication (due to stomach ulcers, kidney disease or liver disease, for example).   Please see you dentist as soon as possible; only a dentist will be able to fix your problem(s).    Return to the ED if you develop worsening pain, fever, pus/drainage, difficulty breathing, or any other new or worsening symptoms.

## 2019-12-31 NOTE — ED Triage Notes (Signed)
Pt reports right sided dental pain with minimal swelling. Pt reports "tooth chipped off" and reports has not been able to get into dental practice. nad noted. Pt denies fever, n/v.

## 2020-01-19 DIAGNOSIS — M2550 Pain in unspecified joint: Secondary | ICD-10-CM | POA: Diagnosis not present

## 2020-01-19 DIAGNOSIS — E7849 Other hyperlipidemia: Secondary | ICD-10-CM | POA: Diagnosis not present

## 2020-01-19 DIAGNOSIS — E559 Vitamin D deficiency, unspecified: Secondary | ICD-10-CM | POA: Diagnosis not present

## 2020-01-19 DIAGNOSIS — G8929 Other chronic pain: Secondary | ICD-10-CM | POA: Diagnosis not present

## 2020-02-17 DIAGNOSIS — E559 Vitamin D deficiency, unspecified: Secondary | ICD-10-CM | POA: Diagnosis not present

## 2020-02-17 DIAGNOSIS — K219 Gastro-esophageal reflux disease without esophagitis: Secondary | ICD-10-CM | POA: Diagnosis not present

## 2020-02-17 DIAGNOSIS — G8929 Other chronic pain: Secondary | ICD-10-CM | POA: Diagnosis not present

## 2020-02-17 DIAGNOSIS — E7849 Other hyperlipidemia: Secondary | ICD-10-CM | POA: Diagnosis not present

## 2020-03-17 ENCOUNTER — Other Ambulatory Visit: Payer: Self-pay

## 2020-03-17 ENCOUNTER — Ambulatory Visit (HOSPITAL_COMMUNITY)
Admission: RE | Admit: 2020-03-17 | Discharge: 2020-03-17 | Disposition: A | Discharge status: Home / Self Care | Payer: Medicare HMO | Source: Ambulatory Visit | Attending: Family Medicine | Admitting: Family Medicine

## 2020-03-17 ENCOUNTER — Other Ambulatory Visit (HOSPITAL_COMMUNITY): Payer: Self-pay | Admitting: Family Medicine

## 2020-03-17 DIAGNOSIS — S92512A Displaced fracture of proximal phalanx of left lesser toe(s), initial encounter for closed fracture: Secondary | ICD-10-CM | POA: Diagnosis not present

## 2020-03-17 DIAGNOSIS — R52 Pain, unspecified: Secondary | ICD-10-CM

## 2020-03-17 DIAGNOSIS — M2550 Pain in unspecified joint: Secondary | ICD-10-CM | POA: Diagnosis not present

## 2020-03-17 DIAGNOSIS — G8929 Other chronic pain: Secondary | ICD-10-CM | POA: Diagnosis not present

## 2020-03-17 DIAGNOSIS — M545 Low back pain: Secondary | ICD-10-CM | POA: Diagnosis not present

## 2020-03-17 DIAGNOSIS — Z89422 Acquired absence of other left toe(s): Secondary | ICD-10-CM | POA: Diagnosis not present

## 2020-04-14 DIAGNOSIS — G8929 Other chronic pain: Secondary | ICD-10-CM | POA: Diagnosis not present

## 2020-04-14 DIAGNOSIS — G609 Hereditary and idiopathic neuropathy, unspecified: Secondary | ICD-10-CM | POA: Diagnosis not present

## 2020-04-14 DIAGNOSIS — M545 Low back pain: Secondary | ICD-10-CM | POA: Diagnosis not present

## 2020-04-14 DIAGNOSIS — M2550 Pain in unspecified joint: Secondary | ICD-10-CM | POA: Diagnosis not present

## 2021-02-05 ENCOUNTER — Emergency Department (HOSPITAL_COMMUNITY): Payer: Medicare HMO

## 2021-02-05 ENCOUNTER — Encounter (HOSPITAL_COMMUNITY): Payer: Self-pay | Admitting: Emergency Medicine

## 2021-02-05 ENCOUNTER — Emergency Department (HOSPITAL_COMMUNITY)
Admission: EM | Admit: 2021-02-05 | Discharge: 2021-02-05 | Disposition: A | Discharge status: Home / Self Care | Payer: Medicare HMO | Attending: Emergency Medicine | Admitting: Emergency Medicine

## 2021-02-05 ENCOUNTER — Other Ambulatory Visit: Payer: Self-pay

## 2021-02-05 DIAGNOSIS — R112 Nausea with vomiting, unspecified: Secondary | ICD-10-CM | POA: Insufficient documentation

## 2021-02-05 DIAGNOSIS — R1084 Generalized abdominal pain: Secondary | ICD-10-CM | POA: Insufficient documentation

## 2021-02-05 DIAGNOSIS — F17210 Nicotine dependence, cigarettes, uncomplicated: Secondary | ICD-10-CM | POA: Diagnosis not present

## 2021-02-05 DIAGNOSIS — R1030 Lower abdominal pain, unspecified: Secondary | ICD-10-CM

## 2021-02-05 LAB — CBC
HCT: 51.1 % (ref 39.0–52.0)
Hemoglobin: 16.8 g/dL (ref 13.0–17.0)
MCH: 31.3 pg (ref 26.0–34.0)
MCHC: 32.9 g/dL (ref 30.0–36.0)
MCV: 95.3 fL (ref 80.0–100.0)
Platelets: 289 10*3/uL (ref 150–400)
RBC: 5.36 MIL/uL (ref 4.22–5.81)
RDW: 13.2 % (ref 11.5–15.5)
WBC: 9.4 10*3/uL (ref 4.0–10.5)
nRBC: 0 % (ref 0.0–0.2)

## 2021-02-05 LAB — COMPREHENSIVE METABOLIC PANEL
ALT: 58 U/L — ABNORMAL HIGH (ref 0–44)
AST: 32 U/L (ref 15–41)
Albumin: 4.9 g/dL (ref 3.5–5.0)
Alkaline Phosphatase: 94 U/L (ref 38–126)
Anion gap: 8 (ref 5–15)
BUN: 13 mg/dL (ref 6–20)
CO2: 25 mmol/L (ref 22–32)
Calcium: 9.8 mg/dL (ref 8.9–10.3)
Chloride: 103 mmol/L (ref 98–111)
Creatinine, Ser: 1.01 mg/dL (ref 0.61–1.24)
GFR, Estimated: >60 mL/min (ref >60)
Glucose, Bld: 120 mg/dL — ABNORMAL HIGH (ref 70–99)
Potassium: 4.2 mmol/L (ref 3.5–5.1)
Sodium: 136 mmol/L (ref 135–145)
Total Bilirubin: 0.7 mg/dL (ref 0.3–1.2)
Total Protein: 8.3 g/dL — ABNORMAL HIGH (ref 6.5–8.1)

## 2021-02-05 LAB — LIPASE, BLOOD: Lipase: 30 U/L (ref 11–51)

## 2021-02-05 MED ORDER — SODIUM CHLORIDE 0.9 % IV BOLUS
1000.0000 mL | Freq: Once | INTRAVENOUS | Status: AC
  Administered 2021-02-05: 1000 mL via INTRAVENOUS

## 2021-02-05 MED ORDER — ONDANSETRON HCL 4 MG/2ML IJ SOLN
4.0000 mg | Freq: Once | INTRAMUSCULAR | Status: AC
  Administered 2021-02-05: 4 mg via INTRAVENOUS
  Filled 2021-02-05: qty 2

## 2021-02-05 MED ORDER — OXYCODONE-ACETAMINOPHEN 5-325 MG PO TABS: 1.0000 | ORAL_TABLET | Freq: Four times a day (QID) | ORAL | 0 refills | Status: DC | PRN

## 2021-02-05 MED ORDER — ONDANSETRON 4 MG PO TBDP: ORAL_TABLET | ORAL | 0 refills | Status: DC

## 2021-02-05 MED ORDER — ONDANSETRON HCL 4 MG/2ML IJ SOLN
4.0000 mg | Freq: Once | INTRAMUSCULAR | Status: AC
  Administered 2021-02-05: 4 mg via INTRAVENOUS

## 2021-02-05 MED ORDER — HYDROMORPHONE HCL 1 MG/ML IJ SOLN
0.5000 mg | Freq: Once | INTRAMUSCULAR | Status: AC
  Administered 2021-02-05: 0.5 mg via INTRAVENOUS
  Filled 2021-02-05: qty 1

## 2021-02-05 NOTE — ED Triage Notes (Signed)
Pt reports abdominal pain x 2 days. Pt reports N/V and diarrhea. Denies fevers.

## 2021-02-05 NOTE — ED Provider Notes (Signed)
[facility] EMERGENCY DEPARTMENT Provider Note   CSN: [id number] Arrival date & time: 02/05/21  6644     History Chief Complaint  Patient presents with  . Abdominal Pain    [name] is a 50 y.o. male.  Patient with nausea vomiting and abdominal pain no fever no chills no cough.  He has not vomited any blood  The history is provided by the patient and medical records.  Abdominal Pain Pain location:  Generalized Pain quality: aching   Pain radiates to:  Does not radiate Pain severity:  Moderate Onset quality:  Sudden Timing:  Constant Progression:  Waxing and waning Chronicity:  New Context: not alcohol use   Associated symptoms: no chest pain, no cough, no diarrhea, no fatigue and no hematuria        Past Medical History:  Diagnosis Date  . Arthritis   . Chronic back pain   . History of degenerative disc disease   . History of spinal fusion     Patient Active Problem List   Diagnosis Date Noted  . Abdominal aortic aneurysm (AAA) without rupture (HCC) 03/11/2019  . Calculus of gallbladder without cholecystitis without obstruction   . Closed displaced fracture of fourth metacarpal bone of right hand, initial encounter  07/29/17 08/29/2017  . Radicular pain in right arm 04/22/2016  . Cervical radiculopathy 04/20/2016  . Cervical spondylosis 04/20/2016  . Hyperlipidemia 10/16/2013  . Allergic rhinitis 01/09/2013  . Chronic low back pain 12/17/2012    Past Surgical History:  Procedure Laterality Date  . 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 CHOLECYSTECTOMY N/A 06/13/2018   Procedure: LAPAROSCOPIC CHOLECYSTECTOMY;  Surgeon: Franky Macho, MD;  Location: [facility];  Service: General;  Laterality: N/A;  . SPINAL FUSION         Family History  Problem Relation Age of Onset  . Healthy Mother   . Aneurysm Father     Social  History   Tobacco Use  . Smoking status: Current Every Day Smoker    Packs/day: 1.00    Types: Cigarettes  . Smokeless tobacco: Never Used  Vaping Use  . Vaping Use: Never used  Substance Use Topics  . Alcohol use: No  . Drug use: No    Home Medications Prior to Admission medications   Medication Sig Start Date End Date Taking? Authorizing Provider  ondansetron (ZOFRAN ODT) 4 MG disintegrating tablet 4mg  ODT q4 hours prn nausea/vomit 02/05/21  Yes 02/07/21, MD  oxyCODONE-acetaminophen (PERCOCET) 5-325 MG tablet Take 1 tablet by mouth every 6 (six) hours as needed. 02/05/21  Yes 02/07/21, MD  albuterol (PROVENTIL HFA;VENTOLIN HFA) 108 (90 Base) MCG/ACT inhaler Inhale 2 puffs into the lungs every 6 (six) hours as needed for wheezing. Patient not taking: Reported on 05/27/2019 09/12/18   09/14/18, MD  amoxicillin-clavulanate (AUGMENTIN) 875-125 MG tablet Take 1 tablet by mouth every 12 (twelve) hours. 12/31/19   01/02/20, PA-C  baclofen (LIORESAL) 10 MG tablet Take 1 tablet (10 mg total) by mouth 3 (three) times daily. 10/18/19   10/20/19, PA-C  celecoxib (CELEBREX) 200 MG capsule Take 1 capsule (200 mg total) by mouth 2 (two) times daily. 10/18/19   10/20/19, PA-C  cholestyramine Arthor Captain) 4 GM/DOSE powder 1 scoop Twice daily with meals mix with water or juice prn diarrhea Patient not taking: Reported on 09/30/2019  05/27/19   Babs Sciara, MD  methocarbamol (ROBAXIN) 500 MG tablet Take 1 tablet (500 mg total) by mouth 2 (two) times daily. 12/03/19   Bethel Born, PA-C  methylPREDNISolone (MEDROL DOSEPAK) 4 MG TBPK tablet Use as directed 10/18/19   Arthor Captain, PA-C  penicillin v potassium (VEETID) 500 MG tablet Take 1 tablet (500 mg total) by mouth 4 (four) times daily. 10/07/19   Babs Sciara, MD  dicyclomine (BENTYL) 20 MG tablet Take 1 tablet (20 mg total) by mouth 2 (two) times daily. Patient not taking: Reported on 10/07/2018 06/05/18  04/03/19  Franky Macho, MD    Allergies    Lyrica [pregabalin] and Toradol [ketorolac tromethamine]  Review of Systems   Review of Systems  Constitutional: Negative for appetite change and fatigue.  HENT: Negative for congestion, ear discharge and sinus pressure.   Eyes: Negative for discharge.  Respiratory: Negative for cough.   Cardiovascular: Negative for chest pain.  Gastrointestinal: Positive for abdominal pain. Negative for diarrhea.  Genitourinary: Negative for frequency and hematuria.  Musculoskeletal: Negative for back pain.  Skin: Negative for rash.  Neurological: Negative for seizures and headaches.  Psychiatric/Behavioral: Negative for hallucinations.    Physical Exam Updated Vital Signs BP (!) 159/108 (BP Location: Right Arm)   Pulse 70   Temp 97.9 F (36.6 C) (Oral)   Resp 18   SpO2 97%   Physical Exam Vitals and nursing note reviewed.  Constitutional:      Appearance: He is well-developed.  HENT:     Head: Normocephalic.     Nose: Nose normal.  Eyes:     General: No scleral icterus.    Conjunctiva/sclera: Conjunctivae normal.  Neck:     Thyroid: No thyromegaly.  Cardiovascular:     Rate and Rhythm: Normal rate and regular rhythm.     Heart sounds: No murmur heard. No friction rub. No gallop.   Pulmonary:     Breath sounds: No stridor. No wheezing or rales.  Chest:     Chest wall: No tenderness.  Abdominal:     General: There is no distension.     Tenderness: There is abdominal tenderness. There is no rebound.  Musculoskeletal:        General: Normal range of motion.     Cervical back: Neck supple.  Lymphadenopathy:     Cervical: No cervical adenopathy.  Skin:    Findings: No erythema or rash.  Neurological:     Mental Status: He is alert and oriented to person, place, and time.     Motor: No abnormal muscle tone.     Coordination: Coordination normal.  Psychiatric:        Behavior: Behavior normal.     ED Results / Procedures /  Treatments   Labs (all labs ordered are listed, but only abnormal results are displayed) Labs Reviewed  COMPREHENSIVE METABOLIC PANEL - Abnormal; Notable for the following components:      Result Value   Glucose, Bld 120 (*)    Total Protein 8.3 (*)    ALT 58 (*)    All other components within normal limits  LIPASE, BLOOD  CBC    EKG None  Radiology CT ABDOMEN PELVIS WO CONTRAST  Result Date: 02/05/2021 CLINICAL DATA:  Abdominal pain for 2 days. Nausea and vomiting. Diarrhea. EXAM: CT ABDOMEN AND PELVIS WITHOUT CONTRAST TECHNIQUE: Multidetector CT imaging of the abdomen and pelvis was performed following the standard protocol without IV contrast. COMPARISON:  05/18/2018 FINDINGS: Lower  chest: No acute findings. Hepatobiliary: No mass visualized on this unenhanced exam. Moderate diffuse hepatic steatosis is increased since previous study. Prior cholecystectomy. No evidence of biliary obstruction. Pancreas: No mass or inflammatory process visualized on this unenhanced exam. Spleen:  Within normal limits in size. Adrenals/Urinary tract: 2 mm calculus noted in upper pole of left kidney. No evidence of ureteral calculi or hydronephrosis. Unremarkable unopacified urinary bladder. Stomach/Bowel: No evidence of obstruction, inflammatory process, or abnormal fluid collections. Normal appendix visualized. Vascular/Lymphatic: No pathologically enlarged lymph nodes identified. 3.0 cm infrarenal abdominal aortic aneurysm is seen, slightly increased in size from 2.8 cm previously. Reproductive: Previous right orchiectomy. No mass or other significant abnormality. Other:  None. Musculoskeletal: No suspicious bone lesions identified. Lower lumbar spine fusion hardware again noted. IMPRESSION: Tiny left renal calculus. No evidence of ureteral calculi, hydronephrosis, or other acute findings. Moderate hepatic steatosis. 3.0 cm infrarenal abdominal aortic aneurysm. Recommend follow-up ultrasound every 3 years. This  recommendation follows ACR consensus guidelines: White Paper of the ACR Incidental Findings Committee II on Vascular Findings. J Am Coll Radiol 2013; 10:789-794. Electronically Signed   By: Danae Orleans M.D.   On: 02/05/2021 21:02    Procedures Procedures   Medications Ordered in ED Medications  HYDROmorphone (DILAUDID) injection 0.5 mg (0.5 mg Intravenous Given 02/05/21 2033)  ondansetron [facility]) injection 4 mg (4 mg Intravenous Given 02/05/21 2033)  sodium chloride 0.9 % bolus 1,000 mL (0 mLs Intravenous Stopped 02/05/21 2149)  HYDROmorphone (DILAUDID) injection 0.5 mg (0.5 mg Intravenous Given 02/05/21 2148)  ondansetron (ZOFRAN) injection 4 mg (4 mg Intravenous Given 02/05/21 2153)    ED Course  I have reviewed the triage vital signs and the nursing notes.  Pertinent labs & imaging results that were available during my care of the patient were reviewed by me and considered in my medical decision making (see chart for details).    MDM Rules/Calculators/A&P                          Patient with gastroenteritis.  He is given some pain medicine and nausea medicine will follow up with PCP Final Clinical Impression(s) / ED Diagnoses Final diagnoses:  Lower abdominal pain    Rx / DC Orders ED Discharge Orders         Ordered    oxyCODONE-acetaminophen (PERCOCET) 5-325 MG tablet  Every 6 hours PRN        02/05/21 2231    ondansetron (ZOFRAN ODT) 4 MG disintegrating tablet        02/05/21 2231           Bethann Berkshire, MD 02/07/21 1002

## 2021-02-05 NOTE — Discharge Instructions (Addendum)
Drink plenty of fluids and follow-up with your family doctor in a couple days.  Return if any problem

## 2021-04-08 IMAGING — MR MRI LUMBAR SPINE WITHOUT AND WITH CONTRAST
4 of 8 series · 12 of 48 positions shown · IV contrast (9ml Gadavist)
Comparison: Lumbar MRI 11/10/2012, lumbar spine radiographs
02/03/2019

CLINICAL DATA: Lumbar spinal stenosis. Lumbar fusion. Bilateral leg
pain.

EXAM:
MRI LUMBAR SPINE WITHOUT AND WITH CONTRAST
TECHNIQUE: Multiplanar and multiecho pulse sequences of the lumbar spine were
obtained without and with intravenous contrast.
CONTRAST:  9 mL Gadovist IV

[Series 3: T1 · sagittal · 4.0mm · 0.39mm/px · 3 of 17 slices shown (1 of 2)]
[im 1/17]
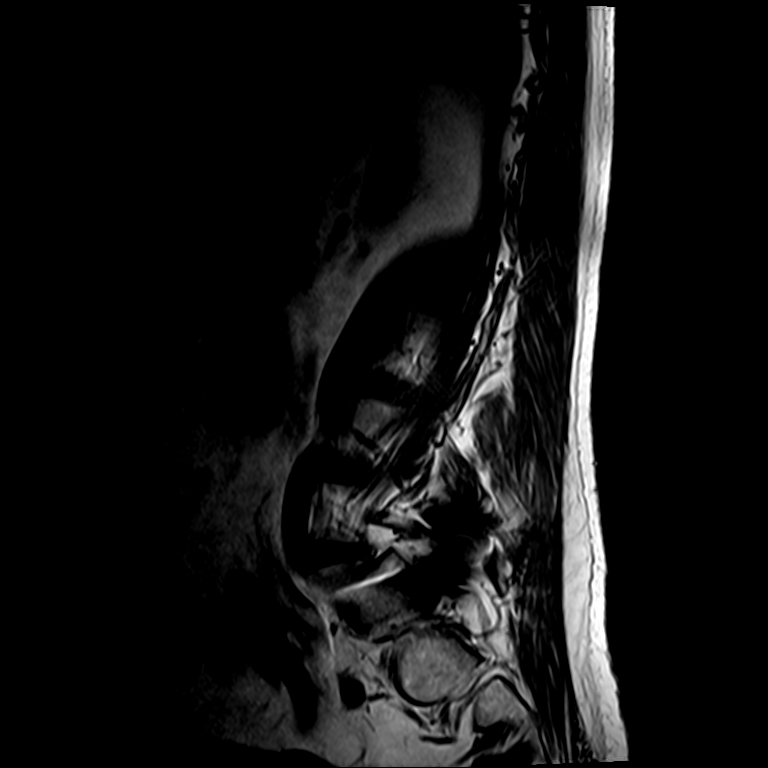
[im 9/17]
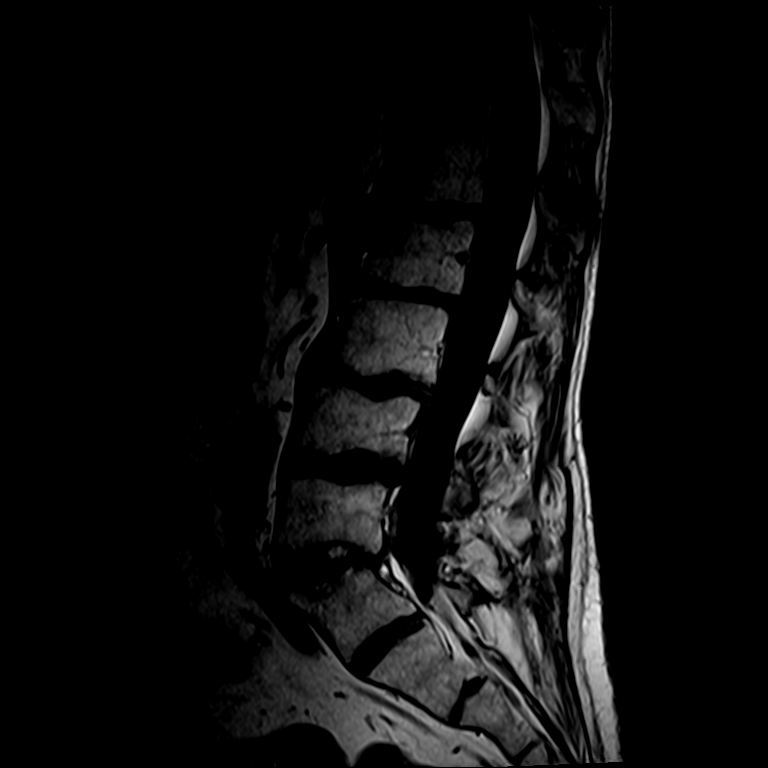
[im 17/17]
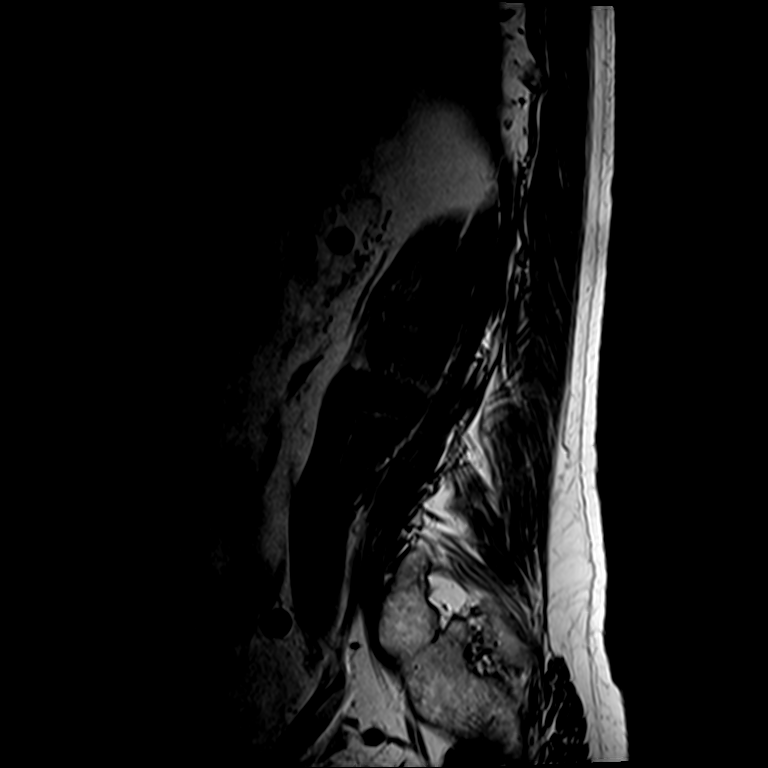

[Series 5: T2 · axial · 4.0mm · 0.27mm/px · z∈[-107,+53]mm · 3 of 48 slices shown (1 of 2)]
[im 6/48]
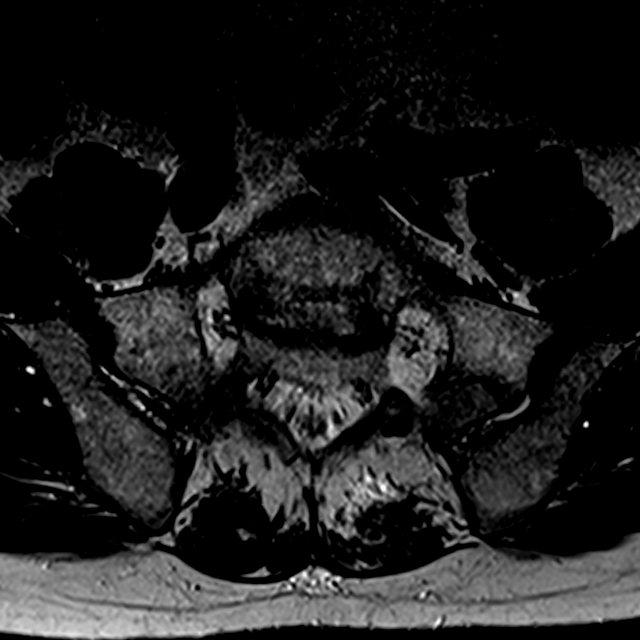
[im 27/48]
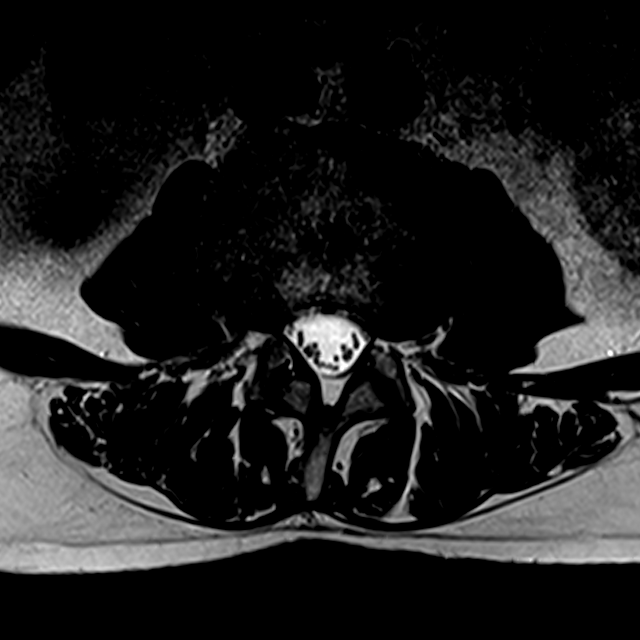
[im 42/48]
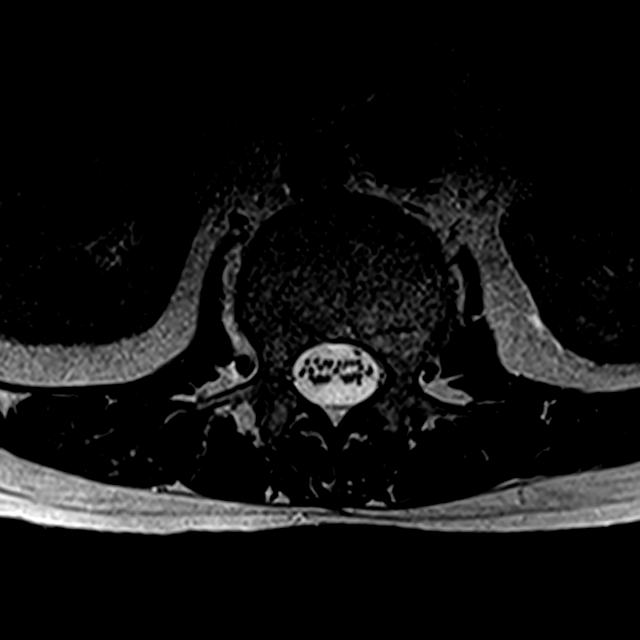

[Series 6: T1 · axial · 4.0mm · 0.27mm/px · z∈[-107,+53]mm · 3 of 48 slices shown (2 of 2)]
[im 6/48]
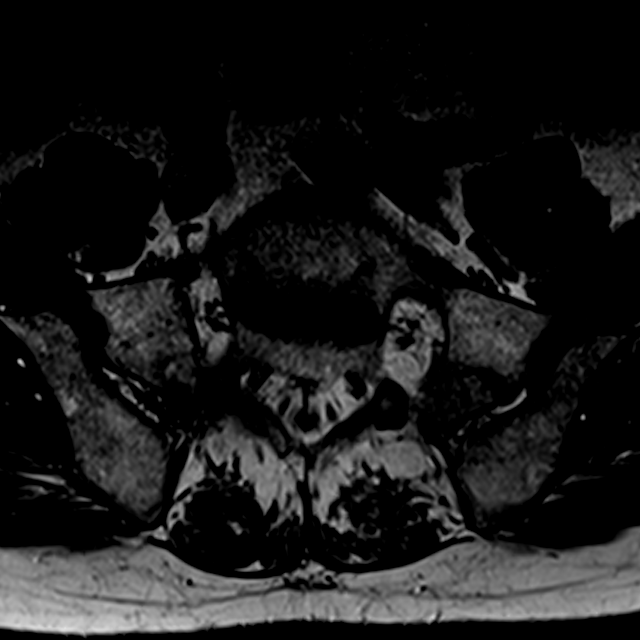
[im 27/48]
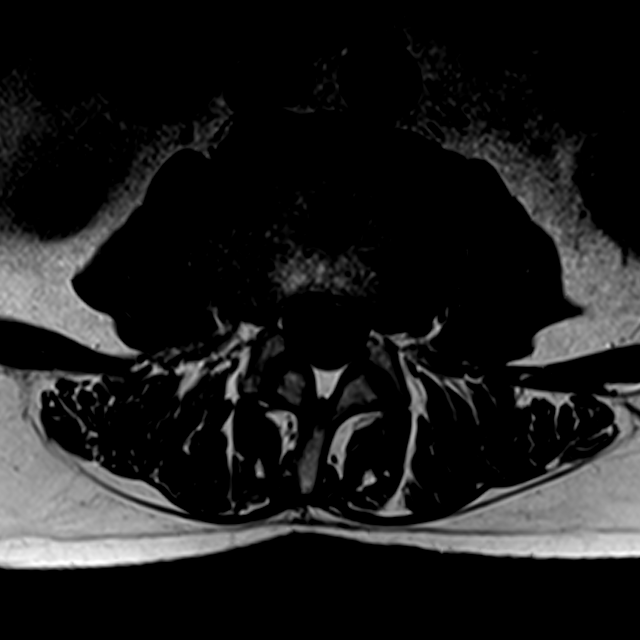
[im 42/48]
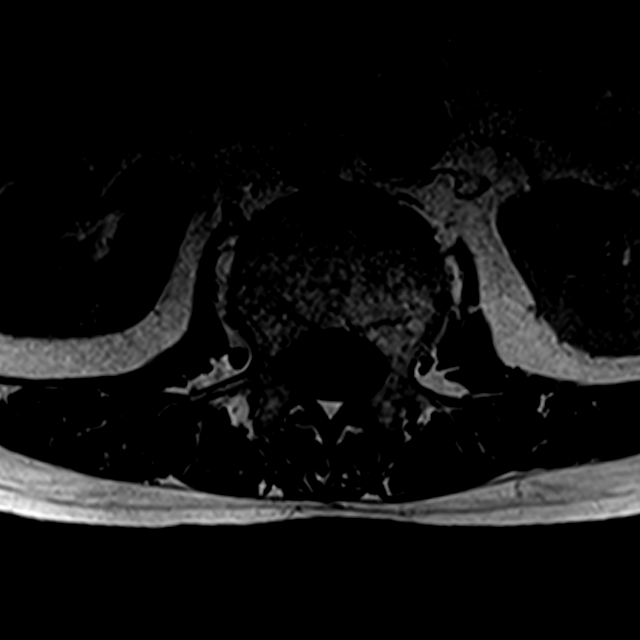

[Series 7: T2 · sagittal · 4.0mm · 0.59mm/px · 3 of 17 slices shown (2 of 2)]
[im 1/17]
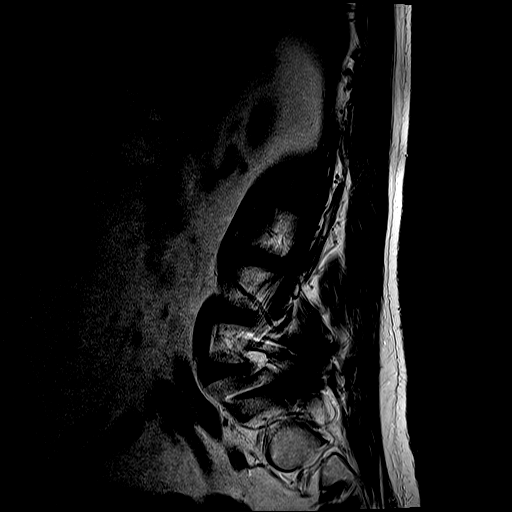
[im 11/17]
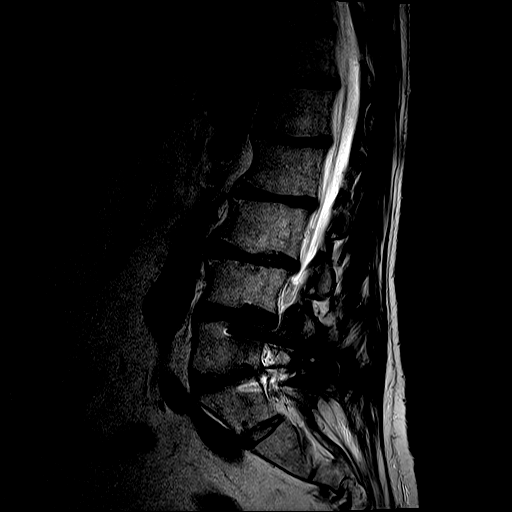
[im 17/17]
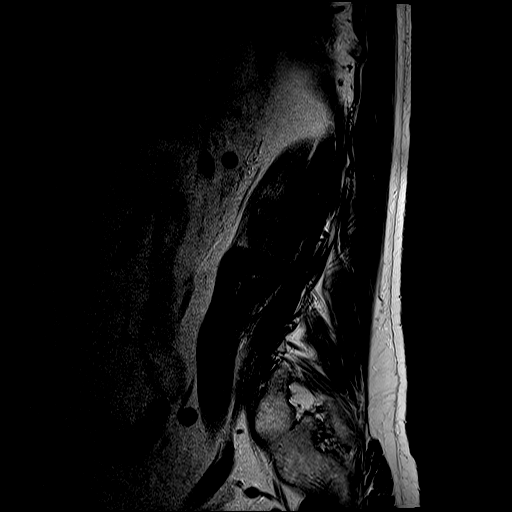

[12 of 48 positions shown; findings below may reference images not displayed]

FINDINGS: Segmentation: Normal. S1 is partially lumbarized. Prior fusion at
L5-S1 as described on the prior report.

Alignment: Mild retrolisthesis L1-2, L2-3, L3-4 unchanged. 5 mm
retrolisthesis L4-5 unchanged. 5 mm retrolisthesis L5-S1 unchanged.

Vertebrae:  Negative for fracture or mass.

Conus medullaris and cauda equina: Conus extends to the L1-2 level.
Conus and cauda equina appear normal.

Paraspinal and other soft tissues: Negative for paraspinous mass or
adenopathy. Mild aneurysm distal abdominal aorta measuring 26 x 29
mm.

Disc levels:

T12-L1: Small central disc protrusion unchanged

L1-2: Disc degeneration with disc bulging.  Negative for stenosis

L2-3: Disc degeneration and disc bulging without significant
stenosis.

L3-4: Disc degeneration with disc bulging and mild spurring.
Negative for stenosis. No interval change

L4-5: Retrolisthesis. Disc bulging and mild spurring without
significant stenosis. No interval change.

L5-S1: PLIF with posterior decompression.  Negative for stenosis.
IMPRESSION: No significant change since 1580

PLIF L5-S1 without significant stenosis

Multilevel degenerative change throughout the lumbar spine similar
to the prior study.

Small abdominal aortic aneurysm 29 x 26 mm.

Ectatic abdominal aorta at risk for aneurysm development. Recommend
followup by ultrasound in 5 years. This recommendation follows ACR
consensus guidelines: White Paper of the ACR Incidental Findings
Committee II on Vascular Findings. [HOSPITAL] 1741;

Aortic aneurysm NOS ([id number])

## 2021-05-26 ENCOUNTER — Other Ambulatory Visit: Payer: Self-pay

## 2021-05-26 ENCOUNTER — Emergency Department (HOSPITAL_COMMUNITY)
Admission: EM | Admit: 2021-05-26 | Discharge: 2021-05-26 | Disposition: A | Discharge status: Home / Self Care | Payer: Medicare HMO | Attending: Emergency Medicine | Admitting: Emergency Medicine

## 2021-05-26 ENCOUNTER — Encounter (HOSPITAL_COMMUNITY): Payer: Self-pay | Admitting: *Deleted

## 2021-05-26 DIAGNOSIS — F17210 Nicotine dependence, cigarettes, uncomplicated: Secondary | ICD-10-CM | POA: Diagnosis not present

## 2021-05-26 DIAGNOSIS — M545 Low back pain, unspecified: Secondary | ICD-10-CM | POA: Insufficient documentation

## 2021-05-26 DIAGNOSIS — J45901 Unspecified asthma with (acute) exacerbation: Secondary | ICD-10-CM | POA: Diagnosis not present

## 2021-05-26 DIAGNOSIS — G8929 Other chronic pain: Secondary | ICD-10-CM

## 2021-05-26 MED ORDER — OXYCODONE-ACETAMINOPHEN 5-325 MG PO TABS: 1.0000 | ORAL_TABLET | Freq: Four times a day (QID) | ORAL | 0 refills | Status: DC | PRN

## 2021-05-26 MED ORDER — HYDROCODONE-ACETAMINOPHEN 5-325 MG PO TABS
1.0000 | ORAL_TABLET | Freq: Once | ORAL | Status: AC
  Administered 2021-05-26: 1 via ORAL
  Filled 2021-05-26: qty 1

## 2021-05-26 NOTE — ED Triage Notes (Signed)
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

## 2021-05-26 NOTE — Discharge Instructions (Signed)
Take Percocet every 6 hours as needed for the pain. Continue taking the meloxicam and Tylenol for breakthrough pain as prescribed. Follow-up with your primary care doctor next week as discussed.  Return if worse

## 2021-05-26 NOTE — ED Provider Notes (Signed)
[facility] EMERGENCY DEPARTMENT Provider Note   CSN: [id number] Arrival date & time: 05/26/21  1044     History Chief Complaint  Patient presents with   Back Pain    [name] is a 50 y.o. male.  HPI  Patient with history of chronic back pain and multiple spinal surgeries presents with low back pain.  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  The pain is been terrible since then.  He has tried meloxicam, oxycodone, Tylenol, Aleve without any relief.  He is out of his oxycodone medicine his primary care has retired.  He does have an appointment to establish care with a new provider in 1 week.  He is not having any fevers, chills, urinary retention, saddle anesthesia, bilateral leg numbness or tingling.  No history of cancer, no trauma  Past Medical History:  Diagnosis Date   Arthritis    Chronic back pain    History of degenerative disc disease    History of spinal fusion     Patient Active Problem List   Diagnosis Date Noted   Abdominal aortic aneurysm (AAA) without rupture (HCC) 03/11/2019   Calculus of gallbladder without cholecystitis without obstruction    Closed displaced fracture of fourth metacarpal bone of right hand, initial encounter  07/29/17 08/29/2017   Radicular pain in right arm 04/22/2016   Cervical radiculopathy 04/20/2016   Cervical spondylosis 04/20/2016   Hyperlipidemia 10/16/2013   Allergic rhinitis 01/09/2013   Chronic low back pain 12/17/2012    Past Surgical History:  Procedure Laterality Date   AMPUTATION Left 04/28/2014   Procedure: 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;  Surgeon: Dallas Schimke, DPM;  Location: [facility];  Service: Podiatry;  Laterality: Left;   CHOLECYSTECTOMY N/A 06/13/2018   Procedure: LAPAROSCOPIC CHOLECYSTECTOMY;  Surgeon: Franky Macho, MD;  Location: [facility];  Service: General;  Laterality: N/A;   SPINAL FUSION         Family History  Problem  Relation Age of Onset   Healthy Mother    Aneurysm Father     Social History   Tobacco Use   Smoking status: Every Day    Packs/day: 1.00    Types: Cigarettes   Smokeless tobacco: Never  Vaping Use   Vaping Use: Never used  Substance Use Topics   Alcohol use: No   Drug use: No    Home Medications Prior to Admission medications   Medication Sig Start Date End Date Taking? Authorizing Provider  albuterol (PROVENTIL HFA;VENTOLIN HFA) 108 (90 Base) MCG/ACT inhaler Inhale 2 puffs into the lungs every 6 (six) hours as needed for wheezing. Patient not taking: Reported on 05/27/2019 09/12/18   Babs Sciara, MD  amoxicillin-clavulanate (AUGMENTIN) 875-125 MG tablet Take 1 tablet by mouth every 12 (twelve) hours. 12/31/19   Lorelee New, PA-C  baclofen (LIORESAL) 10 MG tablet Take 1 tablet (10 mg total) by mouth 3 (three) times daily. 10/18/19   Arthor Captain, PA-C  celecoxib (CELEBREX) 200 MG capsule Take 1 capsule (200 mg total) by mouth 2 (two) times daily. 10/18/19   Arthor Captain, PA-C  cholestyramine Lanetta Inch) 4 GM/DOSE powder 1 scoop Twice daily with meals mix with water or juice prn diarrhea Patient not taking: Reported on 09/30/2019 05/27/19   Babs Sciara, MD  methocarbamol (ROBAXIN) 500 MG tablet Take 1 tablet (500 mg total) by mouth 2 (two) times daily. 12/03/19   Bethel Born, PA-C  methylPREDNISolone (MEDROL DOSEPAK) 4 MG TBPK tablet Use as directed 10/18/19   Arthor Captain, PA-C  ondansetron (ZOFRAN ODT) 4 MG disintegrating tablet 4mg  ODT q4 hours prn nausea/vomit 02/05/21   02/07/21, MD  oxyCODONE-acetaminophen (PERCOCET) 5-325 MG tablet Take 1 tablet by mouth every 6 (six) hours as needed. 02/05/21   02/07/21, MD  penicillin v potassium (VEETID) 500 MG tablet Take 1 tablet (500 mg total) by mouth 4 (four) times daily. 10/07/19   10/09/19, MD  dicyclomine (BENTYL) 20 MG tablet Take 1 tablet (20 mg total) by mouth 2 (two) times daily. Patient not  taking: Reported on 10/07/2018 06/05/18 04/03/19  04/05/19, MD    Allergies    Lyrica [pregabalin] and Toradol [ketorolac tromethamine]  Review of Systems   Review of Systems  Constitutional:  Negative for fatigue and fever.  Cardiovascular:  Negative for leg swelling.  Gastrointestinal:  Negative for vomiting.  Genitourinary:  Negative for difficulty urinating and dysuria.  Musculoskeletal:  Positive for back pain.  Neurological:  Negative for dizziness, syncope and weakness.   Physical Exam Updated Vital Signs BP (!) 142/90 (BP Location: Right Arm)   Pulse 74   Temp 97.7 F (36.5 C) (Oral)   Resp 18   Ht 6\' 1"  (1.854 m)   Wt 92.5 kg   SpO2 94%   BMI 26.91 kg/m   Physical Exam Vitals and nursing note reviewed. Exam conducted with a chaperone present.  Constitutional:      General: He is not in acute distress.    Appearance: Normal appearance.  HENT:     Head: Normocephalic and atraumatic.  Eyes:     General: No scleral icterus.    Extraocular Movements: Extraocular movements intact.     Pupils: Pupils are equal, round, and reactive to light.  Cardiovascular:     Rate and Rhythm: Normal rate and regular rhythm.     Pulses: Normal pulses.  Musculoskeletal:        General: Tenderness present. No swelling or signs of injury. Normal range of motion.     Cervical back: Normal range of motion.     Right lower leg: No edema.     Left lower leg: No edema.     Comments: Some tenderness to the lumbar spine, otherwise negative  Skin:    Coloration: Skin is not jaundiced.  Neurological:     Mental Status: He is alert. Mental status is at baseline.     Coordination: Coordination normal.     Comments: Cranial nerves III through XII are grossly intact.  Grip strength equal bilaterally, patient is able to raise both legs without difficulty.  Sensation to light touch is grossly intact to the lower extremities.  Patient is ambulatory without assistance.    ED Results /  Procedures / Treatments   Labs (all labs ordered are listed, but only abnormal results are displayed) Labs Reviewed - No data to display  EKG None  Radiology No results found.  Procedures Procedures   Medications Ordered in ED Medications - No data to display  ED Course  I have reviewed the triage vital signs and the nursing notes.  Pertinent labs & imaging results that were available during my care of the patient were reviewed by me and considered in my medical decision making (see chart for details).    MDM Rules/Calculators/A&P  Patient vitals are stable, is not febrile.  He has no focal deficits on exam, states that the back pain is very consistent with his typical chronic back pain and feels like an exacerbation from physical exertion and on Monday.  Denies any recent trauma to the back, is not having any red flag symptoms concerning for cauda equina.  No fever or history of IV drug use concerning for cauda equina syndrome.  He is ambulatory although does elicit a little bit of pain.  Overall, do not think any additional imaging would be warranted at this time.  I will refill his pain prescription until he is able to follow-up with his new PCP next week.  Advised that if things change or worsen he is to return back to the ER.  Final Clinical Impression(s) / ED Diagnoses Final diagnoses:  None    Rx / DC Orders ED Discharge Orders     None        Theron Arista, [place] 05/26/21 1616    Bethann Berkshire, MD 05/27/21 1317

## 2021-05-28 ENCOUNTER — Emergency Department (HOSPITAL_COMMUNITY)
Admission: EM | Admit: 2021-05-28 | Discharge: 2021-05-28 | Disposition: A | Discharge status: Home / Self Care | Payer: Medicare HMO | Attending: Emergency Medicine | Admitting: Emergency Medicine

## 2021-05-28 ENCOUNTER — Other Ambulatory Visit: Payer: Self-pay

## 2021-05-28 ENCOUNTER — Encounter (HOSPITAL_COMMUNITY): Payer: Self-pay | Admitting: Emergency Medicine

## 2021-05-28 DIAGNOSIS — F17210 Nicotine dependence, cigarettes, uncomplicated: Secondary | ICD-10-CM | POA: Diagnosis not present

## 2021-05-28 DIAGNOSIS — M62830 Muscle spasm of back: Secondary | ICD-10-CM | POA: Diagnosis not present

## 2021-05-28 DIAGNOSIS — G8929 Other chronic pain: Secondary | ICD-10-CM | POA: Diagnosis not present

## 2021-05-28 DIAGNOSIS — M545 Low back pain, unspecified: Secondary | ICD-10-CM

## 2021-05-28 MED ORDER — MELOXICAM 7.5 MG PO TABS: 7.5000 mg | ORAL_TABLET | Freq: Every day | ORAL | 0 refills | Status: AC

## 2021-05-28 MED ORDER — PREDNISONE 20 MG PO TABS: 40.0000 mg | ORAL_TABLET | Freq: Every day | ORAL | 0 refills | Status: AC

## 2021-05-28 NOTE — ED Triage Notes (Addendum)
Pt c/o bilateral lower back pain that radiates down both legs. States he was seen here on Friday, but pain has not gotten any better. Pt reports some relief with prescribed medications. Hx of spinal fusion.

## 2021-05-28 NOTE — Discharge Instructions (Signed)
You were seen in the emergency department today for low back pain.  We had a long discussion about managing your pain until you are able to see your primary care provider.  You state you have an appointment this Friday on 06/02/2021.  Please make sure that you are able to get to this appointment.  In the meantime, we have prescribed you prednisone.  This is a steroid medication.  Would like you to take 40 mg daily for 5 days.  This should reduce inflammation that is likely causing you pain.  Additionally I have represcribed your meloxicam.  He said he were prescribed this but never filled it.  Please use this medication which is an anti-inflammatory medication to reduce your back pain.  You also have lidocaine patches at home.  You noted he did not need a refill of this prescription.  Please use these on your back to help with pain relief.  These return to the emergency department for any reason including worsening pain, fevers, redness or swelling of your back, inability to control your bowel or bladder.

## 2021-05-28 NOTE — ED Provider Notes (Signed)
[facility] EMERGENCY DEPARTMENT Provider Note   CSN: [id number] Arrival date & time: 05/28/21  0946     History Chief Complaint  Patient presents with   Back Pain    [name] is a 50 y.o. male.  With past medical history of degenerative disc disease, arthritis, chronic back pain, spinal fusion who presents to the emergency department for low back pain.  Patient states that he has ongoing back pain that has progressed to bilateral lower extremity pain.  Describes the pain as chronic, sharp, intermittently 10/10.  States he was seen in the emergency department 2 days ago at [facility] for similar complaint.  States he has tried oxycodone which was prescribed to him by pain management, and at the emergency department 2 days ago however has been unable to sleep more than 2 to 4 hours a night.  Unable to find a comfortable position.  Denies saddle anesthesia, incontinence of bowel or bladder, bilateral leg numbness or tingling, history of cancer, trauma, fevers, IV drug use.  States that he usually sees Dr. Janna Arch who prescribes him oxycodone; however, states that he retired in July and is unable to prescribe him oxycodone.   Back Pain Associated symptoms: no fever       Past Medical History:  Diagnosis Date   Arthritis    Chronic back pain    History of degenerative disc disease    History of spinal fusion     Patient Active Problem List   Diagnosis Date Noted   Abdominal aortic aneurysm (AAA) without rupture (HCC) 03/11/2019   Calculus of gallbladder without cholecystitis without obstruction    Closed displaced fracture of fourth metacarpal bone of right hand, initial encounter  07/29/17 08/29/2017   Radicular pain in right arm 04/22/2016   Cervical radiculopathy 04/20/2016   Cervical spondylosis 04/20/2016   Hyperlipidemia 10/16/2013   Allergic rhinitis 01/09/2013   Chronic low back pain 12/17/2012    Past Surgical History:  Procedure Laterality Date    AMPUTATION Left 04/28/2014   Procedure: 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;  Surgeon: Dallas Schimke, DPM;  Location: [facility];  Service: Podiatry;  Laterality: Left;   CHOLECYSTECTOMY N/A 06/13/2018   Procedure: LAPAROSCOPIC CHOLECYSTECTOMY;  Surgeon: Franky Macho, MD;  Location: [facility];  Service: General;  Laterality: N/A;   SPINAL FUSION         Family History  Problem Relation Age of Onset   Healthy Mother    Aneurysm Father     Social History   Tobacco Use   Smoking status: Every Day    Packs/day: 1.00    Types: Cigarettes   Smokeless tobacco: Never  Vaping Use   Vaping Use: Never used  Substance Use Topics   Alcohol use: No   Drug use: No    Home Medications Prior to Admission medications   Medication Sig Start Date End Date Taking? Authorizing Provider  meloxicam (MOBIC) 7.5 MG tablet Take 1 tablet (7.5 mg total) by mouth daily for 5 days. 05/28/21 06/02/21 Yes Cristopher Peru, PA-C  predniSONE (DELTASONE) 20 MG tablet Take 2 tablets (40 mg total) by mouth daily for 5 days. 05/28/21 06/02/21 Yes Cristopher Peru, PA-C  albuterol (PROVENTIL HFA;VENTOLIN HFA) 108 (90 Base) MCG/ACT inhaler Inhale 2 puffs into the lungs every 6 (six) hours as needed for wheezing. Patient not taking: Reported on 05/27/2019 09/12/18   Babs Sciara, MD  amoxicillin-clavulanate (AUGMENTIN) 875-125 MG tablet Take 1 tablet by mouth every 12 (twelve)  hours. 12/31/19   Lorelee New, PA-C  baclofen (LIORESAL) 10 MG tablet Take 1 tablet (10 mg total) by mouth 3 (three) times daily. 10/18/19   Arthor Captain, PA-C  cholestyramine Lanetta Inch) 4 GM/DOSE powder 1 scoop Twice daily with meals mix with water or juice prn diarrhea Patient not taking: Reported on 09/30/2019 05/27/19   Babs Sciara, MD  methocarbamol (ROBAXIN) 500 MG tablet Take 1 tablet (500 mg total) by mouth 2 (two) times daily. 12/03/19   Bethel Born, PA-C  ondansetron (ZOFRAN ODT) 4 MG disintegrating tablet 4mg  ODT q4 hours prn  nausea/vomit 02/05/21   02/07/21, MD  oxyCODONE-acetaminophen (PERCOCET/ROXICET) 5-325 MG tablet Take 1 tablet by mouth every 6 (six) hours as needed for severe pain. 05/26/21   07/26/21, PA-C  penicillin v potassium (VEETID) 500 MG tablet Take 1 tablet (500 mg total) by mouth 4 (four) times daily. 10/07/19   10/09/19, MD  dicyclomine (BENTYL) 20 MG tablet Take 1 tablet (20 mg total) by mouth 2 (two) times daily. Patient not taking: Reported on 10/07/2018 06/05/18 04/03/19  04/05/19, MD    Allergies    Lyrica [pregabalin] and Toradol [ketorolac tromethamine]  Review of Systems   Review of Systems  Constitutional:  Negative for fever.  Genitourinary:  Negative for difficulty urinating.  Musculoskeletal:  Positive for back pain.  All other systems reviewed and are negative.  Physical Exam Updated Vital Signs BP 137/86 (BP Location: Right Arm)   Pulse 96   Temp 98 F (36.7 C) (Oral)   Resp 18   Ht 6' (1.829 m)   Wt 92.5 kg   SpO2 96%   BMI 27.67 kg/m   Physical Exam Vitals and nursing note reviewed.  Constitutional:      Appearance: Normal appearance. He is not toxic-appearing.  HENT:     Head: Normocephalic and atraumatic.  Eyes:     General: No scleral icterus. Pulmonary:     Effort: Pulmonary effort is normal. No respiratory distress.  Musculoskeletal:        General: Tenderness present. Normal range of motion.     Cervical back: Normal range of motion and neck supple. No tenderness.     Lumbar back: Spasms and tenderness present. No edema or signs of trauma.  Skin:    General: Skin is warm and dry.     Capillary Refill: Capillary refill takes less than 2 seconds.     Findings: No rash.  Neurological:     General: No focal deficit present.     Mental Status: He is alert and oriented to person, place, and time. Mental status is at baseline.     Cranial Nerves: No cranial nerve deficit.     Sensory: No sensory deficit.     Gait: Gait normal.   Psychiatric:        Mood and Affect: Mood normal.        Behavior: Behavior normal.        Thought Content: Thought content normal.        Judgment: Judgment normal.    ED Results / Procedures / Treatments   Labs (all labs ordered are listed, but only abnormal results are displayed) Labs Reviewed - No data to display  EKG None  Radiology No results found.  Procedures Procedures   Medications Ordered in ED Medications - No data to display  ED Course  I have reviewed the triage vital signs and the nursing notes.  Pertinent labs &  imaging results that were available during my care of the patient were reviewed by me and considered in my medical decision making (see chart for details).    MDM Rules/Calculators/A&P [name] is a 50 year old male with chronic low back pain. HPI in physical exam as described above.  His presentation is most consistent with lumbar go.  There are no back pain red flags on history and physical exam presentation is not consistent with malignancy.  I have no concern for new fracture as there is no trauma, no bony tenderness on palpation.  Low suspicion for cauda equina as he has had no bowel or urinary incontinence/retention, no saddle anesthesia, no distal weakness.  Low suspicion for epidural abscess as he does not have a IV drug use history, vertebral tenderness.  Given the clinical picture there is no indication for new imaging at this time.  Patient was seen 2 days ago and given prescription for oxycodone which he says he is already out of.  Additionally stated his doctor Dr. Janna Arch retired in July and was unable to renew his prescriptions; however, on review of his narcotic prescription history has been provided oxycodone 5 mg 120 tablets monthly including the month of August.  I spoken with the patient at bedside about potential courses of treatment.  He states that he was provided a prescription for meloxicam however he never filled this  prescription.  Discussed that a short course of prednisone may be helpful with inflammation at this time.  He is agreeable to the plan.  I will prescribe him prednisone 40 mg for 5 days and refilled his prescription of meloxicam 7.5 mg daily.  He states he has an appointment at the end of this week with his primary care physician.  I have encouraged him to attend this appointment for ongoing pain management care.  I discussed this case with my attending physician who cosigned this note including patient's presenting symptoms, physical exam, and planned diagnostics and interventions. Attending physician stated agreement with plan or made changes to plan which were implemented.   Final Clinical Impression(s) / ED Diagnoses Final diagnoses:  Chronic midline low back pain without sciatica    Rx / DC Orders ED Discharge Orders          Ordered    predniSONE (DELTASONE) 20 MG tablet  Daily        05/28/21 1026    meloxicam (MOBIC) 7.5 MG tablet  Daily        05/28/21 1026             Cristopher Peru, PA-C 05/28/21 2012    Eber Hong, MD 05/29/21 [phone]

## 2021-09-12 ENCOUNTER — Encounter: Payer: Self-pay | Admitting: *Deleted

## 2021-10-16 IMAGING — DX DG SHOULDER 2+V*L*
3 series · 3 of 3 positions shown · non-contrast
Comparison: August 19, 2019

CLINICAL DATA: Left shoulder ring for 2 days after lifting a heavy
box.

EXAM:
LEFT SHOULDER - 2+ VIEW

[shoulder grashey]
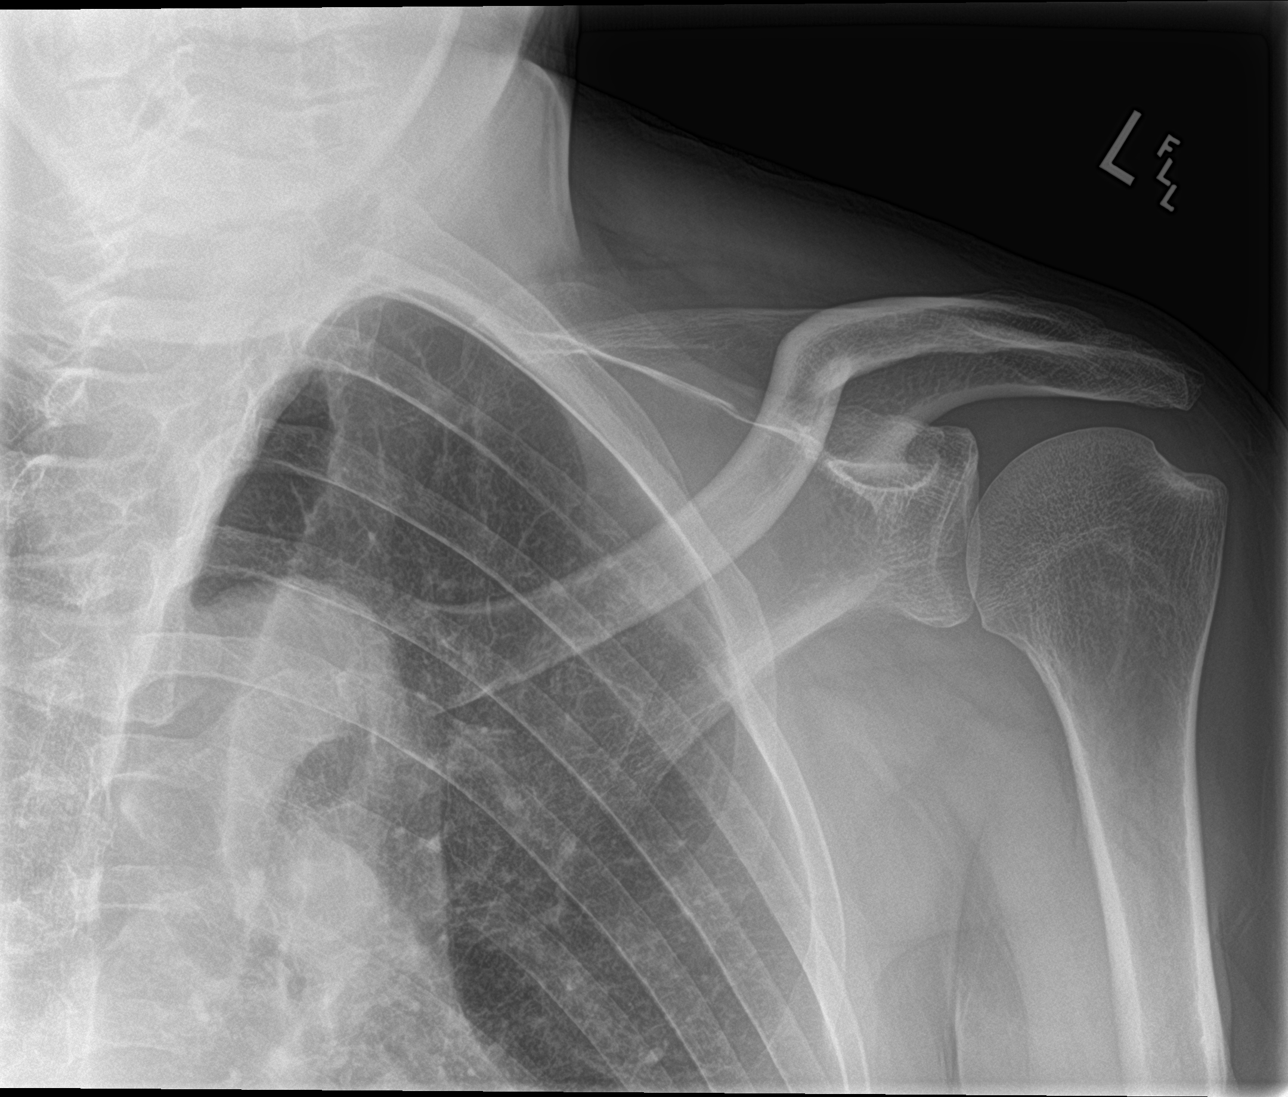

[shoulder y view]
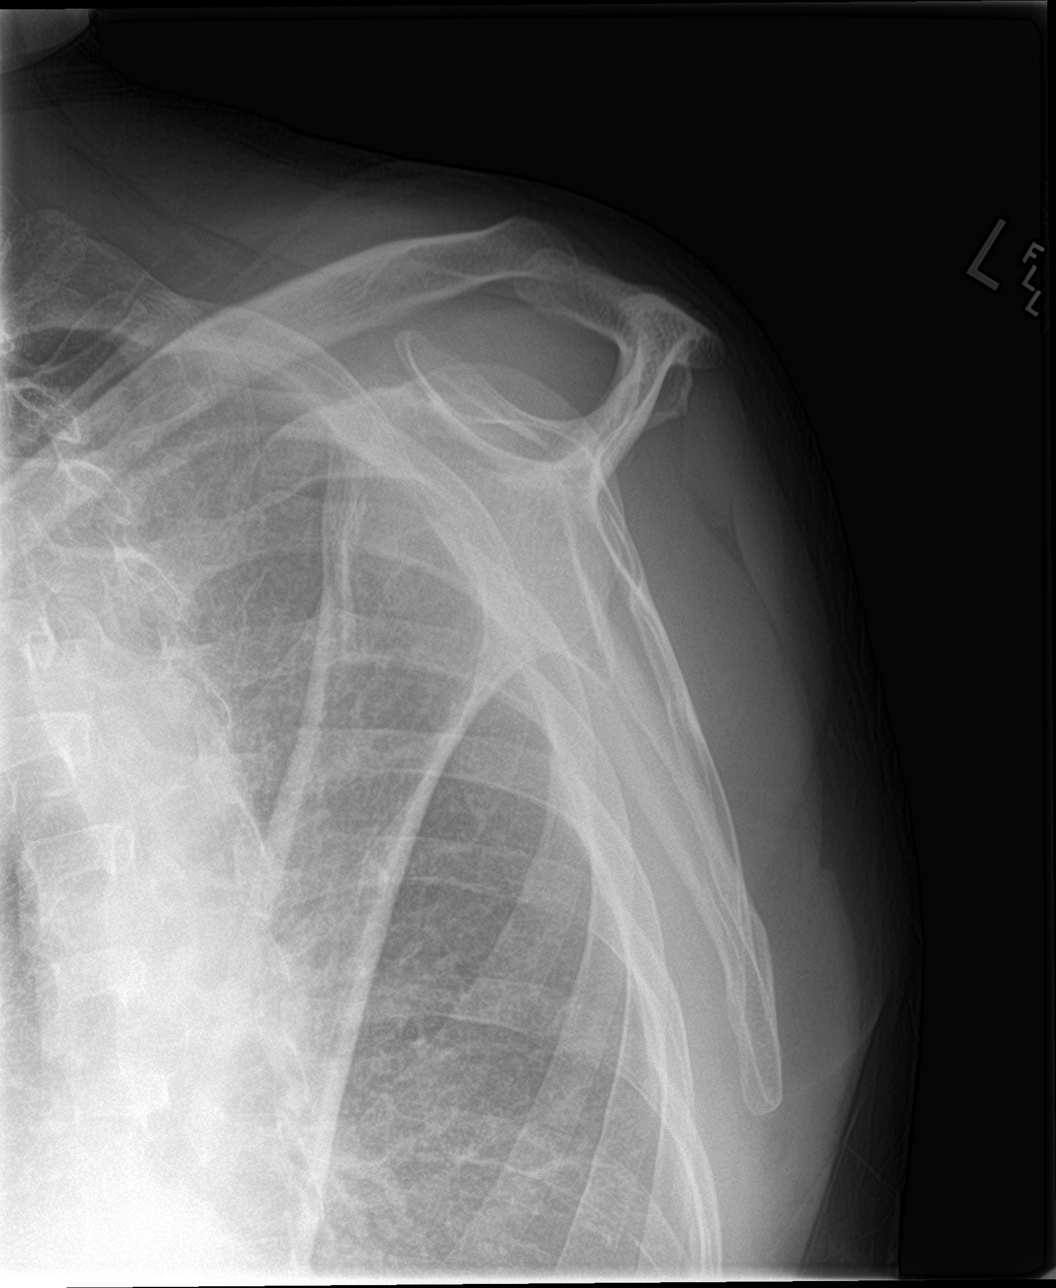

[shoulder axillary]
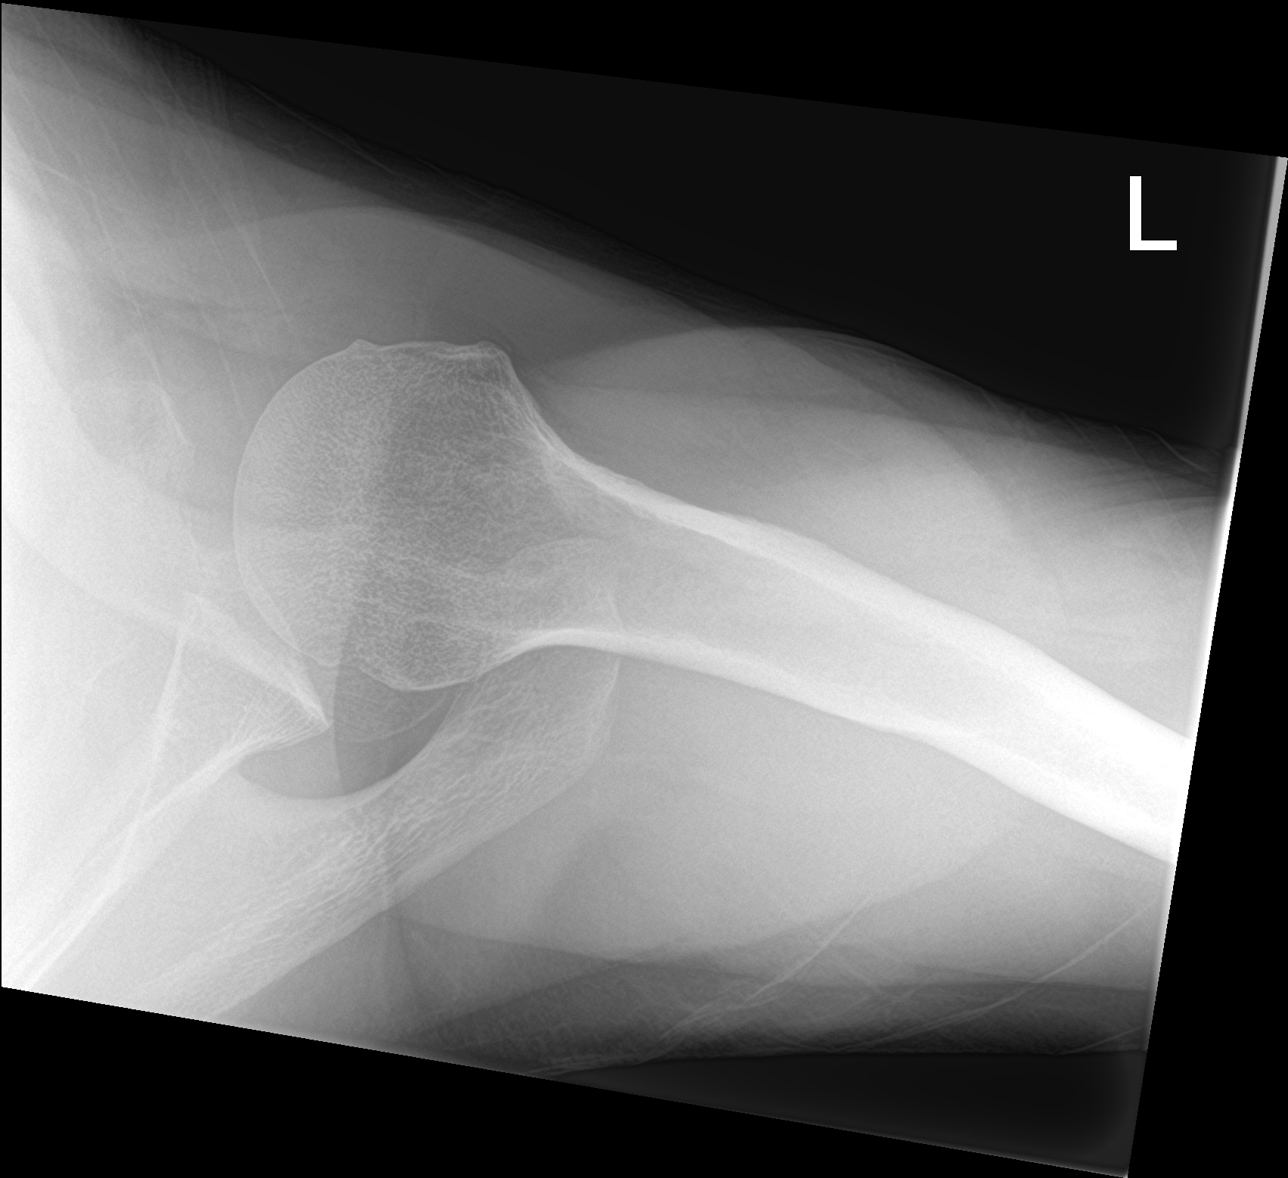

[3 of 3 positions shown; findings below may reference images not displayed]

FINDINGS: There is no evidence of fracture or dislocation. There is no
evidence of arthropathy or other focal bone abnormality. Soft
tissues are unremarkable.
IMPRESSION: Negative.

## 2021-11-02 ENCOUNTER — Ambulatory Visit (INDEPENDENT_AMBULATORY_CARE_PROVIDER_SITE_OTHER): Payer: Self-pay | Admitting: *Deleted

## 2021-11-02 ENCOUNTER — Other Ambulatory Visit: Payer: Self-pay

## 2021-11-02 VITALS — Ht 73.0 in | Wt 218.0 lb

## 2021-11-02 DIAGNOSIS — Z1211 Encounter for screening for malignant neoplasm of colon: Secondary | ICD-10-CM

## 2021-11-02 NOTE — Progress Notes (Signed)
Ok to schedule. ASA 2.  

## 2021-11-02 NOTE — Progress Notes (Signed)
Gastroenterology Pre-Procedure Review  Request Date: 11/02/2021 Requesting Physician: Colleen Can, AGNP, no previous TCS, family hx of colon cancer (uncle)  PATIENT REVIEW QUESTIONS: The patient responded to the following health history questions as indicated:    1. Diabetes Melitis: no 2. Joint replacements in the past 12 months: no 3. Major health problems in the past 3 months: no 4. Has an artificial valve or MVP: no 5. Has a defibrillator: no 6. Has been advised in past to take antibiotics in advance of a procedure like teeth cleaning: no 7. Family history of colon cancer: yes, uncle: age unknown 35. Alcohol Use: no 9. Illicit drug Use: no 10. History of sleep apnea: no 11. History of coronary artery or other vascular stents placed within the last 12 months: no 12. History of any prior anesthesia complications: no 13. Body mass index is 28.76 kg/m.    MEDICATIONS & ALLERGIES:    Patient reports the following regarding taking any blood thinners:   Plavix? no Aspirin? Yes, 81 mg daily Coumadin? no Brilinta? no Xarelto? no Eliquis? no Pradaxa? no Savaysa? no Effient? no  Patient confirms/reports the following medications:  Current Outpatient Medications  Medication Sig Dispense Refill   cyclobenzaprine (FLEXERIL) 10 MG tablet Take 10 mg by mouth 3 (three) times daily as needed.     oxyCODONE-acetaminophen (PERCOCET/ROXICET) 5-325 MG tablet Take 1 tablet by mouth every 6 (six) hours as needed for severe pain. 6 tablet 0   No current facility-administered medications for this visit.    Patient confirms/reports the following allergies:  Allergies  Allergen Reactions   Lyrica [Pregabalin]    Toradol [Ketorolac Tromethamine] Itching, Swelling and Rash    Pt states he can take ibuprofen (12/13/18)    No orders of the defined types were placed in this encounter.   AUTHORIZATION INFORMATION Primary Insurance: Orpah Clinton,  ID #: [id number],  Group #:  [id number] Pre-Cert / Berkley Harvey required: No, not required  Secondary Insurance: Medicaid Family Planning,  ID #: [id number] Pre-Cert / Berkley Harvey required: No, not required  SCHEDULE INFORMATION: Procedure has been scheduled as follows:  Date: , Time:   Location: [facility] with Dr. Marletta Lor  This Gastroenterology Pre-Precedure Review Form is being routed to the following provider(s): Tana Coast, PA-C

## 2021-11-06 NOTE — Progress Notes (Signed)
Lmom for pt to call me back. 

## 2021-11-07 ENCOUNTER — Encounter: Payer: Self-pay | Admitting: *Deleted

## 2021-11-07 NOTE — Progress Notes (Signed)
Mailed letter to pt

## 2022-06-10 ENCOUNTER — Emergency Department (HOSPITAL_COMMUNITY): Payer: Medicare HMO

## 2022-06-10 ENCOUNTER — Other Ambulatory Visit: Payer: Self-pay

## 2022-06-10 ENCOUNTER — Emergency Department (HOSPITAL_COMMUNITY)
Admission: EM | Admit: 2022-06-10 | Discharge: 2022-06-10 | Disposition: A | Discharge status: Home / Self Care | Payer: Medicare HMO | Attending: Emergency Medicine | Admitting: Emergency Medicine

## 2022-06-10 ENCOUNTER — Encounter (HOSPITAL_COMMUNITY): Payer: Self-pay

## 2022-06-10 DIAGNOSIS — M545 Low back pain, unspecified: Secondary | ICD-10-CM | POA: Diagnosis not present

## 2022-06-10 DIAGNOSIS — M549 Dorsalgia, unspecified: Secondary | ICD-10-CM

## 2022-06-10 MED ORDER — METHOCARBAMOL 1000 MG/10ML IJ SOLN
1000.0000 mg | Freq: Once | INTRAMUSCULAR | Status: AC
  Administered 2022-06-10: 1000 mg via INTRAMUSCULAR
  Filled 2022-06-10: qty 10

## 2022-06-10 MED ORDER — IBUPROFEN 800 MG PO TABS
800.0000 mg | ORAL_TABLET | Freq: Once | ORAL | Status: AC
  Administered 2022-06-10: 800 mg via ORAL
  Filled 2022-06-10: qty 1

## 2022-06-10 MED ORDER — IBUPROFEN 600 MG PO TABS: 600.0000 mg | ORAL_TABLET | Freq: Four times a day (QID) | ORAL | 0 refills | Status: AC | PRN

## 2022-06-10 MED ORDER — CYCLOBENZAPRINE HCL 10 MG PO TABS: 10.0000 mg | ORAL_TABLET | Freq: Two times a day (BID) | ORAL | 0 refills | Status: DC | PRN

## 2022-06-10 NOTE — ED Triage Notes (Signed)
Patient presents to ED via POV, c/o back pain. Reports he was moving a lot of furniture on last week and started to  have dull aching paini n lower back and the pain is progressively getting worse.

## 2022-06-10 NOTE — ED Provider Notes (Signed)
[facility] EMERGENCY DEPARTMENT Provider Note   CSN: [id number] Arrival date & time: 06/10/22  1101     History Chief Complaint  Patient presents with   Back Pain    [name] is a 51 y.o. male patient with history of spinal fusion back in 2006 who presents to the emergency department with diffuse back pain has been ongoing for 2 weeks.  Patient states that he lives in an apartment to get to his apartment he has to take 20 steps.  He states 2 weeks ago he did 6 loads of laundry and was carrying groceries up and down the stairs.  Laundry room is located on the bottom floor.  He states that he felt fine that day but woke up the following day with a lot of tightness and pain.  Pain has been worsening since then.  He denies any focal weakness or numbness to the upper or lower extremities.  Denies any bowel or bladder incontinence.  No urinary retention.  No saddle numbness.  No fever or chills.   Back Pain      Home Medications Prior to Admission medications   Medication Sig Start Date End Date Taking? Authorizing Provider  cyclobenzaprine (FLEXERIL) 10 MG tablet Take 1 tablet (10 mg total) by mouth 2 (two) times daily as needed for muscle spasms. 06/10/22  Yes Meredeth Ide, Azlee Monforte M, PA-C  ibuprofen (ADVIL) 600 MG tablet Take 1 tablet (600 mg total) by mouth every 6 (six) hours as needed. 06/10/22  Yes Meredeth Ide, Braedyn Kauk M, PA-C  acetaminophen (TYLENOL) 500 MG tablet Take 500 mg by mouth as needed.    [provider]  aspirin EC 81 MG tablet Take 81 mg by mouth daily. Swallow whole.    [provider]  oxyCODONE-acetaminophen (PERCOCET/ROXICET) 5-325 MG tablet Take 1 tablet by mouth every 6 (six) hours as needed for severe pain. 05/26/21   Theron Arista, PA-C  dicyclomine (BENTYL) 20 MG tablet Take 1 tablet (20 mg total) by mouth 2 (two) times daily. Patient not taking: Reported on 10/07/2018 06/05/18 04/03/19  Franky Macho, MD      Allergies    Lyrica [pregabalin] and  Toradol [ketorolac tromethamine]    Review of Systems   Review of Systems  Musculoskeletal:  Positive for back pain.  All other systems reviewed and are negative.   Physical Exam Updated Vital Signs BP 128/88   Pulse 82   Temp 97.8 F (36.6 C) (Oral)   Resp 20   Ht 6\' 1"  (1.854 m)   Wt 98.9 kg   SpO2 94%   BMI 28.77 kg/m  Physical Exam Vitals and nursing note reviewed.  Constitutional:      Appearance: Normal appearance.  HENT:     Head: Normocephalic and atraumatic.  Eyes:     General:        Right eye: No discharge.        Left eye: No discharge.     Conjunctiva/sclera: Conjunctivae normal.  Pulmonary:     Effort: Pulmonary effort is normal.  Musculoskeletal:     Comments: There is point tenderness along the parathoracic and paralumbar musculature.  There is palpable spasm as well.  There is mild tenderness to the lower thoracic spine and lumbar spine in the midline.  5/5 strength to the upper and lower extremities.  Normal sensation to the upper and lower extremities.  Skin:    General: Skin is warm and dry.     Findings: No rash.  Neurological:  General: No focal deficit present.     Mental Status: He is alert.  Psychiatric:        Mood and Affect: Mood normal.        Behavior: Behavior normal.     ED Results / Procedures / Treatments   Labs (all labs ordered are listed, but only abnormal results are displayed) Labs Reviewed - No data to display  EKG None  Radiology DG Thoracic Spine 2 View  Result Date: 06/10/2022 CLINICAL DATA:  Upper back pain. Progressive pain over the last week after moving furniture. EXAM: THORACIC SPINE 2 VIEWS COMPARISON:  Thoracic spine radiographs 08/19/2019 FINDINGS: Twelve rib-bearing thoracic type vertebral bodies are present. Vertebral body heights and alignment are normal and stable. No acute fractures are present. Atherosclerotic changes are present at the aortic arch. Visualized lung fields are clear. IMPRESSION: 1. No  acute abnormality or significant interval change. 2. Stable appearance of the thoracic spine. 3. Aortic atherosclerosis. Electronically Signed   By: San Morelle M.D.   On: 06/10/2022 12:06   DG Lumbar Spine Complete  Result Date: 06/10/2022 CLINICAL DATA:  Progressive back pain over the last week after moving furniture. EXAM: LUMBAR SPINE - COMPLETE 6 VIEW COMPARISON:  MRI of lumbar spine 03/06/2019. Lumbar spine radiographs 02/03/2019 FINDINGS: Five non rib-bearing lumbar type vertebral bodies are present. Solid fusion again noted at L5-S1. Retrolisthesis at L3-4 and L4-5 is stable. Vertebral body heights are maintained. No acute or healing fractures are present. Vacuum disc phenomenon again noted at all levels in the lumbar spine. Atherosclerotic calcifications are present in the aorta and branch vessels without aneurysm. IMPRESSION: 1. Solid fusion at L5-S1. 2. Stable degenerative changes. 3. No acute abnormality. Electronically Signed   By: San Morelle M.D.   On: 06/10/2022 12:05    Procedures Procedures   Medications Ordered in ED Medications  methocarbamol (ROBAXIN) injection 1,000 mg (1,000 mg Intramuscular Given 06/10/22 1216)  ibuprofen (ADVIL) tablet 800 mg (800 mg Oral Given 06/10/22 1200)    ED Course/ Medical Decision Making/ A&P Clinical Course as of 06/10/22 1319  Sun Jun 10, 2022  1316 DG Thoracic Spine 2 View Normal. I personally ordered and interpreted this study. I agree with the radiologist interpretation.  [CF]  0347 DG Lumbar Spine Complete There is evidence of a lumbar fusion.  I personally ordered interpreted the study.  I do agree with the radiologist interpretation. [CF]  4259 On reevaluation, patient is feeling better after ibuprofen and Robaxin.  We will plan to discharge him home with follow-up with his PCP or spine doctor. Patient amendable to this plan.  [CF]    Clinical Course User Index [CF] Hendricks Limes, PA-C                            Medical Decision Making [name] is a 51 y.o. male patient who presents to the emergency department today for further evaluation of upper and lower back pain.  Given the patient's history of spinal fusion and time course I will likely get imaging of the thoracic and lumbar spine.  I would low suspicion at this time for cauda equina, fracture dislocation, epidural abscess.  I will start off with some Robaxin in addition to 800 mg ibuprofen.  I will reassess after imaging results.  As highlighted in ED course, patient is feeling somewhat better after Robaxin and ibuprofen.  I will plan to discharge him home with ibuprofen  and Flexeril.  I will have him follow-up with his primary care doctor if he has 1 or spinal doctor.  Strict return precautions were discussed.  He is safe for discharge.  Amount and/or Complexity of Data Reviewed Radiology: ordered. Decision-making details documented in ED Course.  Risk Prescription drug management.   Final Clinical Impression(s) / ED Diagnoses Final diagnoses:  Acute bilateral back pain, unspecified back location    Rx / DC Orders ED Discharge Orders          Ordered    ibuprofen (ADVIL) 600 MG tablet  Every 6 hours PRN        06/10/22 1317    cyclobenzaprine (FLEXERIL) 10 MG tablet  2 times daily PRN        06/10/22 1317              Honor Loh [place], [place] 06/10/22 1319    Benjiman Core, MD 06/10/22 1446

## 2022-06-10 NOTE — Discharge Instructions (Signed)
Please take 600 mg ibuprofen every 6 hours as needed for pain.  Flexeril twice daily.  Please do not operate heavy machinery or mix with alcohol with the Flexeril as it can make you very drowsy.  I would like for you to schedule appoint with your spine doctor for further evaluation if this does not improve.  Please return to the emergency department for any worsening symptoms you might have.

## 2022-07-28 ENCOUNTER — Encounter (HOSPITAL_COMMUNITY): Payer: Self-pay | Admitting: Emergency Medicine

## 2022-07-28 ENCOUNTER — Emergency Department (HOSPITAL_COMMUNITY)
Admission: EM | Admit: 2022-07-28 | Discharge: 2022-07-28 | Disposition: A | Discharge status: Home / Self Care | Payer: Medicare HMO | Attending: Emergency Medicine | Admitting: Emergency Medicine

## 2022-07-28 ENCOUNTER — Emergency Department (HOSPITAL_COMMUNITY): Payer: Medicare HMO

## 2022-07-28 DIAGNOSIS — M5440 Lumbago with sciatica, unspecified side: Secondary | ICD-10-CM | POA: Diagnosis not present

## 2022-07-28 DIAGNOSIS — Z7982 Long term (current) use of aspirin: Secondary | ICD-10-CM | POA: Diagnosis not present

## 2022-07-28 DIAGNOSIS — M545 Low back pain, unspecified: Secondary | ICD-10-CM | POA: Diagnosis present

## 2022-07-28 MED ORDER — HYDROMORPHONE HCL 1 MG/ML IJ SOLN
1.0000 mg | Freq: Once | INTRAMUSCULAR | Status: AC
  Administered 2022-07-28: 1 mg via INTRAMUSCULAR
  Filled 2022-07-28: qty 1

## 2022-07-28 MED ORDER — CYCLOBENZAPRINE HCL 10 MG PO TABS: 10.0000 mg | ORAL_TABLET | Freq: Three times a day (TID) | ORAL | 1 refills | Status: DC | PRN

## 2022-07-28 MED ORDER — OXYCODONE-ACETAMINOPHEN 5-325 MG PO TABS: 1.0000 | ORAL_TABLET | Freq: Four times a day (QID) | ORAL | 0 refills | Status: AC | PRN

## 2022-07-28 NOTE — ED Provider Notes (Signed)
[facility] EMERGENCY DEPARTMENT Provider Note   CSN: [id number] Arrival date & time: 07/28/22  1946     History {Add pertinent medical, surgical, social history, OB history to HPI:1} Chief Complaint  Patient presents with   Sciatica    [name] is a 51 y.o. male.  Patient has a history of back surgery.  He complains of worsening back pain with radiating down both legs   Back Pain      Home Medications Prior to Admission medications   Medication Sig Start Date End Date Taking? Authorizing Provider  cyclobenzaprine (FLEXERIL) 10 MG tablet Take 1 tablet (10 mg total) by mouth 3 (three) times daily as needed for muscle spasms. 07/28/22  Yes Bethann Berkshire, MD  oxyCODONE (ROXICODONE) 15 MG immediate release tablet Take 15 mg by mouth 2 (two) times daily. 05/10/22  Yes [provider]  oxyCODONE-acetaminophen (PERCOCET/ROXICET) 5-325 MG tablet Take 1 tablet by mouth every 6 (six) hours as needed for severe pain. 07/28/22  Yes Bethann Berkshire, MD  acetaminophen (TYLENOL) 500 MG tablet Take 500 mg by mouth as needed.    [provider]  aspirin EC 81 MG tablet Take 81 mg by mouth daily. Swallow whole.    [provider]  ibuprofen (ADVIL) 600 MG tablet Take 1 tablet (600 mg total) by mouth every 6 (six) hours as needed. 06/10/22   Honor Loh M, PA-C  dicyclomine (BENTYL) 20 MG tablet Take 1 tablet (20 mg total) by mouth 2 (two) times daily. Patient not taking: Reported on 10/07/2018 06/05/18 04/03/19  Franky Macho, MD      Allergies    Lyrica [pregabalin] and Toradol [ketorolac tromethamine]    Review of Systems   Review of Systems  Musculoskeletal:  Positive for back pain.    Physical Exam Updated Vital Signs BP 120/89   Pulse 66   Temp 97.6 F (36.4 C) (Oral)   Resp 20   Ht 6\' 1"  (1.854 m)   Wt 98.9 kg   SpO2 94%   BMI 28.77 kg/m  Physical Exam  ED Results / Procedures / Treatments   Labs (all labs ordered are listed, but  only abnormal results are displayed) Labs Reviewed - No data to display  EKG None  Radiology DG Lumbar Spine Complete  Result Date: 07/28/2022 CLINICAL DATA:  Back pain EXAM: LUMBAR SPINE - COMPLETE 4+ VIEW COMPARISON:  06/10/2022 FINDINGS: L5-S1 posterior fusion. Unchanged grade 1 retrolisthesis at L3-4 and L4-5. Multilevel vertebral body height loss is unchanged. No acute fracture IMPRESSION: L5-S1 posterior fusion. Unchanged grade 1 retrolisthesis at L3-4 and L4-5. Electronically Signed   By: 06/12/2022 M.D.   On: 07/28/2022 21:45    Procedures Procedures  {Document cardiac monitor, telemetry assessment procedure when appropriate:1}  Medications Ordered in ED Medications  HYDROmorphone (DILAUDID) injection 1 mg (1 mg Intramuscular Given 07/28/22 2137)    ED Course/ Medical Decision Making/ A&P                           Medical Decision Making Amount and/or Complexity of Data Reviewed Radiology: ordered.  Risk Prescription drug management.   Patient with chronic back problems.  He will follow-up with neurosurgery and is given Percocets and Flexeril  {Document critical care time when appropriate:1} {Document review of labs and clinical decision tools ie heart score, Chads2Vasc2 etc:1}  {Document your independent review of radiology images, and any outside records:1} {Document your discussion with family members,  caretakers, and with consultants:1} {Document social determinants of health affecting pt's care:1} {Document your decision making why or why not admission, treatments were needed:1} Final Clinical Impression(s) / ED Diagnoses Final diagnoses:  Acute right-sided low back pain with sciatica, sciatica laterality unspecified    Rx / DC Orders ED Discharge Orders          Ordered    oxyCODONE-acetaminophen (PERCOCET/ROXICET) 5-325 MG tablet  Every 6 hours PRN        07/28/22 2225    cyclobenzaprine (FLEXERIL) 10 MG tablet  3 times daily PRN        07/28/22  2226

## 2022-07-28 NOTE — ED Notes (Signed)
Went over [employer]. Verbalized understanding. Pt reports relief of pain. Ambulatory to lobby.

## 2022-07-28 NOTE — ED Notes (Signed)
Ambulatory to tx room  

## 2022-07-28 NOTE — ED Triage Notes (Signed)
Pt arrives c/o left sided back pain from chronic sciatica. Pt states he was being seen by pain clinic in [place] but he doesn't like having to drive that far.

## 2022-07-28 NOTE — Discharge Instructions (Addendum)
Follow-up with Dr. Jake Samples or one of his associates in [place] for your back.  Or he can follow-up with a doctor in [place] if you prefer that

## 2022-08-17 ENCOUNTER — Other Ambulatory Visit (HOSPITAL_COMMUNITY): Payer: Self-pay | Admitting: Neurosurgery

## 2022-08-17 DIAGNOSIS — M5440 Lumbago with sciatica, unspecified side: Secondary | ICD-10-CM

## 2022-08-20 ENCOUNTER — Emergency Department (HOSPITAL_COMMUNITY)
Admission: EM | Admit: 2022-08-20 | Discharge: 2022-08-20 | Discharge status: Against Medical Advice | Payer: Medicare HMO

## 2022-08-21 ENCOUNTER — Emergency Department (HOSPITAL_COMMUNITY)
Admission: EM | Admit: 2022-08-21 | Discharge: 2022-08-21 | Disposition: A | Discharge status: Home / Self Care | Payer: Medicare HMO | Attending: Emergency Medicine | Admitting: Emergency Medicine

## 2022-08-21 ENCOUNTER — Encounter (HOSPITAL_COMMUNITY): Payer: Self-pay | Admitting: *Deleted

## 2022-08-21 ENCOUNTER — Other Ambulatory Visit: Payer: Self-pay

## 2022-08-21 DIAGNOSIS — M5442 Lumbago with sciatica, left side: Secondary | ICD-10-CM | POA: Insufficient documentation

## 2022-08-21 DIAGNOSIS — Z7982 Long term (current) use of aspirin: Secondary | ICD-10-CM | POA: Diagnosis not present

## 2022-08-21 DIAGNOSIS — G8929 Other chronic pain: Secondary | ICD-10-CM

## 2022-08-21 DIAGNOSIS — M545 Low back pain, unspecified: Secondary | ICD-10-CM | POA: Diagnosis present

## 2022-08-21 MED ORDER — CYCLOBENZAPRINE HCL 10 MG PO TABS: 10.0000 mg | ORAL_TABLET | Freq: Three times a day (TID) | ORAL | 0 refills | Status: DC | PRN

## 2022-08-21 NOTE — ED Provider Notes (Signed)
[facility] EMERGENCY DEPARTMENT Provider Note   CSN: [id number] Arrival date & time: 08/21/22  0301     History  Chief Complaint  Patient presents with   Back Pain    [name] is a 51 y.o. male.   Back Pain Patient with back pain.  Lower back.  Is had for the last couple weeks.  Does have chronic back pain.  Used to be in the pain clinic but states he stopped going because they wanted to put a spinal stimulator in and would not order an MRI.  Has had worsening back pain.  Seen in the ER and is followed with Dr. Wynetta Emery.  Dr. Wynetta Emery was ordered MRI that will be done in 17 days.  States the muscle relaxers had helped probably more than the pain medicine.  States steroids have been done in the past did not help. Does have pain radiate down the left leg.  No loss of bladder or bowel control.  No injection drug history.   Past Medical History:  Diagnosis Date   Arthritis    Chronic back pain    History of degenerative disc disease    History of spinal fusion     Home Medications Prior to Admission medications   Medication Sig Start Date End Date Taking? Authorizing Provider  acetaminophen (TYLENOL) 500 MG tablet Take 500 mg by mouth as needed.    [provider]  aspirin EC 81 MG tablet Take 81 mg by mouth daily. Swallow whole.    [provider]  cyclobenzaprine (FLEXERIL) 10 MG tablet Take 1 tablet (10 mg total) by mouth 3 (three) times daily as needed for muscle spasms. 08/21/22   Benjiman Core, MD  ibuprofen (ADVIL) 600 MG tablet Take 1 tablet (600 mg total) by mouth every 6 (six) hours as needed. 06/10/22   Teressa Lower, PA-C  oxyCODONE (ROXICODONE) 15 MG immediate release tablet Take 15 mg by mouth 2 (two) times daily. 05/10/22   [provider]  oxyCODONE-acetaminophen (PERCOCET/ROXICET) 5-325 MG tablet Take 1 tablet by mouth every 6 (six) hours as needed for severe pain. 07/28/22   Bethann Berkshire, MD  dicyclomine (BENTYL) 20 MG tablet Take  1 tablet (20 mg total) by mouth 2 (two) times daily. Patient not taking: Reported on 10/07/2018 06/05/18 04/03/19  Franky Macho, MD      Allergies    Lyrica [pregabalin] and Toradol [ketorolac tromethamine]    Review of Systems   Review of Systems  Musculoskeletal:  Positive for back pain.    Physical Exam Updated Vital Signs BP 112/86   Pulse 71   Temp 97.7 F (36.5 C) (Oral)   Resp 18   Ht 6\' 1"  (1.854 m)   Wt 98.9 kg   SpO2 94%   BMI 28.77 kg/m  Physical Exam Vitals and nursing note reviewed.  Musculoskeletal:     Comments: Lumbar tenderness.  No rash.  Good strength bilateral lower extremities.  Neurological:     Mental Status: He is alert and oriented to person, place, and time.     ED Results / Procedures / Treatments   Labs (all labs ordered are listed, but only abnormal results are displayed) Labs Reviewed - No data to display  EKG None  Radiology No results found.  Procedures Procedures    Medications Ordered in ED Medications - No data to display  ED Course/ Medical Decision Making/ A&P  Medical Decision Making Risk Prescription drug management.   Patient with acute on chronic back pain.  Seen in the ER recently for the same and is followed up with neurosurgery 4 days ago.  Plan for MRI.  States pain is hurting more.  Differential diagnosis is long includes mostly muscle skeletal disease.  Does not appear to need emergent surgery.  Has MRI scheduled.  Steroids have not helped recently.  States muscle relaxers helped and we will refill that.  However I do not think he is a great candidate for narcotic treatment at this time.  Had previously been in pain clinic and then left them recently.  Also just got narcotics to the ER and was not given narcotics a couple days ago at the neurosurgery.  Appears stable for discharge.        Final Clinical Impression(s) / ED Diagnoses Final diagnoses:  Chronic midline low back pain  with left-sided sciatica    Rx / DC Orders ED Discharge Orders          Ordered    cyclobenzaprine (FLEXERIL) 10 MG tablet  3 times daily PRN        08/21/22 0844              Benjiman Core, MD 08/21/22 [phone]

## 2022-08-21 NOTE — ED Triage Notes (Signed)
Pt c/o lower back pain; pt has been having the pain x 2 weeks

## 2022-09-07 ENCOUNTER — Ambulatory Visit (HOSPITAL_COMMUNITY): Payer: Medicare HMO

## 2022-09-07 ENCOUNTER — Encounter (HOSPITAL_COMMUNITY): Payer: Self-pay

## 2022-10-03 ENCOUNTER — Encounter: Payer: Self-pay | Admitting: *Deleted

## 2023-01-05 ENCOUNTER — Other Ambulatory Visit: Payer: Self-pay

## 2023-01-05 ENCOUNTER — Emergency Department (HOSPITAL_COMMUNITY)
Admission: EM | Admit: 2023-01-05 | Discharge: 2023-01-05 | Disposition: A | Discharge status: Home / Self Care | Payer: Medicare HMO | Attending: Emergency Medicine | Admitting: Emergency Medicine

## 2023-01-05 DIAGNOSIS — M436 Torticollis: Secondary | ICD-10-CM | POA: Insufficient documentation

## 2023-01-05 DIAGNOSIS — Z7982 Long term (current) use of aspirin: Secondary | ICD-10-CM | POA: Diagnosis not present

## 2023-01-05 DIAGNOSIS — M542 Cervicalgia: Secondary | ICD-10-CM | POA: Diagnosis present

## 2023-01-05 MED ORDER — METHOCARBAMOL 500 MG PO TABS: 500.0000 mg | ORAL_TABLET | Freq: Two times a day (BID) | ORAL | 0 refills | Status: DC

## 2023-01-05 MED ORDER — METHOCARBAMOL 500 MG PO TABS
500.0000 mg | ORAL_TABLET | Freq: Once | ORAL | Status: AC
  Administered 2023-01-05: 500 mg via ORAL
  Filled 2023-01-05: qty 1

## 2023-01-05 MED ORDER — LIDOCAINE 5 % EX PTCH
1.0000 | MEDICATED_PATCH | CUTANEOUS | Status: DC
  Administered 2023-01-05: 1 via TRANSDERMAL
  Filled 2023-01-05: qty 1

## 2023-01-05 NOTE — Discharge Instructions (Addendum)
Please perform the stretching exercises we discussed. Please try to get deep tissue massage in the affected area.  Warm compresses will be helpful. Adjunct medications have been prescribed, but they only work in conjunction with the stretching exercises we discussed.  Call the neurologist at the number provided for a follow-up appointment in 7 to 10 days.

## 2023-01-05 NOTE — ED Triage Notes (Signed)
Pt c/o neck pain, states its painful to turn his head to right side. Denies any injury.

## 2023-01-05 NOTE — ED Provider Notes (Signed)
[name] EMERGENCY DEPARTMENT AT [facility] Provider Note   CSN: [id number] Arrival date & time: 01/05/23  2026     History  Chief Complaint  Patient presents with   Torticollis    Render [name] is a 52 y.o. male.  HPI    52 year old patient comes in with chief complaint of neck strain. Patient states that he has had neck pain for the last 3 or 4 days.  His pain started after he got out of work 3 or 4 days ago.  The next morning he woke up, and he had increased pain which is only worsened over time.  He also has significant stiffness, and has worsening pain when he turns to the right side.  Patient denies any headaches.  He denies any numbness or tingling.  Home Medications Prior to Admission medications   Medication Sig Start Date End Date Taking? Authorizing Provider  methocarbamol (ROBAXIN) 500 MG tablet Take 1 tablet (500 mg total) by mouth 2 (two) times daily. 01/05/23  Yes Derwood Kaplan, MD  acetaminophen (TYLENOL) 500 MG tablet Take 500 mg by mouth as needed.    [provider]  aspirin EC 81 MG tablet Take 81 mg by mouth daily. Swallow whole.    [provider]  cyclobenzaprine (FLEXERIL) 10 MG tablet Take 1 tablet (10 mg total) by mouth 3 (three) times daily as needed for muscle spasms. 08/21/22   Benjiman Core, MD  ibuprofen (ADVIL) 600 MG tablet Take 1 tablet (600 mg total) by mouth every 6 (six) hours as needed. 06/10/22   Teressa Lower, PA-C  oxyCODONE (ROXICODONE) 15 MG immediate release tablet Take 15 mg by mouth 2 (two) times daily. 05/10/22   [provider]  oxyCODONE-acetaminophen (PERCOCET/ROXICET) 5-325 MG tablet Take 1 tablet by mouth every 6 (six) hours as needed for severe pain. 07/28/22   Bethann Berkshire, MD  dicyclomine (BENTYL) 20 MG tablet Take 1 tablet (20 mg total) by mouth 2 (two) times daily. Patient not taking: Reported on 10/07/2018 06/05/18 04/03/19  Franky Macho, MD      Allergies    Lyrica  [pregabalin] and Toradol [ketorolac tromethamine]    Review of Systems   Review of Systems  All other systems reviewed and are negative.   Physical Exam Updated Vital Signs BP (!) 139/90   Pulse 76   Temp 98.5 F (36.9 C)   Resp 16   Ht  (1.854 m)   Wt 85.3 kg   SpO2 94%   BMI 24.80 kg/m  Physical Exam Vitals and nursing note reviewed.  Constitutional:      Appearance: He is well-developed.  HENT:     Head: Atraumatic.  Cardiovascular:     Rate and Rhythm: Normal rate.  Pulmonary:     Effort: Pulmonary effort is normal.  Musculoskeletal:     Cervical back: Neck supple.     Comments: Patient's head is slightly tilted to the left side. He has reproducible tenderness with movement of the head on the right side.  On exam, patient has clear evidence of spasms over the base of his right lateral skull and also over the scalene region and sternocleidomastoid on the right side.  Skin:    General: Skin is warm.  Neurological:     Mental Status: He is alert and oriented to person, place, and time.     ED Results / Procedures / Treatments   Labs (all labs ordered are listed, but only abnormal results  are displayed) Labs Reviewed - No data to display  EKG None  Radiology No results found.  Procedures Procedures    Medications Ordered in ED Medications  lidocaine (LIDODERM) 5 % 1 patch (has no administration in time range)    ED Course/ Medical Decision Making/ A&P                             Medical Decision Making Risk Prescription drug management.   52 year old male comes in with chief complaint of neck pain.  No neurodeficits.  Pain has been present for 3 or 4 days now.  He has no significant past medical history.  Denies any headaches.  Differential diagnosis considered for this patient includes cervical strain, torticollis, severe muscle spasms. Patient has no fevers, chills, sweats, headaches and I does not appear that he has  meningitis. Patient's blood pressure is stable, vascular exam is normal, no neurodeficits doubt this is dissection.  He has Clear evidence of reproducible tenderness with manipulation of his neck muscles, and there is clear evidence of spasms.  He likely has cervical strain or torticollis.  Will treat with muscle relaxant.  Advised continued NSAID use, warm compresses, deep tissue massage and calling neurology if his symptoms do not get better.  Final Clinical Impression(s) / ED Diagnoses Final diagnoses:  Torticollis    Rx / DC Orders ED Discharge Orders          Ordered    methocarbamol (ROBAXIN) 500 MG tablet  2 times daily        01/05/23 2231              Derwood Kaplan, MD 01/05/23 2238

## 2023-02-20 ENCOUNTER — Emergency Department (HOSPITAL_COMMUNITY)
Admission: EM | Admit: 2023-02-20 | Discharge: 2023-02-20 | Disposition: A | Discharge status: Home / Self Care | Payer: Medicare HMO | Attending: Emergency Medicine | Admitting: Emergency Medicine

## 2023-02-20 ENCOUNTER — Other Ambulatory Visit: Payer: Self-pay

## 2023-02-20 ENCOUNTER — Encounter (HOSPITAL_COMMUNITY): Payer: Self-pay

## 2023-02-20 DIAGNOSIS — M5441 Lumbago with sciatica, right side: Secondary | ICD-10-CM | POA: Diagnosis not present

## 2023-02-20 DIAGNOSIS — G8929 Other chronic pain: Secondary | ICD-10-CM | POA: Diagnosis not present

## 2023-02-20 DIAGNOSIS — Z7982 Long term (current) use of aspirin: Secondary | ICD-10-CM | POA: Diagnosis not present

## 2023-02-20 DIAGNOSIS — M545 Low back pain, unspecified: Secondary | ICD-10-CM | POA: Diagnosis present

## 2023-02-20 MED ORDER — DIAZEPAM 2 MG PO TABS
2.0000 mg | ORAL_TABLET | Freq: Once | ORAL | Status: AC
  Administered 2023-02-20: 2 mg via ORAL
  Filled 2023-02-20: qty 1

## 2023-02-20 MED ORDER — LIDOCAINE 5 % EX PTCH: 1.0000 | MEDICATED_PATCH | CUTANEOUS | 0 refills | Status: AC

## 2023-02-20 MED ORDER — METHOCARBAMOL 500 MG PO TABS: 500.0000 mg | ORAL_TABLET | Freq: Two times a day (BID) | ORAL | 0 refills | Status: DC

## 2023-02-20 MED ORDER — HYDROCODONE-ACETAMINOPHEN 5-325 MG PO TABS
1.0000 | ORAL_TABLET | Freq: Once | ORAL | Status: AC
  Administered 2023-02-20: 1 via ORAL
  Filled 2023-02-20: qty 1

## 2023-02-20 MED ORDER — LIDOCAINE 5 % EX PTCH
1.0000 | MEDICATED_PATCH | CUTANEOUS | Status: DC
  Administered 2023-02-20: 1 via TRANSDERMAL
  Filled 2023-02-20: qty 1

## 2023-02-20 MED ORDER — ETODOLAC 400 MG PO TABS: 400.0000 mg | ORAL_TABLET | Freq: Two times a day (BID) | ORAL | 0 refills | Status: AC

## 2023-02-20 MED ORDER — IBUPROFEN 400 MG PO TABS
600.0000 mg | ORAL_TABLET | Freq: Once | ORAL | Status: AC
  Administered 2023-02-20: 600 mg via ORAL
  Filled 2023-02-20: qty 2

## 2023-02-20 NOTE — ED Triage Notes (Signed)
Pt arrived via POV c/o back pain X 4 days. Pt reports he is unsure how his pain was aggravated, but nothing has relieved the pain at home. Pt endorse lumbar pain that shoots down his left leg.

## 2023-02-20 NOTE — Discharge Instructions (Signed)
You had improvement in your pain after medications in the emergency department.  I have sent in Robaxin which is a muscle relaxer.  Do not drive or do anything else dangerous after taking this medication.  Have also sent in Lodine which is an anti-inflammatory.  Do not combine this with other anti-inflammatory such as ibuprofen, Aleve.  I have also sent lidocaine patch into the pharmacy.  You can apply 1 at a time.  For any concerning symptoms return to the emergency room otherwise follow-up with your primary care provider.

## 2023-02-20 NOTE — ED Provider Notes (Signed)
[place] EMERGENCY DEPARTMENT AT [facility] Provider Note   CSN: [id number] Arrival date & time: 02/20/23  2021     History  Chief Complaint  Patient presents with   Back Pain    [name] is a 52 y.o. male.  52 year old male with past medical history significant for longstanding low back pain presents with worsening low back pain over the past 4 days that radiates down right lower extremity.  Denies any recent injury.  Denies any bowel or bladder dysfunction.  No saddle anesthesia.  No fever.  No history of IV drug use.  He states he has tried over-the-counter medications without improvement.  He states typically when it gets this bad pain medication, muscle relaxant help.  The history is provided by the patient. No language interpreter was used.       Home Medications Prior to Admission medications   Medication Sig Start Date End Date Taking? Authorizing Provider  acetaminophen (TYLENOL) 500 MG tablet Take 500 mg by mouth as needed.    [provider]  aspirin EC 81 MG tablet Take 81 mg by mouth daily. Swallow whole.    [provider]  cyclobenzaprine (FLEXERIL) 10 MG tablet Take 1 tablet (10 mg total) by mouth 3 (three) times daily as needed for muscle spasms. 08/21/22   Benjiman Core, MD  ibuprofen (ADVIL) 600 MG tablet Take 1 tablet (600 mg total) by mouth every 6 (six) hours as needed. 06/10/22   Honor Loh M, PA-C  methocarbamol (ROBAXIN) 500 MG tablet Take 1 tablet (500 mg total) by mouth 2 (two) times daily. 01/05/23   Derwood Kaplan, MD  oxyCODONE (ROXICODONE) 15 MG immediate release tablet Take 15 mg by mouth 2 (two) times daily. 05/10/22   [provider]  oxyCODONE-acetaminophen (PERCOCET/ROXICET) 5-325 MG tablet Take 1 tablet by mouth every 6 (six) hours as needed for severe pain. 07/28/22   Bethann Berkshire, MD  dicyclomine (BENTYL) 20 MG tablet Take 1 tablet (20 mg total) by mouth 2 (two) times daily. Patient not  taking: Reported on 10/07/2018 06/05/18 04/03/19  Franky Macho, MD      Allergies    Lyrica [pregabalin] and Toradol [ketorolac tromethamine]    Review of Systems   Review of Systems  Constitutional:  Negative for chills and fever.  Genitourinary:  Negative for difficulty urinating.  Musculoskeletal:  Positive for back pain.  Neurological:  Negative for weakness.  All other systems reviewed and are negative.   Physical Exam Updated Vital Signs BP (!) 136/99 (BP Location: Right Arm)   Pulse (!) 58   Temp 98.2 F (36.8 C) (Oral)   Resp 18   Ht 6\' 1"  (1.854 m)   Wt 92.1 kg   SpO2 96%   BMI 26.78 kg/m  Physical Exam Vitals and nursing note reviewed.  Constitutional:      General: He is not in acute distress.    Appearance: Normal appearance. He is not ill-appearing.  HENT:     Head: Normocephalic and atraumatic.     Nose: Nose normal.  Eyes:     Conjunctiva/sclera: Conjunctivae normal.  Pulmonary:     Effort: Pulmonary effort is normal. No respiratory distress.  Musculoskeletal:        General: No deformity. Normal range of motion.     Comments: Range of motion in bilateral upper and lower extremities.  5/5 strength in bilateral lower extremities.  Lumbar spine without tenderness to palpation.  Straight leg raise test positive on  the right.  Patient is able ambulate without much difficulty.  Skin:    Findings: No rash.  Neurological:     Mental Status: He is alert.     ED Results / Procedures / Treatments   Labs (all labs ordered are listed, but only abnormal results are displayed) Labs Reviewed - No data to display  EKG None  Radiology No results found.  Procedures Procedures    Medications Ordered in ED Medications  lidocaine (LIDODERM) 5 % 1 patch (1 patch Transdermal Patch Applied 02/20/23 2215)  diazepam (VALIUM) tablet 2 mg (2 mg Oral Given 02/20/23 2214)  HYDROcodone-acetaminophen (NORCO/VICODIN) 5-325 MG per tablet 1 tablet (1 tablet Oral Given 02/20/23  2214)  ibuprofen (ADVIL) tablet 600 mg (600 mg Oral Given 02/20/23 2214)    ED Course/ Medical Decision Making/ A&P                             Medical Decision Making Risk Prescription drug management.   52 year old male presents today with low back pain with radiculopathy down right lower extremity.  Similar to his prior episodes of flareup of the sciatic nerve.  Will give dose of Valium, Norco, lidocaine patch in the emergency department.  Without red flag signs or symptoms concerning for cauda equina syndrome.  On reevaluation patient reports improvement.  Will prescribe Robaxin, lidocaine patch, and Lodine.  Return precautions discussed.  Patient voices understanding and is in agreement with plan.  He is appropriate for discharge.  Discharged in stable condition.   Final Clinical Impression(s) / ED Diagnoses Final diagnoses:  Chronic right-sided low back pain with right-sided sciatica    Rx / DC Orders ED Discharge Orders          Ordered    lidocaine (LIDODERM) 5 %  Every 24 hours        02/20/23 2301    etodolac (LODINE) 400 MG tablet  2 times daily       Note to Pharmacy: Patient able to tolerate this.  Toradol is the only anti-inflammatory he is allergic to.   02/20/23 2301    methocarbamol (ROBAXIN) 500 MG tablet  2 times daily        02/20/23 2301              Marita Kansas, PA-C 02/20/23 2301    Vanetta Mulders, MD 02/21/23 2119

## 2023-03-19 ENCOUNTER — Encounter: Payer: Self-pay | Admitting: *Deleted

## 2023-03-30 ENCOUNTER — Encounter (HOSPITAL_COMMUNITY): Payer: Self-pay

## 2023-03-30 ENCOUNTER — Emergency Department (HOSPITAL_COMMUNITY)
Admission: EM | Admit: 2023-03-30 | Discharge: 2023-03-31 | Disposition: A | Discharge status: Home / Self Care | Payer: Medicare HMO | Attending: Emergency Medicine | Admitting: Emergency Medicine

## 2023-03-30 ENCOUNTER — Emergency Department (HOSPITAL_COMMUNITY): Payer: Medicare HMO

## 2023-03-30 ENCOUNTER — Other Ambulatory Visit: Payer: Self-pay

## 2023-03-30 DIAGNOSIS — R109 Unspecified abdominal pain: Secondary | ICD-10-CM | POA: Insufficient documentation

## 2023-03-30 DIAGNOSIS — F17200 Nicotine dependence, unspecified, uncomplicated: Secondary | ICD-10-CM | POA: Insufficient documentation

## 2023-03-30 LAB — CBC
HCT: 44.2 % (ref 39.0–52.0)
Hemoglobin: 14.7 g/dL (ref 13.0–17.0)
MCH: 30.8 pg (ref 26.0–34.0)
MCHC: 33.3 g/dL (ref 30.0–36.0)
MCV: 92.7 fL (ref 80.0–100.0)
Platelets: 240 K/uL (ref 150–400)
RBC: 4.77 MIL/uL (ref 4.22–5.81)
RDW: 13.5 % (ref 11.5–15.5)
WBC: 11.6 K/uL — ABNORMAL HIGH (ref 4.0–10.5)
nRBC: 0 % (ref 0.0–0.2)

## 2023-03-30 LAB — COMPREHENSIVE METABOLIC PANEL WITH GFR
ALT: 22 U/L (ref 0–44)
AST: 19 U/L (ref 15–41)
Albumin: 4.1 g/dL (ref 3.5–5.0)
Alkaline Phosphatase: 66 U/L (ref 38–126)
Anion gap: 9 (ref 5–15)
BUN: 15 mg/dL (ref 6–20)
CO2: 25 mmol/L (ref 22–32)
Calcium: 9.1 mg/dL (ref 8.9–10.3)
Chloride: 104 mmol/L (ref 98–111)
Creatinine, Ser: 0.95 mg/dL (ref 0.61–1.24)
GFR, Estimated: >60 mL/min
Glucose, Bld: 103 mg/dL — ABNORMAL HIGH (ref 70–99)
Potassium: 3.9 mmol/L (ref 3.5–5.1)
Sodium: 138 mmol/L (ref 135–145)
Total Bilirubin: 0.6 mg/dL (ref 0.3–1.2)
Total Protein: 7.2 g/dL (ref 6.5–8.1)

## 2023-03-30 LAB — LIPASE, BLOOD: Lipase: 106 U/L — ABNORMAL HIGH (ref 11–51)

## 2023-03-30 MED ORDER — IOHEXOL 350 MG/ML SOLN
100.0000 mL | Freq: Once | INTRAVENOUS | Status: AC | PRN
  Administered 2023-03-31: 100 mL via INTRAVENOUS

## 2023-03-30 NOTE — ED Triage Notes (Signed)
Pt reports:  Abdomen pain Started 4 days ago Constant pain Hx of aortic aneurysm (2 years  ago)

## 2023-03-31 MED ORDER — SUCRALFATE 1 G PO TABS: 1.0000 g | ORAL_TABLET | Freq: Four times a day (QID) | ORAL | 0 refills | Status: AC | PRN

## 2023-03-31 MED ORDER — OMEPRAZOLE 20 MG PO CPDR: 20.0000 mg | DELAYED_RELEASE_CAPSULE | Freq: Every day | ORAL | 1 refills | Status: AC

## 2023-03-31 NOTE — Discharge Instructions (Addendum)
You were evaluated in the Emergency Department and after careful evaluation, we did not find any emergent condition requiring admission or further testing in the hospital.  Your exam/testing today is overall reassuring.  Recommend following up on your abdominal aortic aneurysm with your regular doctor.  Take the omeprazole daily to help prevent pain, use the Carafate medication as needed for immediate relief.  Please return to the Emergency Department if you experience any worsening of your condition.   Thank you for allowing [place] to be a part of your care.

## 2023-03-31 NOTE — ED Provider Notes (Signed)
[facility] Emergency Department Provider Note MRN:  [id number]  Arrival date & time: 03/31/23     Chief Complaint   Abdominal Pain   History of Present Illness   [name] is a 52 y.o. year-old male with a history of AAA presenting to the ED with chief complaint of abdominal pain.  Duration concerned about his abdominal aortic aneurysm.  Having pain just above the bellybutton.  Present for few days.  No fever.  No nausea vomiting or diarrhea.  Review of Systems  A thorough review of systems was obtained and all systems are negative except as noted in the HPI and PMH.   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;  Surgeon: Dallas Schimke, DPM;  Location: [facility] OR[facility];  Service: Podiatry;  Laterality: Left;   CHOLECYSTECTOMY N/A 06/13/2018   Procedure: LAPAROSCOPIC CHOLECYSTECTOMY;  Surgeon: Franky Macho, MD;  Location: [facility] ORS;  Service: General;  Laterality: N/A;   SPINAL FUSION      Family History  Problem Relation Age of Onset   Healthy Mother    Aneurysm Father     Social History   Socioeconomic History   Marital status: Legally Separated    Spouse name: Not on file   Number of children: Not on file   Years of education: Not on file   Highest education level: Not on file  Occupational History   Not on file  Tobacco Use   Smoking status: Every Day    Current packs/day: 1.00    Types: Cigarettes   Smokeless tobacco: Never  Vaping Use   Vaping status: Never Used  Substance and Sexual Activity   Alcohol use: No   Drug use: No   Sexual activity: Not on file  Other Topics Concern   Not on file  Social History Narrative   Not on file   Social Determinants of Health   Financial  Resource Strain: Not on file  Food Insecurity: Not on file  Transportation Needs: Not on file  Physical Activity: Not on file  Stress: Not on file  Social Connections: Not on file  Intimate Partner Violence: Not on file     Physical Exam   Vitals:   03/30/23 2031  BP: (!) 135/91  Pulse: 72  Resp: 20  Temp: 98.6 F (37 C)  SpO2: 96%    CONSTITUTIONAL: Well-appearing, NAD NEURO/PSYCH:  Alert and oriented x 3, no focal deficits EYES:  eyes equal and reactive ENT/NECK:  no LAD, no JVD CARDIO: Regular rate, well-perfused, normal S1 and S2 PULM:  CTAB no wheezing or rhonchi GI/GU:  non-distended, non-tender MSK/SPINE:  No gross deformities, no edema SKIN:  no rash, atraumatic   *Additional and/or pertinent findings included in MDM below  Diagnostic and Interventional Summary    EKG Interpretation Date/Time:    Ventricular Rate:    PR Interval:    QRS Duration:    QT Interval:    QTC Calculation:   R Axis:      Text Interpretation:         Labs Reviewed  LIPASE, BLOOD - Abnormal; Notable for the following components:      Result Value  Lipase 106 (*)    All other components within normal limits  COMPREHENSIVE METABOLIC PANEL - Abnormal; Notable for the following components:   Glucose, Bld 103 (*)    All other components within normal limits  CBC - Abnormal; Notable for the following components:   WBC 11.6 (*)    All other components within normal limits  URINALYSIS, ROUTINE W REFLEX MICROSCOPIC    CT Angio Abd/Pel W and/or Wo Contrast  Final Result      Medications  iohexol (OMNIPAQUE) 350 MG/ML injection 100 mL (100 mLs Intravenous Contrast Given 03/31/23 0103)     Procedures  /  Critical Care Procedures  ED Course and Medical Decision Making  Initial Impression and Ddx Differential diagnosis includes symptomatic AAA, GERD, pancreatitis  Past medical/surgical history that increases complexity of ED encounter: MMM  Interpretation of  Diagnostics I personally reviewed the laboratory assessment and my interpretation is as follows: No significant blood count or electrolyte disturbance.  Lipase minimally elevated  CT is normal.  Patient Reassessment and Ultimate Disposition/Management     Patient is sleeping comfortably, wakes easily, normal vital signs, soft abdomen, reassuring workup, appropriate for discharge.  Patient management required discussion with the following services or consulting groups:  None  Complexity of Problems Addressed Acute illness or injury that poses threat of life of bodily function  Additional Data Reviewed and Analyzed Further history obtained from: Prior labs/imaging results  Additional Factors Impacting ED Encounter Risk Prescriptions  Elmer Sow. Pilar Plate, MD [facility] Emergency Medicine [facility] [email or web address]  Final Clinical Impressions(s) / ED Diagnoses     ICD-10-CM   1. Abdominal pain, unspecified abdominal location  R10.9       ED Discharge Orders          Ordered    sucralfate (CARAFATE) 1 g tablet  4 times daily PRN        03/31/23 0238    omeprazole (PRILOSEC) 20 MG capsule  Daily        03/31/23 0238             Discharge Instructions Discussed with and Provided to Patient:    Discharge Instructions      You were evaluated in the Emergency Department and after careful evaluation, we did not find any emergent condition requiring admission or further testing in the hospital.  Your exam/testing today is overall reassuring.  Recommend following up on your abdominal aortic aneurysm with your regular doctor.  Take the omeprazole daily to help prevent pain, use the Carafate medication as needed for immediate relief.  Please return to the Emergency Department if you experience any worsening of your condition.   Thank you for allowing [place] to be a part of your care.      Sabas Sous, MD 03/31/23 [phone]

## 2023-12-23 ENCOUNTER — Other Ambulatory Visit: Payer: Self-pay

## 2023-12-23 ENCOUNTER — Encounter (HOSPITAL_COMMUNITY): Payer: Self-pay | Admitting: Emergency Medicine

## 2023-12-23 ENCOUNTER — Emergency Department (HOSPITAL_COMMUNITY)
Admission: EM | Admit: 2023-12-23 | Discharge: 2023-12-23 | Discharge status: Against Medical Advice | Attending: Emergency Medicine | Admitting: Emergency Medicine

## 2023-12-23 DIAGNOSIS — Z5321 Procedure and treatment not carried out due to patient leaving prior to being seen by health care provider: Secondary | ICD-10-CM | POA: Diagnosis not present

## 2023-12-23 DIAGNOSIS — M546 Pain in thoracic spine: Secondary | ICD-10-CM | POA: Insufficient documentation

## 2023-12-23 NOTE — ED Triage Notes (Signed)
 Pt c/o middle back pain since Saturday. Pt states he sneezed and threw his back out.

## 2024-06-15 LAB — COLOGUARD

## 2024-08-27 ENCOUNTER — Encounter (HOSPITAL_COMMUNITY): Payer: Self-pay

## 2024-08-27 ENCOUNTER — Emergency Department (HOSPITAL_COMMUNITY)

## 2024-08-27 ENCOUNTER — Other Ambulatory Visit: Payer: Self-pay

## 2024-08-27 ENCOUNTER — Emergency Department (HOSPITAL_COMMUNITY)
Admission: EM | Admit: 2024-08-27 | Discharge: 2024-08-27 | Disposition: A | Discharge status: Home / Self Care | Attending: Emergency Medicine | Admitting: Emergency Medicine

## 2024-08-27 DIAGNOSIS — R109 Unspecified abdominal pain: Secondary | ICD-10-CM | POA: Insufficient documentation

## 2024-08-27 DIAGNOSIS — Z9049 Acquired absence of other specified parts of digestive tract: Secondary | ICD-10-CM | POA: Insufficient documentation

## 2024-08-27 DIAGNOSIS — R10A1 Flank pain, right side: Secondary | ICD-10-CM | POA: Insufficient documentation

## 2024-08-27 DIAGNOSIS — K760 Fatty (change of) liver, not elsewhere classified: Secondary | ICD-10-CM | POA: Insufficient documentation

## 2024-08-27 DIAGNOSIS — I700 Atherosclerosis of aorta: Secondary | ICD-10-CM | POA: Insufficient documentation

## 2024-08-27 DIAGNOSIS — R911 Solitary pulmonary nodule: Secondary | ICD-10-CM | POA: Insufficient documentation

## 2024-08-27 DIAGNOSIS — N281 Cyst of kidney, acquired: Secondary | ICD-10-CM | POA: Insufficient documentation

## 2024-08-27 LAB — CBC
HCT: 51 % (ref 39.0–52.0)
Hemoglobin: 16.3 g/dL (ref 13.0–17.0)
MCH: 30.4 pg (ref 26.0–34.0)
MCHC: 32 g/dL (ref 30.0–36.0)
MCV: 95.1 fL (ref 80.0–100.0)
Platelets: 338 K/uL (ref 150–400)
RBC: 5.36 MIL/uL (ref 4.22–5.81)
RDW: 14.4 % (ref 11.5–15.5)
WBC: 11.2 K/uL — ABNORMAL HIGH (ref 4.0–10.5)
nRBC: 0 % (ref 0.0–0.2)

## 2024-08-27 LAB — COMPREHENSIVE METABOLIC PANEL WITH GFR
ALT: 33 U/L (ref 0–44)
AST: 25 U/L (ref 15–41)
Albumin: 4.3 g/dL (ref 3.5–5.0)
Alkaline Phosphatase: 84 U/L (ref 38–126)
Anion gap: 14 (ref 5–15)
BUN: 7 mg/dL (ref 6–20)
CO2: 21 mmol/L — ABNORMAL LOW (ref 22–32)
Calcium: 9 mg/dL (ref 8.9–10.3)
Chloride: 106 mmol/L (ref 98–111)
Creatinine, Ser: 0.94 mg/dL (ref 0.61–1.24)
GFR, Estimated: >60 mL/min (ref >60)
Glucose, Bld: 117 mg/dL — ABNORMAL HIGH (ref 70–99)
Potassium: 4.5 mmol/L (ref 3.5–5.1)
Sodium: 140 mmol/L (ref 135–145)
Total Bilirubin: 0.4 mg/dL (ref 0.0–1.2)
Total Protein: 6.8 g/dL (ref 6.5–8.1)

## 2024-08-27 LAB — LIPASE, BLOOD: Lipase: 25 U/L (ref 11–51)

## 2024-08-27 LAB — URINALYSIS, ROUTINE W REFLEX MICROSCOPIC
Bilirubin Urine: NEGATIVE
Glucose, UA: NEGATIVE mg/dL
Hgb urine dipstick: NEGATIVE
Ketones, ur: NEGATIVE mg/dL
Leukocytes,Ua: NEGATIVE
Nitrite: NEGATIVE
Protein, ur: NEGATIVE mg/dL
Specific Gravity, Urine: 1.014 (ref 1.005–1.030)
pH: 7 (ref 5.0–8.0)

## 2024-08-27 MED ORDER — METHOCARBAMOL 500 MG PO TABS: 500.0000 mg | ORAL_TABLET | Freq: Two times a day (BID) | ORAL | 0 refills | Status: AC

## 2024-08-27 MED ORDER — MORPHINE SULFATE (PF) 4 MG/ML IV SOLN
4.0000 mg | Freq: Once | INTRAVENOUS | Status: AC
  Administered 2024-08-27: 4 mg via INTRAVENOUS
  Filled 2024-08-27: qty 1

## 2024-08-27 MED ORDER — MELOXICAM 7.5 MG PO TABS: 15.0000 mg | ORAL_TABLET | Freq: Every day | ORAL | 0 refills | Status: AC

## 2024-08-27 NOTE — Discharge Instructions (Signed)
 Follow-up with your primary care doctor for pulmonary nodule.  If pain continues follow-up with primary care.  If pain worsens return to the ER.

## 2024-08-27 NOTE — ED Triage Notes (Signed)
 Pt arrived via POV c/o right flank pain that began last night. Pt reports pain radiates to his groin and originates in his back. Pt denies hematuria, denies N/V.

## 2024-08-27 NOTE — ED Notes (Signed)
 2nd request for urine sample and pt was advised of possible bladder scan and craterization for inability to void.

## 2024-08-27 NOTE — ED Provider Notes (Signed)
 This patient is a 53 year old male presenting with abdominal discomfort flank discomfort radiating into the groin but on exam has a completely nontender abdomen, no Murphy sign, no right lower quadrant tenderness, no rash on the back, does not appear to be zoster.  He has had prior lumbar surgery, scar is old and intact from 2006.  Vitals unremarkable, check urine labs CT renal, patient agreeable.   Cleotilde Rogue, MD 08/30/24 [phone]

## 2024-08-27 NOTE — ED Provider Notes (Signed)
 [place] EMERGENCY DEPARTMENT AT [facility] Provider Note   CSN: [id number] Arrival date & time: 08/27/24  9083     Patient presents with: Abdominal Pain   Tim A Mattie is a 53 y.o. male.    Abdominal Pain  53 year old male presenting with presenting with right flank pain.  She reports that this started about 2 AM last night.  Where he had pain on both the right and left flank.  He got up and walked around and the left side started to diminish.  He went back to bed and at about 4 AM was woken up with pain again on the right side.  He reports that it feels like it is in his right flank wrapping around into his groin.  He took some aspirin for the pain and it did not help.  He reports it is a 10 out of 10 pain and feels like a hot knife stabbing him.  He denies any dysuria, increasing frequency, or blood.  He denies any abdominal pain, chest pain, or shortness of breath.  He tried throwing up to make it feel better however did not throw up and is not feeling nauseous at this time.  He does not have a history of kidney stones.  He does have a positive history for a AAA.  This is being monitored and his last CT was done 03/30/2023 and was measured at 2.8 cm.     Prior to Admission medications  Medication Sig Start Date End Date Taking? Authorizing Provider  meloxicam  (MOBIC ) 7.5 MG tablet Take 2 tablets (15 mg total) by mouth daily. 08/27/24  Yes Rosaline Almarie MATSU, PA-C  methocarbamol  (ROBAXIN ) 500 MG tablet Take 1 tablet (500 mg total) by mouth 2 (two) times daily. 08/27/24  Yes Rosaline Almarie MATSU, PA-C  acetaminophen  (TYLENOL ) 500 MG tablet Take 500 mg by mouth as needed.    [provider]  aspirin EC 81 MG tablet Take 81 mg by mouth daily. Swallow whole.    [provider]  etodolac  (LODINE ) 400 MG tablet Take 1 tablet (400 mg total) by mouth 2 (two) times daily. 02/20/23   Hildegard Loge, PA-C  ibuprofen  (ADVIL ) 600 MG tablet Take 1 tablet (600 mg total) by  mouth every 6 (six) hours as needed. 06/10/22   Theotis Peers M, PA-C  lidocaine  (LIDODERM ) 5 % Place 1 patch onto the skin daily. Remove & Discard patch within 12 hours or as directed by MD 02/20/23   Hildegard Loge, PA-C  omeprazole  (PRILOSEC) 20 MG capsule Take 1 capsule (20 mg total) by mouth daily. 03/31/23   Theadore Ozell HERO, MD  oxyCODONE  (ROXICODONE ) 15 MG immediate release tablet Take 15 mg by mouth 2 (two) times daily. 05/10/22   [provider]  oxyCODONE -acetaminophen  (PERCOCET/ROXICET) 5-325 MG tablet Take 1 tablet by mouth every 6 (six) hours as needed for severe pain. 07/28/22   Suzette Pac, MD  sucralfate  (CARAFATE ) 1 g tablet Take 1 tablet (1 g total) by mouth 4 (four) times daily as needed. 03/31/23   Theadore Ozell HERO, MD  dicyclomine  (BENTYL ) 20 MG tablet Take 1 tablet (20 mg total) by mouth 2 (two) times daily. Patient not taking: Reported on 10/07/2018 06/05/18 04/03/19  Mavis Anes, MD    Allergies: Lyrica [pregabalin] and Toradol [ketorolac tromethamine]    Review of Systems  Genitourinary:  Positive for flank pain.  All other systems reviewed and are negative.   Updated Vital Signs BP (!) 138/114  Pulse 69   Temp 97.6 F (36.4 C) (Temporal)   Resp 16   Ht 6' 1 (1.854 m)   Wt 82.6 kg   SpO2 94%   BMI 24.03 kg/m   Physical Exam Vitals and nursing note reviewed.  HENT:     Mouth/Throat:     Pharynx: Oropharynx is clear.  Cardiovascular:     Rate and Rhythm: Normal rate.     Pulses: Normal pulses.  Pulmonary:     Effort: Pulmonary effort is normal.     Breath sounds: Normal breath sounds.  Abdominal:     General: Abdomen is flat. Bowel sounds are normal.     Palpations: Abdomen is soft.     Tenderness: There is right CVA tenderness.     Comments: No pain to palpation of the left flank area.  Pain to palpation over right flank.  No abdominal pain to palpation.  Sitting straight up seems to help the pain.  Skin:    General: Skin is warm and dry.   Neurological:     General: No focal deficit present.     Mental Status: He is alert.     (all labs ordered are listed, but only abnormal results are displayed) Labs Reviewed  COMPREHENSIVE METABOLIC PANEL WITH GFR - Abnormal; Notable for the following components:      Result Value   CO2 21 (*)    Glucose, Bld 117 (*)    All other components within normal limits  CBC - Abnormal; Notable for the following components:   WBC 11.2 (*)    All other components within normal limits  LIPASE, BLOOD  URINALYSIS, ROUTINE W REFLEX MICROSCOPIC    EKG: None  Radiology: CT Renal Stone Study Result Date: 08/27/2024 EXAM: CT Abdomen and Pelvis Without Intravenous Contrast CLINICAL HISTORY: Right flank pain starting 1 day prior to imaging. Stone suspected. TECHNIQUE: Axial computed tomography images of the abdomen and pelvis without intravenous contrast. Dose reduction technique was used including one or more of the following: automated exposure control, adjustment of mA and kV according to patient size, and/or iterative reconstruction. CONTRAST: NONE COMPARISON: 03/31/2023 FINDINGS: LUNG BASES: 3 mm right lower lobe nodule on image 5 series 4. Mild scarring in the posterior basal segment right lower lobe. No basilar airspace consolidation or pleural effusion. LIVER: Diffuse hepatic steatosis. GALLBLADDER AND BILE DUCTS: Cholecystectomy. No ductal dilation. PANCREAS: Unremarkable. SPLEEN: Unremarkable. ADRENAL GLANDS: Unremarkable. KIDNEYS, URETERS, AND BLADDER: Subtle calcification thought to be along the margin of a left mid kidney cyst, favored to be Bosniak category 2 (benign). Per consensus, no follow-up is needed for simple Bosniak type 1 and 2 renal cysts, unless the patient has a malignancy history or risk factors. No hydronephrosis or nephrolithiasis. No ureteral or bladder calculi. STOMACH AND BOWEL: No obstruction. No wall thickening. No CT evidence of colitis or acute diverticulitis. Normal  appendix. APPENDIX: No CT evidence for appendicitis. PERITONEUM: No free fluid. No free air. LYMPH NODES: No lymphadenopathy. REPRODUCTIVE: Unremarkable as visualized. VASCULATURE: Systemic atherosclerosis is present, including the aorta and iliac arteries. No aortic aneurysm. ABDOMINAL WALL AND SOFT TISSUES: Unremarkable. BONES: Posterolateral rod and pedicle screw fixation at L5-S1 with posterior decompression and interbody spacer. Moderate left foraminal stenosis at L4-L5 due to facet arthropathy. Grade 1 degenerative retrolisthesis at L2-L3, L3-L4, and L4-L5. No fracture or suspicious osseous abnormality. IMPRESSION: 1. No acute findings to explain right flank pain. 2. 3 mm right lower lobe pulmonary nodule without high-risk features; no routine follow-up imaging  is recommended per Fleischner Society Guidelines. 3. Diffuse hepatic steatosis. 4. Status post cholecystectomy. 5. Left mid renal cyst favored Bosniak II (benign) with subtle peripheral calcification; no follow-up imaging is recommended. 6. Systemic atherosclerosis involving the aorta and iliac arteries. 7. Postoperative changes at L5-S1 with posterolateral rod and pedicle screw fixation, posterior decompression, and interbody spacer. 8. Degenerative changes: moderate left foraminal stenosis at L4-L5 due to facet arthropathy; grade 1 degenerative retrolisthesis at L2-L3, L3-L4, and L4-L5. 9. Mild scarring in the posterior basal segment of the right lower lobe. Electronically signed by: Ryan Salvage MD 08/27/2024 11:05 AM EST RP Workstation: [id number]     Procedures   Medications Ordered in the ED  morphine  (PF) 4 MG/ML injection 4 mg (4 mg Intravenous Given 08/27/24 1011)                                    Medical Decision Making Amount and/or Complexity of Data Reviewed Labs: ordered. Radiology: ordered.  Risk Prescription drug management.   Impression: 53 year old male with flank pain.  Differentials include kidney stone,  hydronephrosis, pyelonephrosis, UTI, AAA, muscle strain  Additional History: Patient provided history.  Reviewed previous outpatient visits and imaging.  Labs: Urinalysis was ordered to check for any UTI and it showed showed no signs of infection or dehydration.   Imaging: CT renal was ordered to check for kidney stones and showed no signs of kidney stones, hydronephrosis, or pyelonephrosis.  ED Course/Meds: While here he received morphine  for pain which he said improved his pain.  On CAT a pulmonary nodule was noted and patient was educated on following up with PCP for further eval of this.  Patient's vitals remained stable while in the ER.  He was informed that if pain were to worsen he should come back.  If pain is to states the same with medication follow-up with PCP.      Final diagnoses:  Right flank pain  Pulmonary nodule    ED Discharge Orders          Ordered    meloxicam  (MOBIC ) 7.5 MG tablet  Daily        08/27/24 1125    methocarbamol  (ROBAXIN ) 500 MG tablet  2 times daily        08/27/24 1125               Rosaline Almarie KANDICE DEVONNA 08/27/24 1129    Cleotilde Rogue, MD 08/30/24 1002

## 2024-08-27 NOTE — ED Notes (Signed)
 Requested urine sample from pt. Urinal is at bedside
